# Patient Record
Sex: Female | Born: 1955 | Race: Asian | Hispanic: No | Marital: Married | Smoking: Never smoker
Health system: Southern US, Community
[De-identification: ages and names within clinical notes are randomized; demographics above are authoritative.]

## PROBLEM LIST (undated history)

## (undated) DIAGNOSIS — R51 Headache: Secondary | ICD-10-CM

## (undated) DIAGNOSIS — R7989 Other specified abnormal findings of blood chemistry: Secondary | ICD-10-CM

## (undated) DIAGNOSIS — E871 Hypo-osmolality and hyponatremia: Secondary | ICD-10-CM

## (undated) DIAGNOSIS — E785 Hyperlipidemia, unspecified: Secondary | ICD-10-CM

## (undated) DIAGNOSIS — Z789 Other specified health status: Secondary | ICD-10-CM

## (undated) DIAGNOSIS — S4990XA Unspecified injury of shoulder and upper arm, unspecified arm, initial encounter: Secondary | ICD-10-CM

## (undated) DIAGNOSIS — E119 Type 2 diabetes mellitus without complications: Secondary | ICD-10-CM

## (undated) DIAGNOSIS — R519 Headache, unspecified: Secondary | ICD-10-CM

## (undated) HISTORY — DX: Headache, unspecified: R51.9

## (undated) HISTORY — DX: Hemochromatosis, unspecified: E83.119

## (undated) HISTORY — DX: Other specified abnormal findings of blood chemistry: R79.89

## (undated) HISTORY — DX: Unspecified injury of shoulder and upper arm, unspecified arm, initial encounter: S49.90XA

## (undated) HISTORY — DX: Type 2 diabetes mellitus without complications: E11.9

## (undated) HISTORY — DX: Hypo-osmolality and hyponatremia: E87.1

## (undated) HISTORY — DX: Headache: R51

## (undated) HISTORY — DX: Hyperlipidemia, unspecified: E78.5

## (undated) HISTORY — PX: ANKLE SURGERY: SHX546

---

## 2015-01-13 ENCOUNTER — Encounter: Payer: Self-pay | Admitting: Emergency Medicine

## 2015-01-13 ENCOUNTER — Observation Stay
Admission: EM | Admit: 2015-01-13 | Discharge: 2015-01-15 | Disposition: A | Discharge status: Home / Self Care | Payer: Medicaid Other | Attending: Internal Medicine | Admitting: Internal Medicine

## 2015-01-13 ENCOUNTER — Emergency Department: Payer: Self-pay

## 2015-01-13 DIAGNOSIS — Z79899 Other long term (current) drug therapy: Secondary | ICD-10-CM | POA: Insufficient documentation

## 2015-01-13 DIAGNOSIS — R51 Headache: Secondary | ICD-10-CM | POA: Insufficient documentation

## 2015-01-13 DIAGNOSIS — R55 Syncope and collapse: Secondary | ICD-10-CM | POA: Insufficient documentation

## 2015-01-13 DIAGNOSIS — G8929 Other chronic pain: Secondary | ICD-10-CM | POA: Insufficient documentation

## 2015-01-13 DIAGNOSIS — Z7982 Long term (current) use of aspirin: Secondary | ICD-10-CM | POA: Insufficient documentation

## 2015-01-13 DIAGNOSIS — M25512 Pain in left shoulder: Secondary | ICD-10-CM | POA: Insufficient documentation

## 2015-01-13 DIAGNOSIS — R42 Dizziness and giddiness: Secondary | ICD-10-CM | POA: Insufficient documentation

## 2015-01-13 DIAGNOSIS — R531 Weakness: Principal | ICD-10-CM | POA: Insufficient documentation

## 2015-01-13 DIAGNOSIS — Z9109 Other allergy status, other than to drugs and biological substances: Secondary | ICD-10-CM | POA: Insufficient documentation

## 2015-01-13 DIAGNOSIS — R52 Pain, unspecified: Secondary | ICD-10-CM

## 2015-01-13 DIAGNOSIS — M549 Dorsalgia, unspecified: Secondary | ICD-10-CM | POA: Insufficient documentation

## 2015-01-13 DIAGNOSIS — I639 Cerebral infarction, unspecified: Secondary | ICD-10-CM | POA: Diagnosis present

## 2015-01-13 DIAGNOSIS — Z880 Allergy status to penicillin: Secondary | ICD-10-CM | POA: Insufficient documentation

## 2015-01-13 DIAGNOSIS — W19XXXA Unspecified fall, initial encounter: Secondary | ICD-10-CM | POA: Insufficient documentation

## 2015-01-13 DIAGNOSIS — M79602 Pain in left arm: Secondary | ICD-10-CM | POA: Insufficient documentation

## 2015-01-13 HISTORY — DX: Other specified health status: Z78.9

## 2015-01-13 LAB — CBC WITH DIFFERENTIAL/PLATELET
BASOS PCT: 0 %
Basophils Absolute: 0 10*3/uL (ref 0–0.1)
EOS PCT: 2 %
Eosinophils Absolute: 0.1 10*3/uL (ref 0–0.7)
HCT: 44.1 % (ref 35.0–47.0)
Hemoglobin: 14.6 g/dL (ref 12.0–16.0)
LYMPHS PCT: 38 %
Lymphs Abs: 2.4 10*3/uL (ref 1.0–3.6)
MCH: 30.8 pg (ref 26.0–34.0)
MCHC: 33.1 g/dL (ref 32.0–36.0)
MCV: 93 fL (ref 80.0–100.0)
Monocytes Absolute: 0.5 10*3/uL (ref 0.2–0.9)
Monocytes Relative: 8 %
NEUTROS PCT: 52 %
Neutro Abs: 3.3 10*3/uL (ref 1.4–6.5)
Platelets: 149 10*3/uL — ABNORMAL LOW (ref 150–440)
RBC: 4.74 MIL/uL (ref 3.80–5.20)
RDW: 12.7 % (ref 11.5–14.5)
WBC: 6.3 10*3/uL (ref 3.6–11.0)

## 2015-01-13 LAB — URINALYSIS COMPLETE WITH MICROSCOPIC (ARMC ONLY)
BACTERIA UA: NONE SEEN
BILIRUBIN URINE: NEGATIVE
GLUCOSE, UA: NEGATIVE mg/dL
HGB URINE DIPSTICK: NEGATIVE
KETONES UR: NEGATIVE mg/dL
Leukocytes, UA: NEGATIVE
NITRITE: NEGATIVE
Protein, ur: NEGATIVE mg/dL
Specific Gravity, Urine: 1.017 (ref 1.005–1.030)
pH: 6 (ref 5.0–8.0)

## 2015-01-13 LAB — COMPREHENSIVE METABOLIC PANEL
ALT: 23 U/L (ref 14–54)
AST: 25 U/L (ref 15–41)
Albumin: 4.2 g/dL (ref 3.5–5.0)
Alkaline Phosphatase: 120 U/L (ref 38–126)
Anion gap: 8 (ref 5–15)
BUN: 11 mg/dL (ref 6–20)
CO2: 27 mmol/L (ref 22–32)
Calcium: 9.4 mg/dL (ref 8.9–10.3)
Chloride: 107 mmol/L (ref 101–111)
Creatinine, Ser: 0.67 mg/dL (ref 0.44–1.00)
GFR calc Af Amer: >60 mL/min (ref >60)
Glucose, Bld: 85 mg/dL (ref 65–99)
Potassium: 3.6 mmol/L (ref 3.5–5.1)
Sodium: 142 mmol/L (ref 135–145)
Total Bilirubin: 0.8 mg/dL (ref 0.3–1.2)
Total Protein: 7.3 g/dL (ref 6.5–8.1)

## 2015-01-13 LAB — TROPONIN I

## 2015-01-13 MED ORDER — SIMVASTATIN 20 MG PO TABS
20.0000 mg | ORAL_TABLET | Freq: Every day | ORAL | Status: DC
  Administered 2015-01-13 – 2015-01-14 (×2): 20 mg via ORAL
  Filled 2015-01-13 (×2): qty 1

## 2015-01-13 MED ORDER — ASPIRIN 81 MG PO CHEW
CHEWABLE_TABLET | ORAL | Status: AC
  Administered 2015-01-13: 324 mg via ORAL
  Filled 2015-01-13: qty 4

## 2015-01-13 MED ORDER — ASPIRIN EC 81 MG PO TBEC
81.0000 mg | DELAYED_RELEASE_TABLET | Freq: Every day | ORAL | Status: DC
  Administered 2015-01-14 – 2015-01-15 (×2): 81 mg via ORAL
  Filled 2015-01-13 (×2): qty 1

## 2015-01-13 MED ORDER — ONDANSETRON HCL 4 MG PO TABS: 4.0000 mg | ORAL_TABLET | Freq: Four times a day (QID) | ORAL | Status: DC | PRN

## 2015-01-13 MED ORDER — ACETAMINOPHEN 325 MG PO TABS
650.0000 mg | ORAL_TABLET | Freq: Four times a day (QID) | ORAL | Status: DC | PRN
  Administered 2015-01-14 – 2015-01-15 (×2): 650 mg via ORAL
  Filled 2015-01-13 (×2): qty 2

## 2015-01-13 MED ORDER — ASPIRIN 81 MG PO CHEW
324.0000 mg | CHEWABLE_TABLET | Freq: Once | ORAL | Status: AC
  Administered 2015-01-13: 324 mg via ORAL

## 2015-01-13 MED ORDER — ONDANSETRON HCL 4 MG/2ML IJ SOLN: 4.0000 mg | Freq: Four times a day (QID) | INTRAMUSCULAR | Status: DC | PRN

## 2015-01-13 MED ORDER — ACETAMINOPHEN 650 MG RE SUPP: 650.0000 mg | Freq: Four times a day (QID) | RECTAL | Status: DC | PRN

## 2015-01-13 NOTE — ED Notes (Signed)
Fell last pm,, unknown reason, no visual changes states was walking in house and passed out, her husband states he found her on the floor, she was sleeping and he had to wake her up, pt states has felt tired past few days, also has pain on left side of her head and face, some pain left lateral rib but non tender, had to use language line for assessment

## 2015-01-13 NOTE — ED Provider Notes (Addendum)
[facility] Emergency Department Provider Note  ____________________________________________  Time seen: 1235  I have reviewed the triage vital signs and the nursing notes.   HISTORY  Chief Complaint Headache  history is obtained via language line.  HPI Tammy Lam is a 59 y.o. female patient is brought in today by husband with complaint of left-sided headache. Through the language line history was obtained. Patient states she was walking through the house and suddenly everything became black. Husband found her lying on the floor on her side.She states prior to this she had felt weak for 3 or 4 days. He does not have a primary care doctor as she is only been in this area [place]in [place] 2 [place]. She denies any vision changes. She states that the left side of her face feels different and that her left hand and left leg feel weak. She denies any nausea or vomiting. She has not taken any medication over-the-counter at home since her headache began. She denies any health problems. She denies any family history of health problems such as stroke. She denies any previous headaches such as what she is experiencing currently. Currently she rates her headache as an 8 out of 10.   History reviewed. No pertinent past medical history.  There are no active problems to display for this patient.   No past surgical history on file.  No current outpatient prescriptions on file.  Allergies Benadryl  No family history on file.  Social History History  Substance Use Topics  . Smoking status: Never Smoker   . Smokeless tobacco: Not on file  . Alcohol Use: No    Review of Systems Constitutional: No fever/chills Eyes: No visual changes. ENT: No sore throat. Cardiovascular: Denies chest pain. Respiratory: Denies shortness of breath. Gastrointestinal: No abdominal pain.  No nausea, no vomiting.  No diarrhea. . Genitourinary: Negative for dysuria. Musculoskeletal:  Negative for back pain. Positive left hip pain Skin: Negative for rash. Neurological: Positive left side  headaches, focal weakness positive left side and numbness left upper and lower extremity.  10-point ROS otherwise negative.  ____________________________________________   PHYSICAL EXAM:  VITAL SIGNS: ED Triage Vitals  Enc Vitals Group     BP 01/13/15 1109 122/76 mmHg     Pulse Rate 01/13/15 1109 62     Resp 01/13/15 1109 18     Temp 01/13/15 1109 98.5 F (36.9 C)     Temp Source 01/13/15 1109 Oral     SpO2 01/13/15 1109 98 %     Weight 01/13/15 1109 135 lb (61.236 kg)     Height 01/13/15 1109 5\' 5"  (1.651 m)     Head Cir --      Peak Flow --      Pain Score 01/13/15 1110 8     Pain Loc --      Pain Edu? --      Excl. in GC? --     Constitutional: Alert and oriented. Well appearing and in no acute distress. Eyes: Conjunctivae are normal. PERRL. EOMI. Head: Atraumatic. Nose: No congestion/rhinnorhea. Neck: No stridor.  Nontender Cervical spine on palpation Hematological/Lymphatic/Immunilogical: No cervical lymphadenopathy. Cardiovascular: Normal rate, regular rhythm. Grossly normal heart sounds.  Good peripheral circulation. Respiratory: Normal respiratory effort.  No retractions. Lungs CTAB. Nontender to palpation bilateral ribs,  no ecchymosis noted Gastrointestinal: Soft and nontender. No distention. No abdominal bruits. No CVA tenderness. Musculoskeletal: Left hip tender lateral aspect, no gross deformity is noted. Range of motion is  restricted secondary to pain, no ecchymosis is noted  No joint effusions. Moves upper extremities without restriction Neurologic:  Normal speech and language. Cranial nerves II through XII grossly intact with the exception of patient's small left side is not symmetrical. Speech is normal. Gait was not tested. Patient answers questions and answers appropriately Skin:  Skin is warm, dry and intact. No rash noted. Psychiatric: Mood and  affect are normal. Speech and behavior are normal.  ____________________________________________   LABS (all labs ordered are listed, but only abnormal results are displayed)  Labs Reviewed  COMPREHENSIVE METABOLIC PANEL  CBC WITH DIFFERENTIAL/PLATELET  URINALYSIS COMPLETEWITH MICROSCOPIC ([facility])   TROPONIN I   ____________________________________________  EKG  EKG interpreted by me, Dr. Sharman Cheek Normal sinus rhythm rate of 67 normal axis intervals QRS ST and T segments ____________________________________________  RADIOLOGY  CT scan head shows no acute intracranial abnormality per radiologist X-ray left hip unremarkable ____________________________________________   PROCEDURES  Procedure(s) performed: None  Critical Care performed: No  ____________________________________________   INITIAL IMPRESSION / ASSESSMENT AND PLAN / ED COURSE  Pertinent labs & imaging results that were available during my care of the patient were reviewed by me and considered in my medical decision making (see chart for details).  Initial impression r/o  CVA . Patient was transferred to major for further evaluation, discussed with Dr. Scotty Court ____________________________________________   FINAL CLINICAL IMPRESSION(S) / ED DIAGNOSES  Final diagnoses:  None      Tommi Rumps, PA-C 01/13/15 1338  Sharman Cheek, MD 01/13/15 1438  Sharman Cheek, MD 01/13/15 [phone]

## 2015-01-13 NOTE — ED Notes (Signed)
Mechanical fall last week, hit head on floor, began headache and dizzyness last night

## 2015-01-13 NOTE — H&P (Signed)
[facility] [place] [facility]   PATIENT NAME: Tammy Lam    MR#:  [id number]  DATE OF BIRTH:  16-Nov-1955  DATE OF ADMISSION:  01/13/2015  PRIMARY CARE PHYSICIAN: No primary care provider on file.   REQUESTING/REFERRING PHYSICIAN: Dr. Scotty Court  CHIEF COMPLAINT:   Chief Complaint  Patient presents with  . Headache    fell one week [place] and hit head, headache started last nigh, possible LOC with original fall   Headache for a few days and s/p fall/syncope today with left sided weakness.    HISTORY OF PRESENT ILLNESS:  Tailey Top  is a 59 y.o. [place] female with no past medical history who presents to the hospital after a fall at home. As per the husband who gives most of the history patient was in usual state of health but developed some headache over the past couple days. Patient also has been having some weakness to her left side for the past couple days. Patient this morning was ambulating and felt dizzy and then fell. As per the husband patient was not arousable for about 3-4 minutes although was breathing. She was brought to the emergency room for further evaluation patient continues to have some left-sided tingling and weakness and suspected to have a CVA and therefore is being admitted for further evaluation.  Patient complains of a headache which is nonradiating in the center of her head.  Patient did have some associated nausea but did not vomit.  PAST MEDICAL HISTORY:  History reviewed. No pertinent past medical history.  PAST SURGICAL HISTORY:  No past surgical history on file.  SOCIAL HISTORY:   History  Substance Use Topics  . Smoking status: Never Smoker   . Smokeless tobacco: Not on file  . Alcohol Use: No    FAMILY HISTORY:   Father died during the War.  Mother is alive and healthy.  No family hx of CVA, heart disease or cancer.   DRUG ALLERGIES:   Allergies  Allergen Reactions  . Benadryl [Diphenhydramine Hcl] Itching  .  Penicillins Itching    REVIEW OF SYSTEMS:   Review of Systems  Constitutional: Negative for fever and weight loss.  HENT: Negative for congestion, nosebleeds and tinnitus.   Eyes: Negative for blurred vision, double vision and redness.  Respiratory: Negative for cough, hemoptysis and shortness of breath.   Cardiovascular: Negative for chest pain, orthopnea, leg swelling and PND.  Gastrointestinal: Negative for nausea, vomiting, abdominal pain, diarrhea and melena.  Genitourinary: Negative for dysuria, urgency and hematuria.  Musculoskeletal: Negative for joint pain and falls.  Neurological: Positive for focal weakness (left sided) and headaches. Negative for dizziness, tingling, sensory change, speech change, seizures and weakness.  Endo/Heme/Allergies: Negative for polydipsia. Does not bruise/bleed easily.  Psychiatric/Behavioral: Negative for depression and memory loss. The patient is not nervous/anxious.     MEDICATIONS AT HOME:   Prior to Admission medications   Not on File    Pt. Is no medications presently.    VITAL SIGNS:  Blood pressure 119/72, pulse 70, temperature 98.5 F (36.9 C), temperature source Oral, resp. rate 18, height  (1.651 m), weight 61.236 kg (135 lb), SpO2 98 %.  PHYSICAL EXAMINATION:  Physical Exam  GENERAL:  59 y.o.-year-old patient lying in the bed with no acute distress.  EYES: Pupils equal, round, reactive to light and accommodation. No scleral icterus. Extraocular muscles intact.  HEENT: Head atraumatic, normocephalic. Oropharynx and nasopharynx clear. No oropharyngeal erythema, moist oral mucosa  NECK:  Supple, no jugular venous distention. No thyroid enlargement, no tenderness.  LUNGS: Normal breath sounds bilaterally, no wheezing, rales, rhonchi. No use of accessory muscles of respiration.  CARDIOVASCULAR: S1, S2 normal. No murmurs, rubs, or gallops.  ABDOMEN: Soft, nontender, nondistended. Bowel sounds present. No organomegaly or mass.   EXTREMITIES: No pedal edema, cyanosis, or clubbing. + 2 pedal & radial pulses b/l.   NEUROLOGIC: Cranial nerves II through XII are intact. Left upper & Lower ext. Weakness.  3/5 strength in LUE.  4/5 Strength in LLE.  (-) babinski's b/l.  Reflexes + 2 b/l.   PSYCHIATRIC: The patient is alert and oriented x 3. Good affect.  SKIN: No obvious rash, lesion, or ulcer.   LABORATORY PANEL:   CBC  Recent Labs Lab 01/13/15 1346  WBC 6.3  HGB 14.6  HCT 44.1  PLT 149*   ------------------------------------------------------------------------------------------------------------------  Chemistries   Recent Labs Lab 01/13/15 1346  NA 142  K 3.6  CL 107  CO2 27  GLUCOSE 85  BUN 11  CREATININE 0.67  CALCIUM 9.4  AST 25  ALT 23  ALKPHOS 120  BILITOT 0.8   ------------------------------------------------------------------------------------------------------------------  Cardiac Enzymes  Recent Labs Lab 01/13/15 1346  TROPONINI <0.03   ------------------------------------------------------------------------------------------------------------------  RADIOLOGY:  Ct Head Wo Contrast  01/13/2015   CLINICAL DATA:  Larey SeatFell 1 week ago and hit head. Headache starting last night.  EXAM: CT HEAD WITHOUT CONTRAST  TECHNIQUE: Contiguous axial images were obtained from the base of the skull through the vertex without contrast.  COMPARISON:  None  FINDINGS: Normal appearance of the intracranial structures. No evidence for acute hemorrhage, mass lesion, midline shift, hydrocephalus or large infarct. No acute bony abnormality. The visualized sinuses are clear.  IMPRESSION: No acute intracranial abnormality.   Electronically Signed   By: Richarda OverlieAdam  Henn M.D.   On: 01/13/2015 11:49   Dg Hip Unilat With Pelvis 2-3 Views Left  01/13/2015   CLINICAL DATA:  Left side weakness  EXAM: LEFT HIP (WITH PELVIS) 2-3 VIEWS  COMPARISON:  None.  FINDINGS: Three views of the left hip submitted. No acute fracture or  subluxation. Bilateral hip joints are symmetrical in appearance.  IMPRESSION: Negative.   Electronically Signed   By: Natasha MeadLiviu  Pop M.D.   On: 01/13/2015 14:09     IMPRESSION AND PLAN:   59 year old [place] female with no significant past medical history who just moved from [place] 2 [place] ago presents to the hospital after a fall and left-sided weakness.  #1 TIA/CVA - this is a suspected diagnosis given the patient's acute neurologic symptoms. On exam she does have some weakness on her left side. - Although his CT head on admission is negative. - I will observe her on telemetry, follow every 4 hours neuro checks, we'll do an MRI of her brain, get a carotid duplex, and check a two-dimensional echocardiogram.   - I will start on a baby aspirin and low-dose statin. - We'll also get a physical therapy evaluation.     All the records are reviewed and case discussed with ED provider. Management plans discussed with the patient, family and they are in agreement.  CODE STATUS: Full   TOTAL TIME TAKING CARE OF THIS PATIENT: 45  minutes.    Houston SirenSAINANI,Dareld Mcauliffe J M.D on 01/13/2015 at 3:11 PM  Between 7am to 6pm - Pager - [phone]  After 6pm go to [email or web address] - password [email or web address] [facility]  [facility]  Office  [phone]  CC: Primary care physician; No  primary care provider on file.

## 2015-01-13 NOTE — ED Notes (Signed)
Patient transported to CT 

## 2015-01-14 ENCOUNTER — Observation Stay
Admit: 2015-01-14 | Discharge: 2015-01-14 | Disposition: A | Discharge status: Home / Self Care | Payer: Self-pay | Attending: Specialist | Admitting: Specialist

## 2015-01-14 ENCOUNTER — Observation Stay: Payer: Self-pay

## 2015-01-14 LAB — CBC
HEMATOCRIT: 43.7 % (ref 35.0–47.0)
HEMOGLOBIN: 14.6 g/dL (ref 12.0–16.0)
MCH: 30.8 pg (ref 26.0–34.0)
MCHC: 33.4 g/dL (ref 32.0–36.0)
MCV: 92.3 fL (ref 80.0–100.0)
Platelets: 152 10*3/uL (ref 150–440)
RBC: 4.74 MIL/uL (ref 3.80–5.20)
RDW: 12.5 % (ref 11.5–14.5)
WBC: 5.2 10*3/uL (ref 3.6–11.0)

## 2015-01-14 LAB — BASIC METABOLIC PANEL
ANION GAP: 8 (ref 5–15)
BUN: 16 mg/dL (ref 6–20)
CALCIUM: 9.5 mg/dL (ref 8.9–10.3)
CO2: 27 mmol/L (ref 22–32)
CREATININE: 0.79 mg/dL (ref 0.44–1.00)
Chloride: 106 mmol/L (ref 101–111)
GFR calc Af Amer: >60 mL/min (ref >60)
GFR calc non Af Amer: >60 mL/min (ref >60)
Glucose, Bld: 93 mg/dL (ref 65–99)
Potassium: 3.9 mmol/L (ref 3.5–5.1)
Sodium: 141 mmol/L (ref 135–145)

## 2015-01-14 NOTE — Progress Notes (Signed)
PT Cancellation Note  Patient Details Name: Harless LittenDiep Baldi MRN: [id number] DOB: 12/20/1955   Cancelled Treatment:    Reason Eval/Treat Not Completed: Patient at procedure or test/unavailable (patient leaving unit for MRI/dopplers; will re-attempt at later time/date as patient available and medically appropriate.)  Mavryk Pino H. Manson PasseyBrown, PT, DPT 01/14/2015, 8:57 AM [phone]

## 2015-01-14 NOTE — Progress Notes (Signed)
*  PRELIMINARY RESULTS* Echocardiogram 2D Echocardiogram has been performed.  Tammy Lam 01/14/2015, 11:27 AM

## 2015-01-14 NOTE — Evaluation (Addendum)
Physical Therapy Evaluation Patient Details Name: Tammy Lam MRN: [id number] DOB: 1955-12-14 Today's Date: 01/14/2015   History of Present Illness  presented to ER wtih headache x2 days status post fall/syncopal episode with L-sided weakness; admitted for acute CVA. Head CT negative for acute change, L hip/pelvis negative for acute injury; MRI pending.  Clinical Impression  Upon evaluation, patient alert and oriented to all information; follows commands and demonstrates good insight/safety awareness.  Mild weakness of L hemi-body evident, strength at least 4/5 throughout with frequent episodes of give-way weakness noted.  Isolated strength vs. functional strength somewhat inconsistent throughout session.  Reports constant paresthesia throughout L UE/LE, but able to localize 100% without extinction.  Good tone and motor control noted; able to utilize bilat UEs to don socks with good bimanual coordination.  Able to complete bed mobility and sit/stand indep; basic transfers and gait (50') with L HHA, cga/min assist; stairs x6 with single rail, cga/min assist.  Patient strongly favoring L LE with noted decrease in stance time/weight acceptance, but no overt buckling or LOB.  Performance improved (in fluidity, symmetry and overall safety) with use of RW.  Do recommend continued use of RW for all mobility at this time; patient in agreement. Would benefit from skilled PT to address above deficits and promote optimal return to PLOF; recommend transition to home with outpatient PT services upon discharge from acute hospitalization.  Patient/family aware of recommendations and in agreement with plan.  [place] interpreter, Colin Benton (ID #[id number]) , with Language Line utilized throughout evaluation.    Follow Up Recommendations Outpatient PT    Equipment Recommendations  Rolling walker with 5" wheels    Recommendations for Other Services       Precautions / Restrictions Precautions Precautions:  Fall Restrictions Weight Bearing Restrictions: No      Mobility  Bed Mobility Overal bed mobility: Independent                Transfers Overall transfer level: Independent                  Ambulation/Gait Ambulation/Gait assistance: Min guard;Min assist Ambulation Distance (Feet): 50 Feet Assistive device: 1 person hand held assist       General Gait Details: partial step through gait pattern with decreased stance time/weight acceptance L LE; decreaed step height/length R LE.  Stairs Stairs: Yes Stairs assistance: Min assist Stair Management: One rail Right Number of Stairs: 6 General stair comments: step to gait pattern, ascending with R LE, descending with L LE  Wheelchair Mobility    Modified Rankin (Stroke Patients Only)       Balance   Sitting-balance support: No upper extremity supported;Feet supported Sitting balance-Leahy Scale: Normal     Standing balance support: No upper extremity supported Standing balance-Leahy Scale: Fair                               Pertinent Vitals/Pain Pain Assessment: No/denies pain    Home Living Family/patient expects to be discharged to:: Private residence Living Arrangements: Spouse/significant other (husband) Available Help at Discharge: Family Type of Home: House Home Access: Stairs to enter   Secretary/administrator of Steps: 3 Home Layout: Two level;Bed/bath upstairs (half bath available on main level of home) Home Equipment: None      Prior Function Level of Independence: Independent               Hand Dominance   Dominant Hand:  Right    Extremity/Trunk Assessment   Upper Extremity Assessment: Overall WFL for tasks assessed (L UE with mild weakness, 4/5, some degree of give-way weakness; reports constant paresthesia throughout extremity, but able to localize 100% without extinction)           Lower Extremity Assessment: Overall WFL for tasks assessed (L LE with  mild weakness, 4/5, some degree of give-way weakness; reports constant paresthesia throughout extremity, but able to localize 100% without extinction)      Cervical / Trunk Assessment: Normal  Communication   Communication: No difficulties  Cognition Arousal/Alertness: Awake/alert Behavior During Therapy: WFL for tasks assessed/performed Overall Cognitive Status: Within Functional Limits for tasks assessed                      General Comments      Exercises Other Exercises Other Exercises: Gait x200' with RW, cga/close sup-progresses towards consistent reciprocal stepping pattern with improved gait fluidity, symmetry and overall safety.  Patient subjectively reporting optimal comfort/confidence with use of RW at this time.  (8 minutes)      Assessment/Plan    PT Assessment Patient needs continued PT services  PT Diagnosis Difficulty walking;Generalized weakness   PT Problem List Decreased strength;Decreased balance;Decreased mobility;Decreased coordination  PT Treatment Interventions DME instruction;Gait training;Stair training;Functional mobility training;Therapeutic activities;Therapeutic exercise;Balance training;Patient/family education   PT Goals (Current goals can be found in the Care Plan section) Acute Rehab PT Goals Patient Stated Goal: to get better PT Goal Formulation: With patient/family Time For Goal Achievement: 01/28/15 Potential to Achieve Goals: Good    Frequency Min 2X/week (if MRI significant for acute CVA, will increase frequency to QD as appropriate)   Barriers to discharge        Co-evaluation               End of Session Equipment Utilized During Treatment: Gait belt Activity Tolerance: Patient tolerated treatment well Patient left: in bed;with call bell/phone within reach;with bed alarm set;with family/visitor present Nurse Communication: Mobility status    Functional Limitation: Mobility: Walking and moving around Mobility:  Walking and Moving Around Current Status ([id number](G8978): At least 20 percent but less than 40 percent impaired, limited or restricted Mobility: Walking and Moving Around Goal Status [phone]([id number]): 0 percent impaired, limited or restricted    Time: 1342-1409 PT Time Calculation (min) (ACUTE ONLY): 27 min   Charges:   PT Evaluation $Initial PT Evaluation Tier I: 1 Procedure PT Treatments $Gait Training: 8-22 mins   PT G Codes:   PT G-Codes **NOT FOR INPATIENT CLASS** Functional Limitation: Mobility: Walking and moving around Mobility: Walking and Moving Around Current Status ([id number](G8978): At least 20 percent but less than 40 percent impaired, limited or restricted Mobility: Walking and Moving Around Goal Status [phone](G8979): 0 percent impaired, limited or restricted   Birtie Fellman H. Manson PasseyBrown, PT, DPT 01/14/2015, 3:01 PM [phone]  Addendum:  Stair performance added to evaluation.   Jaydynn Wolford H. Manson PasseyBrown, PT, DPT 01/14/2015, 3:04 PM [phone]

## 2015-01-14 NOTE — Progress Notes (Signed)
Took over care from [employer]. Gave patient tylenol for complaints of a headache and pain down the left arm rating 3-4/10, per patient. No other complaints at this time. Sinus rhythm with 1st degree block on tele. Will continue to monitor.

## 2015-01-14 NOTE — Progress Notes (Signed)
[facility] - [place] at [facility]   PATIENT NAME: Tammy Lam    MR#:  [id number]  DATE OF BIRTH:  Jan 16, 1956  SUBJECTIVE:   Interview performed with the assistance of telephone interpreter. Patient reports that she has had left upper and lower extremity weakness for 2 months. Several days prior to this admission she developed a headache, worsening of the weakness and pain in the left shoulder. On the morning of admission she became acutely dizzy and then fell. Presentation she had left-sided tingling and weakness was thought to have had a CVA and was admitted for further evaluation. Her CT and MRI are negative for stroke. Today she continues to have the symptoms, though the headache seems to be improved. She denies vision change, confusion, loss of consciousness.  REVIEW OF SYSTEMS:   Review of Systems  Constitutional: Negative for fever.  Eyes: Negative for blurred vision and double vision.  Respiratory: Negative for shortness of breath.   Cardiovascular: Negative for chest pain, palpitations and leg swelling.  Gastrointestinal: Negative for nausea, vomiting and abdominal pain.  Genitourinary: Negative for dysuria.  Musculoskeletal: Positive for falls.  Neurological: Positive for tingling, sensory change, focal weakness and headaches. Negative for loss of consciousness.    DRUG ALLERGIES:   Allergies  Allergen Reactions  . Benadryl [Diphenhydramine Hcl] Itching  . Penicillins Itching    VITALS:  Blood pressure 116/70, pulse 61, temperature 98.5 F (36.9 C), temperature source Oral, resp. rate 17, height  (1.651 m), weight 60.873 kg (134 lb 3.2 oz), SpO2 97 %.  PHYSICAL EXAMINATION:  GENERAL:  59 y.o.-year-old patient lying in the bed with no acute distress.  EYES: Pupils equal, round, reactive to light and accommodation. No scleral icterus. Extraocular muscles intact.  HEENT: Head atraumatic, normocephalic. Oropharynx and nasopharynx clear.  NECK:   Supple, no jugular venous distention. No thyroid enlargement, no tenderness.  LUNGS: Normal breath sounds bilaterally, no wheezing, rales,rhonchi or crepitation. No use of accessory muscles of respiration.  CARDIOVASCULAR: S1, S2 normal. No murmurs, rubs, or gallops.  ABDOMEN: Soft, nontender, nondistended. Bowel sounds present. No organomegaly or mass.  EXTREMITIES: No pedal edema, cyanosis, or clubbing.  NEUROLOGIC: Left-sided facial droop, left arm and leg weakness, decreased sensation over the left face and left arm. PSYCHIATRIC: The patient is alert and oriented x 3.  SKIN: No obvious rash, lesion, or ulcer.    LABORATORY PANEL:   CBC  Recent Labs Lab 01/14/15 0454  WBC 5.2  HGB 14.6  HCT 43.7  PLT 152   ------------------------------------------------------------------------------------------------------------------  Chemistries   Recent Labs Lab 01/13/15 1346 01/14/15 0454  NA 142 141  K 3.6 3.9  CL 107 106  CO2 27 27  GLUCOSE 85 93  BUN 11 16  CREATININE 0.67 0.79  CALCIUM 9.4 9.5  AST 25  --   ALT 23  --   ALKPHOS 120  --   BILITOT 0.8  --    ------------------------------------------------------------------------------------------------------------------  Cardiac Enzymes  Recent Labs Lab 01/13/15 1346  TROPONINI <0.03   ------------------------------------------------------------------------------------------------------------------  RADIOLOGY:  Ct Head Wo Contrast  01/13/2015   CLINICAL DATA:  Larey Seat 1 week ago and hit head. Headache starting last night.  EXAM: CT HEAD WITHOUT CONTRAST  TECHNIQUE: Contiguous axial images were obtained from the base of the skull through the vertex without contrast.  COMPARISON:  None  FINDINGS: Normal appearance of the intracranial structures. No evidence for acute hemorrhage, mass lesion, midline shift, hydrocephalus or large infarct. No acute bony  abnormality. The visualized sinuses are clear.  IMPRESSION: No acute  intracranial abnormality.   Electronically Signed   By: Richarda OverlieAdam  Henn M.D.   On: 01/13/2015 11:49   Mr Brain Wo Contrast  01/14/2015   CLINICAL DATA:  Stroke. Fall 1 week ago with head injury. Left-sided weakness.  EXAM: MRI HEAD WITHOUT CONTRAST  TECHNIQUE: Multiplanar, multiecho pulse sequences of the brain and surrounding structures were obtained without intravenous contrast.  COMPARISON:  CT head 01/13/2015  FINDINGS: Ventricle size is normal.  Cerebral volume is normal  Pituitary normal in size. Cervical medullary junction normal. Paranasal sinuses are clear.  Negative for acute infarct. Scattered small hyperintensities in the frontal white matter left greater than right likely are due to chronic microvascular ischemia  Negative for hemorrhage or mass. No edema or shift of the midline structures.  IMPRESSION: Mild white matter changes most notably left frontal lobe, most likely due to chronic micro vascular ischemia  No acute abnormality.   Electronically Signed   By: Marlan Palauharles  Clark M.D.   On: 01/14/2015 14:23   [place] Carotid Bilateral  01/14/2015   CLINICAL DATA:  59 year old female  EXAM: BILATERAL CAROTID DUPLEX ULTRASOUND  TECHNIQUE: Wallace CullensGray scale imaging, color Doppler and duplex ultrasound were performed of bilateral carotid and vertebral arteries in the neck.  COMPARISON:  MRI 01/14/2015  FINDINGS: Criteria: Quantification of carotid stenosis is based on velocity parameters that correlate the residual internal carotid diameter with NASCET-based stenosis levels, using the diameter of the distal internal carotid lumen as the denominator for stenosis measurement.  The following velocity measurements were obtained:  RIGHT  ICA:  Systolic 84 cm/sec, Diastolic 33 cm/sec  CCA:  94 cm/sec  SYSTOLIC ICA/CCA RATIO:  0.9  ECA:  104 cm/sec  LEFT  ICA:  Systolic 82 cm/sec, Diastolic 23 cm/sec  CCA:  23 cm/sec  SYSTOLIC ICA/CCA RATIO:  0.7  ECA:  107 cm/sec  Right Brachial SBP: Not acquired  Left Brachial SBP: Not  acquired  RIGHT CAROTID ARTERY: Unremarkable appearance of the carotid system without significant atherosclerotic disease. Intermediate waveform of the CCA. Low resistance waveform of the ICA. No significant tortuosity of the carotid system.  RIGHT VERTEBRAL ARTERY: Antegrade flow with low resistance waveform.  LEFT CAROTID ARTERY: Unremarkable appearance of the carotid system without significant atherosclerotic disease. Intermediate waveform of the CCA. Low resistance waveform of the ICA. No significant tortuosity of the carotid system.  LEFT VERTEBRAL ARTERY:  Antegrade flow with low resistance waveform.  IMPRESSION: Color duplex indicates no significant plaque, with no hemodynamically significant stenosis by duplex criteria in the extracranial cerebrovascular circulation.  Signed,  Yvone NeuJaime S. Loreta AveWagner, DO  [facility]  [facility]   Electronically Signed   By: Gilmer MorJaime  Wagner D.O.   On: 01/14/2015 11:24   Dg Hip Unilat With Pelvis 2-3 Views Left  01/13/2015   CLINICAL DATA:  Left side weakness  EXAM: LEFT HIP (WITH PELVIS) 2-3 VIEWS  COMPARISON:  None.  FINDINGS: Three views of the left hip submitted. No acute fracture or subluxation. Bilateral hip joints are symmetrical in appearance.  IMPRESSION: Negative.   Electronically Signed   By: Natasha MeadLiviu  Pop M.D.   On: 01/13/2015 14:09    EKG:   Orders placed or performed during the hospital encounter of 01/13/15  . ED EKG  . ED EKG  . EKG 12-Lead  . EKG 12-Lead    ASSESSMENT AND PLAN:   #1 TIA/CVA: Symptoms suggestive of infarct however imaging does not  support. Her symptoms have been ongoing for 2 months. MRI is positive for left frontal lobe white matter change which would not be consistent with her symptoms. Carotid Dopplers are normal. 2-D echocardiogram normal. I have asked for neurology consultation. Physical therapy recommends continued outpatient physical therapy.  #2 fall: Left hip and pelvis x-ray  negative for fracture. Due to left-sided weakness.   All the records are reviewed and case discussed with Care Management/Social Workerr. Management plans discussed with the patient, family and they are in agreement.  CODE STATUS: Full  TOTAL TIME TAKING CARE OF THIS PATIENT: 40 minutes.   POSSIBLE D/C IN 1 DAYS, DEPENDING ON CLINICAL CONDITION.   Elby Showers M.D on 01/14/2015 at 4:32 PM  Between 7am to 6pm - Pager - [phone]  After 6pm go to [email or web address] - password [email or web address] [facility]  [facility]  Office  [phone]  CC: Primary care physician; No primary care provider on file.

## 2015-01-15 DIAGNOSIS — I6339 Cerebral infarction due to thrombosis of other cerebral artery: Secondary | ICD-10-CM

## 2015-01-15 DIAGNOSIS — R531 Weakness: Secondary | ICD-10-CM | POA: Diagnosis present

## 2015-01-15 MED ORDER — ASPIRIN 81 MG PO TBEC: 81.0000 mg | DELAYED_RELEASE_TABLET | Freq: Every day | ORAL | Status: DC

## 2015-01-15 NOTE — Discharge Instructions (Signed)

## 2015-01-15 NOTE — Progress Notes (Signed)
Pt discharged home, interpreter line was used for discharge instructions, IV site discontinued, bleeding controlled, tele monitor turned in, pt and spouse verbalized understanding of DC instructions with use of interpreter, left hospital in car together

## 2015-01-15 NOTE — Care Management (Signed)
Provided patient with list of local physicians accepting new patients.

## 2015-01-15 NOTE — Consult Note (Signed)
CC: L sided weakness   HPI: Tammy Lam is an 59 y.o. female [place] female with left upper and lower extremity weakness for 2 months. Several days prior to this admission she developed a headache, worsening of the weakness and pain in the left shoulder. On the morning of admission she became acutely dizzy and then fell. Presentation she had left-sided tingling and weakness was thought to have had a CVA and was admitted for further evaluation. Her CT and MRI are negative for stroke. Today she continues to have the symptoms, though the headache seems to be improved. She denies vision change, confusion, loss of consciousness.  Currently able to ambulate and did not have any weakness on L side.   Past Medical History  Diagnosis Date  . Medical history non-contributory     Past Surgical History  Procedure Laterality Date  . No past surgeries      History reviewed. No pertinent family history.  Social History:  reports that she has never smoked. She has never used smokeless tobacco. She reports that she does not drink alcohol or use illicit drugs.  Allergies  Allergen Reactions  . Benadryl [Diphenhydramine Hcl] Itching  . Penicillins Itching    Medications: I have reviewed the patient's current medications.  ROS: Unable to obtain due to language barrier  Physical Examination: Blood pressure 124/69, pulse 61, temperature 97.9 F (36.6 C), temperature source Oral, resp. rate 20, height  (1.651 m), weight 60.873 kg (134 lb 3.2 oz), SpO2 96 %.    Neurological Examination Mental Status: Alert, oriented, thought content appropriate.  Speech fluent without evidence of aphasia.  Able to follow 3 step commands without difficulty. Cranial Nerves: II: Discs flat bilaterally; Visual fields grossly normal, pupils equal, round, reactive to light and accommodation III,IV, VI: ptosis not present, extra-ocular motions intact bilaterally V,VII: smile symmetric, facial light touch sensation  normal bilaterally VIII: hearing normal bilaterally IX,X: gag reflex present XI: bilateral shoulder shrug XII: midline tongue extension Motor: Right : Upper extremity   5/5    Left:     Upper extremity   5/5  Lower extremity   5/5     Lower extremity   5/5 Tone and bulk:normal tone throughout; no atrophy noted Sensory: Pinprick and light touch intact throughout, bilaterally Deep Tendon Reflexes: 1+ and symmetric throughout Plantars: Right: downgoing   Left: downgoing Cerebellar: normal finger-to-nose, normal rapid alternating movements and normal heel-to-shin test Gait: normal gait and station      Laboratory Studies:   Basic Metabolic Panel:  Recent Labs Lab 01/13/15 1346 01/14/15 0454  NA 142 141  K 3.6 3.9  CL 107 106  CO2 27 27  GLUCOSE 85 93  BUN 11 16  CREATININE 0.67 0.79  CALCIUM 9.4 9.5    Liver Function Tests:  Recent Labs Lab 01/13/15 1346  AST 25  ALT 23  ALKPHOS 120  BILITOT 0.8  PROT 7.3  ALBUMIN 4.2   No results for input(s): LIPASE, AMYLASE in the last 168 hours. No results for input(s): AMMONIA in the last 168 hours.  CBC:  Recent Labs Lab 01/13/15 1346 01/14/15 0454  WBC 6.3 5.2  NEUTROABS 3.3  --   HGB 14.6 14.6  HCT 44.1 43.7  MCV 93.0 92.3  PLT 149* 152    Cardiac Enzymes:  Recent Labs Lab 01/13/15 1346  TROPONINI <0.03    BNP: Invalid input(s): POCBNP  CBG: No results for input(s): GLUCAP in the last 168 hours.  Microbiology: No results  found for this or any previous visit.  Coagulation Studies: No results for input(s): LABPROT, INR in the last 72 hours.  Urinalysis:  Recent Labs Lab 01/13/15 1536  COLORURINE YELLOW*  LABSPEC 1.017  PHURINE 6.0  GLUCOSEU NEGATIVE  HGBUR NEGATIVE  BILIRUBINUR NEGATIVE  KETONESUR NEGATIVE  PROTEINUR NEGATIVE  NITRITE NEGATIVE  LEUKOCYTESUR NEGATIVE    Lipid Panel:  No results found for: CHOL, TRIG, HDL, CHOLHDL, VLDL, LDLCALC  HgbA1C: No results found for:  HGBA1C  Urine Drug Screen:  No results found for: LABOPIA, COCAINSCRNUR, LABBENZ, AMPHETMU, THCU, LABBARB  Alcohol Level: No results for input(s): ETH in the last 168 hours.  Other results:   Imaging: Ct Head Wo Contrast  01/13/2015   CLINICAL DATA:  Larey SeatFell 1 week ago and hit head. Headache starting last night.  EXAM: CT HEAD WITHOUT CONTRAST  TECHNIQUE: Contiguous axial images were obtained from the base of the skull through the vertex without contrast.  COMPARISON:  None  FINDINGS: Normal appearance of the intracranial structures. No evidence for acute hemorrhage, mass lesion, midline shift, hydrocephalus or large infarct. No acute bony abnormality. The visualized sinuses are clear.  IMPRESSION: No acute intracranial abnormality.   Electronically Signed   By: Richarda OverlieAdam  Henn M.D.   On: 01/13/2015 11:49   Mr Brain Wo Contrast  01/14/2015   CLINICAL DATA:  Stroke. Fall 1 week ago with head injury. Left-sided weakness.  EXAM: MRI HEAD WITHOUT CONTRAST  TECHNIQUE: Multiplanar, multiecho pulse sequences of the brain and surrounding structures were obtained without intravenous contrast.  COMPARISON:  CT head 01/13/2015  FINDINGS: Ventricle size is normal.  Cerebral volume is normal  Pituitary normal in size. Cervical medullary junction normal. Paranasal sinuses are clear.  Negative for acute infarct. Scattered small hyperintensities in the frontal white matter left greater than right likely are due to chronic microvascular ischemia  Negative for hemorrhage or mass. No edema or shift of the midline structures.  IMPRESSION: Mild white matter changes most notably left frontal lobe, most likely due to chronic micro vascular ischemia  No acute abnormality.   Electronically Signed   By: Marlan Palauharles  Clark M.D.   On: 01/14/2015 14:23   [place] Carotid Bilateral  01/14/2015   CLINICAL DATA:  59 year old female  EXAM: BILATERAL CAROTID DUPLEX ULTRASOUND  TECHNIQUE: Wallace CullensGray scale imaging, color Doppler and duplex ultrasound were  performed of bilateral carotid and vertebral arteries in the neck.  COMPARISON:  MRI 01/14/2015  FINDINGS: Criteria: Quantification of carotid stenosis is based on velocity parameters that correlate the residual internal carotid diameter with NASCET-based stenosis levels, using the diameter of the distal internal carotid lumen as the denominator for stenosis measurement.  The following velocity measurements were obtained:  RIGHT  ICA:  Systolic 84 cm/sec, Diastolic 33 cm/sec  CCA:  94 cm/sec  SYSTOLIC ICA/CCA RATIO:  0.9  ECA:  104 cm/sec  LEFT  ICA:  Systolic 82 cm/sec, Diastolic 23 cm/sec  CCA:  23 cm/sec  SYSTOLIC ICA/CCA RATIO:  0.7  ECA:  107 cm/sec  Right Brachial SBP: Not acquired  Left Brachial SBP: Not acquired  RIGHT CAROTID ARTERY: Unremarkable appearance of the carotid system without significant atherosclerotic disease. Intermediate waveform of the CCA. Low resistance waveform of the ICA. No significant tortuosity of the carotid system.  RIGHT VERTEBRAL ARTERY: Antegrade flow with low resistance waveform.  LEFT CAROTID ARTERY: Unremarkable appearance of the carotid system without significant atherosclerotic disease. Intermediate waveform of the CCA. Low resistance waveform of the ICA. No significant tortuosity  of the carotid system.  LEFT VERTEBRAL ARTERY:  Antegrade flow with low resistance waveform.  IMPRESSION: Color duplex indicates no significant plaque, with no hemodynamically significant stenosis by duplex criteria in the extracranial cerebrovascular circulation.  Signed,  Yvone Neu. Loreta Ave, DO  [facility]  [facility]   Electronically Signed   By: Gilmer Mor D.O.   On: 01/14/2015 11:24   Dg Hip Unilat With Pelvis 2-3 Views Left  01/13/2015   CLINICAL DATA:  Left side weakness  EXAM: LEFT HIP (WITH PELVIS) 2-3 VIEWS  COMPARISON:  None.  FINDINGS: Three views of the left hip submitted. No acute fracture or subluxation. Bilateral hip joints are  symmetrical in appearance.  IMPRESSION: Negative.   Electronically Signed   By: Natasha Mead M.D.   On: 01/13/2015 14:09     Assessment/Plan: 59 y.o. female [place] female with left upper and lower extremity weakness for 2 months. Several days prior to this admission she developed a headache, worsening of the weakness and pain in the left shoulder. Presented with L sided weakness that has resolved.  Pt is able to ambulate. No abnormalities on MRI -d/c planning from neuro stand point - f/up out pt with neurologyo Pauletta Browns  01/15/2015, 9:34 AM

## 2015-01-15 NOTE — Progress Notes (Signed)
Physical Therapy Treatment Patient Details Name: Tammy Lam MRN: [id number] DOB: 10/23/1955 Today's Date: 01/15/2015    History of Present Illness presented to ER wtih headache x2 days status post fall/syncopal episode with L-sided weakness; admitted for acute CVA. Head CT negative for acute change, L hip/pelvis negative for acute injury; MRI negative.    PT Comments    Improving mobility; able to perform with less physical assist from therapist this date.  Continues to prefer use of RW for optimal safety/indep with all mobility.  BERG balance assessment indicative of mild higher-level balance deficits; patient with good awareness and good use of compensatory strategies. Patient scheduled for discharge home this date; patient/husband with no further questions concerning mobility or therapy needs at this time.  [place] interpreter, Tammy Lam (ID# [phone]), with Language Line utilized throughout session.  Follow Up Recommendations  Outpatient PT     Equipment Recommendations  Rolling walker with 5" wheels    Recommendations for Other Services       Precautions / Restrictions Precautions Precautions: Fall Restrictions Weight Bearing Restrictions: No    Mobility  Bed Mobility Overal bed mobility: Independent                Transfers Overall transfer level: Independent                  Ambulation/Gait Ambulation/Gait assistance: Supervision Ambulation Distance (Feet): 150 Feet Assistive device: Rolling walker (2 wheeled)     Gait velocity interpretation: <1.8 ft/sec, indicative of risk for recurrent falls General Gait Details: reciprocal stepping pattern with improved symmetry in LE stance time, step height/length.  Fair cadence/gait speed.   Stairs            Wheelchair Mobility    Modified Rankin (Stroke Patients Only)       Balance                                    Cognition                            Exercises  Other Exercises Other Exercises: Gait [phone]' without assist device, cga--maintains guarded trunk posturing with limited trunk rotation/arm swing.  Decreased stance time L LE, though improved from previous session.  Able to complete head turns, changes of direction and turns without LOB.  continues to voice optimal comfort/confidence with use of RW. Other Exercises: Verbally reviewed technique for car transfer; patient/husband voiced understanding.    General Comments        Pertinent Vitals/Pain Pain Assessment: No/denies pain    Home Living                      Prior Function            PT Goals (current goals can now be found in the care plan section) Acute Rehab PT Goals Patient Stated Goal: to get better PT Goal Formulation: With patient/family Time For Goal Achievement: 01/28/15 Potential to Achieve Goals: Good Progress towards PT goals: Progressing toward goals    Frequency  Min 2X/week    PT Plan Current plan remains appropriate    Co-evaluation             End of Session Equipment Utilized During Treatment: Gait belt Activity Tolerance: Patient tolerated treatment well Patient left: in bed;with call bell/phone within reach;with family/visitor present  Time: 1610-9604 PT Time Calculation (min) (ACUTE ONLY): 23 min  Charges:  $Gait Training: 8-22 mins $Therapeutic Activity: 8-22 mins                    G Codes:      Wandy Bossler H. Manson Passey, PT, DPT 01/15/2015, 1:31 PM [phone]

## 2015-01-16 NOTE — Discharge Summary (Signed)
[facility] [place] [facility]  DISCHARGE SUMMARY   PATIENT NAME: Tammy Lam    MR#:  [id number]  DATE OF BIRTH:  [birth year]  DATE OF ADMISSION:  01/13/2015 ADMITTING PHYSICIAN: Houston Siren, MD  DATE OF DISCHARGE: 01/15/2015  3:35 PM  PRIMARY CARE PHYSICIAN: No primary care provider on file.    ADMISSION DIAGNOSIS:  fall  DISCHARGE DIAGNOSIS:  Active Problems:   Left-sided weakness   SECONDARY DIAGNOSIS:   Past Medical History  Diagnosis Date  . Medical history non-contributory     HOSPITAL COURSE:   1) left sided weakness: Etiology is unclear. All visits were performed with the assistance of telephone translator. It seems that the left-sided weakness started several months ago but has been getting worse. On the day of admission she fell landing on the left side. She experienced increase left arm weakness with left shoulder pain radiating down the arm. CT of the head was normal. MRI showed left frontal lobe white matter change not consistent with her symptoms. She was seen by neurology and during his examination she did not exhibit any left arm weakness. Carotid Dopplers, 2-D echocardiogram were also normal. Physical therapy recommended ongoing outpatient physical therapy for left-sided weakness. She will need to follow-up with her primary care physician, a list of physicians was provided prior to discharge. It is possible that she has 2 distinct issues as she reports the left arm and left leg weakness began at different times. She reports that she does have chronic back pain and she may have sciatica contributing to the left leg weakness. She reports that she had an injury to the left shoulder many years ago which could be causing her left arm pain and weakness.  #2 fall: Left hip and pelvis x-ray negative for fracture. Due to left-sided weakness. Ongoing physical therapy recommended an outpatient setting. Referral was made prior to  discharge  DISCHARGE CONDITIONS:   Stable  CONSULTS OBTAINED:  Treatment Team:  Pauletta Browns, MD  DRUG ALLERGIES:   Allergies  Allergen Reactions  . Benadryl [Diphenhydramine Hcl] Itching  . Penicillins Itching    DISCHARGE MEDICATIONS:   Discharge Medication List as of 01/15/2015  3:07 PM    START taking these medications   Details  aspirin EC 81 MG EC tablet Take 1 tablet (81 mg total) by mouth daily., Starting 01/15/2015, Until Discontinued, Normal         DISCHARGE INSTRUCTIONS:    See AVS  If you experience worsening of your admission symptoms, develop shortness of breath, life threatening emergency, suicidal or homicidal thoughts you must seek medical attention immediately by calling 911 or calling your MD immediately  if symptoms less severe.  You Must read complete instructions/literature along with all the possible adverse reactions/side effects for all the Medicines you take and that have been prescribed to you. Take any new Medicines after you have completely understood and accept all the possible adverse reactions/side effects.   Please note  You were cared for by a hospitalist during your hospital stay. If you have any questions about your discharge medications or the care you received while you were in the hospital after you are discharged, you can call the unit and asked to speak with the hospitalist on call if the hospitalist that took care of you is not available. Once you are discharged, your primary care physician will handle any further medical issues. Please note that NO REFILLS for any discharge medications will be authorized once  you are discharged, as it is imperative that you return to your primary care physician (or establish a relationship with a primary care physician if you do not have one) for your aftercare needs so that they can reassess your need for medications and monitor your lab values.    Today   CHIEF COMPLAINT:   Symptoms  improving on day of discharge. No new complaints  HISTORY OF PRESENT ILLNESS:  Tammy Lam is a 59 y.o. [place] female with no past medical history who presents to the hospital after a fall at home. As per the husband who gives most of the history patient was in usual state of health but developed some headache over the past couple days. Patient also has been having some weakness to her left side for the past couple days. Patient this morning was ambulating and felt dizzy and then fell. As per the husband patient was not arousable for about 3-4 minutes although was breathing. She was brought to the emergency room for further evaluation patient continues to have some left-sided tingling and weakness and suspected to have a CVA and therefore is being admitted for further evaluation. Patient complains of a headache which is nonradiating in the center of her head. Patient did have some associated nausea but did not vomit.  VITAL SIGNS:  Blood pressure 110/62, pulse 65, temperature 98.2 F (36.8 C), temperature source Oral, resp. rate 16, height  (1.651 m), weight 60.873 kg (134 lb 3.2 oz), SpO2 97 %.  I/O:  No intake or output data in the 24 hours ending 01/16/15 1842  PHYSICAL EXAMINATION:  GENERAL: 59 y.o.-year-old patient lying in the bed with no acute distress.  EYES: Pupils equal, round, reactive to light and accommodation. No scleral icterus. Extraocular muscles intact.  HEENT: Head atraumatic, normocephalic. Oropharynx and nasopharynx clear.  NECK: Supple, no jugular venous distention. No thyroid enlargement, no tenderness.  LUNGS: Normal breath sounds bilaterally, no wheezing, rales,rhonchi or crepitation. No use of accessory muscles of respiration.  CARDIOVASCULAR: S1, S2 normal. No murmurs, rubs, or gallops.  ABDOMEN: Soft, nontender, nondistended. Bowel sounds present. No organomegaly or mass.  EXTREMITIES: No pedal edema, cyanosis, or clubbing.  NEUROLOGIC: Left-sided  facial droop, left arm and leg weakness, decreased sensation over the left face and left arm. PSYCHIATRIC: The patient is alert and oriented x 3.  SKIN: No obvious rash, lesion, or ulcer.   DATA REVIEW:   CBC  Recent Labs Lab 01/14/15 0454  WBC 5.2  HGB 14.6  HCT 43.7  PLT 152    Chemistries   Recent Labs Lab 01/13/15 1346 01/14/15 0454  NA 142 141  K 3.6 3.9  CL 107 106  CO2 27 27  GLUCOSE 85 93  BUN 11 16  CREATININE 0.67 0.79  CALCIUM 9.4 9.5  AST 25  --   ALT 23  --   ALKPHOS 120  --   BILITOT 0.8  --     Cardiac Enzymes  Recent Labs Lab 01/13/15 1346  TROPONINI <0.03    Microbiology Results  No results found for this or any previous visit.  RADIOLOGY:  No results found.  EKG:   Orders placed or performed during the hospital encounter of 01/13/15  . ED EKG  . ED EKG  . EKG 12-Lead  . EKG 12-Lead  . EKG      Management plans discussed with the patient, family and they are in agreement.  CODE STATUS:   TOTAL TIME TAKING CARE OF THIS  PATIENT: 40 minutes.    Elby ShowersWALSH, CATHERINE M.D on 01/16/2015 at 6:42 PM  Between 7am to 6pm - Pager - [phone]  After 6pm go to [email or web address] - password [email or web address] [facility]  [facility]  Office  [phone]  CC: Primary care physician; No primary care provider on file.

## 2015-03-14 ENCOUNTER — Ambulatory Visit
Admission: RE | Admit: 2015-03-14 | Discharge: 2015-03-14 | Disposition: A | Discharge status: Home / Self Care | Payer: Medicaid Other | Source: Ambulatory Visit | Attending: Dentistry | Admitting: Dentistry

## 2015-03-14 ENCOUNTER — Other Ambulatory Visit: Payer: Self-pay | Admitting: Dentistry

## 2015-03-14 DIAGNOSIS — M25512 Pain in left shoulder: Secondary | ICD-10-CM | POA: Insufficient documentation

## 2015-03-14 DIAGNOSIS — W19XXXA Unspecified fall, initial encounter: Secondary | ICD-10-CM | POA: Insufficient documentation

## 2015-03-14 DIAGNOSIS — H547 Unspecified visual loss: Secondary | ICD-10-CM | POA: Diagnosis present

## 2015-03-14 DIAGNOSIS — G43909 Migraine, unspecified, not intractable, without status migrainosus: Secondary | ICD-10-CM | POA: Diagnosis present

## 2015-03-26 ENCOUNTER — Ambulatory Visit: Payer: Disability Insurance | Attending: Oncology | Admitting: *Deleted

## 2015-03-26 ENCOUNTER — Encounter: Payer: Self-pay | Admitting: *Deleted

## 2015-03-26 ENCOUNTER — Other Ambulatory Visit: Payer: Self-pay | Admitting: Oncology

## 2015-03-26 ENCOUNTER — Ambulatory Visit
Admission: RE | Admit: 2015-03-26 | Discharge: 2015-03-26 | Disposition: A | Discharge status: Home / Self Care | Payer: Disability Insurance | Source: Ambulatory Visit | Attending: Oncology | Admitting: Oncology

## 2015-03-26 VITALS — BP 157/79 | HR 81 | Temp 97.9°F | Ht 63.78 in | Wt 132.2 lb

## 2015-03-26 DIAGNOSIS — Z0000 Encounter for general adult medical examination without abnormal findings: Secondary | ICD-10-CM

## 2015-03-26 NOTE — Progress Notes (Signed)
Subjective:     Patient ID: Tammy Lam, female   DOB: 26-Jan-1956, 59 y.o.   MRN: [id number]  HPI   Review of Systems     Objective:   Physical Exam  Pulmonary/Chest: Right breast exhibits no inverted nipple, no mass, no nipple discharge, no skin change and no tenderness. Left breast exhibits no mass, no nipple discharge, no skin change and no tenderness. Breasts are symmetrical.  Abdominal: There is no splenomegaly or hepatomegaly.  Genitourinary:    No labial fusion. There is no rash, tenderness, lesion or injury on the right labia. There is no rash, tenderness, lesion or injury on the left labia. Uterus is not deviated, not enlarged and not fixed. Cervix exhibits discharge. Cervix exhibits no motion tenderness and no friability. Right adnexum displays no mass, no tenderness and no fullness. Left adnexum displays no mass, no tenderness and no fullness. No erythema, tenderness or bleeding in the vagina. No foreign body around the vagina. No signs of injury around the vagina. Vaginal discharge found.         Assessment:     59 year old [place] female presents to [facility] for clinical breast exam, pap smear and mammogram.  [place] interpreter present during the interview and exam.  Clinical breast exam unremarkable.  Taught self breast awareness.  Specimen collected for pap smear.  Blood pressure elevated at 157/79.  She is to recheck her blood pressure at [facility] or [facility], and if remains higher than 140/90 she is to follow-up with her primary care provider.  Hand out on hypertention given to patient.  Patient has been screened for eligibility.  She does not have any insurance, Medicare or Medicaid.  She also meets financial eligibility.  Hand-out given on the Affordable Care Act.    Plan:     Screening mammogram ordered.  Follow-up per protocol.

## 2015-03-28 LAB — PAP LB AND HPV HIGH-RISK
HPV, HIGH-RISK: NEGATIVE
PAP Smear Comment: 0

## 2015-04-04 ENCOUNTER — Encounter: Payer: Self-pay | Admitting: *Deleted

## 2015-04-04 NOTE — Progress Notes (Signed)
Letter mailed to inform patient of her normal mammogram and pap smear.  To follow up in one year.  [facility] to [place].

## 2015-12-02 ENCOUNTER — Inpatient Hospital Stay: Payer: Medicaid Other | Attending: Internal Medicine | Admitting: Internal Medicine

## 2015-12-02 ENCOUNTER — Inpatient Hospital Stay: Payer: Medicaid Other

## 2015-12-02 ENCOUNTER — Encounter: Payer: Self-pay | Admitting: Internal Medicine

## 2015-12-02 VITALS — BP 120/74 | HR 75 | Temp 97.9°F | Resp 18 | Wt 134.7 lb

## 2015-12-02 DIAGNOSIS — Z7982 Long term (current) use of aspirin: Secondary | ICD-10-CM | POA: Diagnosis not present

## 2015-12-02 DIAGNOSIS — M25532 Pain in left wrist: Secondary | ICD-10-CM | POA: Diagnosis not present

## 2015-12-02 DIAGNOSIS — R7989 Other specified abnormal findings of blood chemistry: Secondary | ICD-10-CM | POA: Insufficient documentation

## 2015-12-02 DIAGNOSIS — M79622 Pain in left upper arm: Secondary | ICD-10-CM

## 2015-12-02 LAB — FERRITIN: FERRITIN: 332 ng/mL — AB (ref 11–307)

## 2015-12-02 NOTE — Progress Notes (Signed)
Patient here today as new evaluation regarding high ferritin levels.  Referred by Dr. Welton FlakesKhan.  Patient c/o pain in left side.  Unable to lift left arm.  Patient had a fall about 12 years ago.  Had PT in the past.

## 2015-12-02 NOTE — Progress Notes (Signed)
[facility] CONSULT NOTE  Patient Care Team: No Pcp Per Patient as PCP - General (General Practice)  CHIEF COMPLAINTS/PURPOSE OF CONSULTATION:   # MARCH 2017- ELEVATED FERRITIN [485; N-150]; sat-38%; LFTs- Unremarkable; Hb 15/platlets- 155; creat- 0.74  HISTORY OF PRESENTING ILLNESS:  Tammy Lam 60 y.o.  female very pleasant [place] patient has been referred to [place] for elevation of her ferritin.  Patient complains of chronic left shoulder left arm pain/ left wrist pain going on for the last 12 years; status post injury. Patient denies history of diabetes/any skin rash or any heart disease. She denies any liver disease. Denies drinking alcohol. Denies any family history of liver disease or cancers.   Denies any weight loss. Denies any leg swelling. No fever no chills. No nausea no vomiting. No abnormal weight gain.  ROS: A complete 10 point review of system is done which is negative except mentioned above in history of present illness  MEDICAL HISTORY:  Past Medical History  Diagnosis Date  . Medical history non-contributory   . Shoulder injury   . Elevated ferritin   . Hyponatremia   . Headache     SURGICAL HISTORY: Past Surgical History  Procedure Laterality Date  . Ankle surgery Right     SOCIAL HISTORY: originally from [place]; moved from [place] appx 1 year ago.  Social History   Social History  . Marital Status: Married    Spouse Name: N/A  . Number of Children: N/A  . Years of Education: N/A   Occupational History  . Not on file.   Social History Main Topics  . Smoking status: Never Smoker   . Smokeless tobacco: Never Used  . Alcohol Use: No  . Drug Use: No  . Sexual Activity:    Partners: Male   Other Topics Concern  . Not on file   Social History Narrative    FAMILY HISTORY: No family in no family history of liver disease or liver cancers.   ALLERGIES:  is allergic to benadryl and penicillins.  MEDICATIONS:  Current  Outpatient Prescriptions  Medication Sig Dispense Refill  . aspirin EC 81 MG EC tablet Take 1 tablet (81 mg total) by mouth daily. 60 tablet 0  . ibuprofen (ADVIL,MOTRIN) 600 MG tablet Take 600 mg by mouth every 6 (six) hours as needed.     No current facility-administered medications for this visit.      Marland Kitchen  PHYSICAL EXAMINATION: ECOG PERFORMANCE STATUS: 0 - Asymptomatic  Filed Vitals:   12/02/15 1342  BP: 120/74  Pulse: 75  Temp: 97.9 F (36.6 C)  Resp: 18   Filed Weights   12/02/15 1342  Weight: 134 lb 11.2 oz (61.1 kg)    GENERAL: Well-nourished well-developed; Alert, no distress and comfortable. With interpreter.  EYES: no pallor or icterus OROPHARYNX: no thrush or ulceration; good dentition  NECK: supple, no masses felt LYMPH:  no palpable lymphadenopathy in the cervical, axillary or inguinal regions LUNGS: clear to auscultation and  No wheeze or crackles HEART/CVS: regular rate & rhythm and no murmurs; No lower extremity edema ABDOMEN: abdomen soft, non-tender and normal bowel sounds Musculoskeletal:no cyanosis of digits and no clubbing  PSYCH: alert & oriented x 3 with fluent speech NEURO: no focal motor/sensory deficits SKIN:  no rashes or significant lesions  LABORATORY DATA:  I have reviewed the data as listed Lab Results  Component Value Date   WBC 5.2 01/14/2015   HGB 14.6 01/14/2015   HCT 43.7 01/14/2015  MCV 92.3 01/14/2015   PLT 152 01/14/2015    Recent Labs  01/13/15 1346 01/14/15 0454  NA 142 141  K 3.6 3.9  CL 107 106  CO2 27 27  GLUCOSE 85 93  BUN 11 16  CREATININE 0.67 0.79  CALCIUM 9.4 9.5  GFRNONAA >60 >60  GFRAA >60 >60  PROT 7.3  --   ALBUMIN 4.2  --   AST 25  --   ALT 23  --   ALKPHOS 120  --   BILITOT 0.8  --      ASSESSMENT & PLAN:   # Elevated ferritin 485/ unclear etiology question secondary versus primary- hereditary hemochromatosis. Clinically I do not think patient symptoms of left shoulder pain and left arm  pain and this pain is not related to elevated ferritin.  Repeat CRP/ferritin levels today. Also check genetic testing for hemochromatosis.  # The above plan of care was discussed with the patient in detail. All questions were answered/through the interpreter.  Patient follow-up in approximately 3-4 weeks to review the results of the blood work/next plan of care.  Thank you Dr. Welton FlakesKhan for allowing me to participate in the care of your pleasant patient. Please do not hesitate to contact me with questions or concerns in the interim.     Earna CoderGovinda R Brahmanday, MD 12/02/2015 2:14 PM

## 2015-12-03 ENCOUNTER — Other Ambulatory Visit: Payer: Self-pay | Admitting: Nurse Practitioner

## 2015-12-03 ENCOUNTER — Ambulatory Visit
Admission: RE | Admit: 2015-12-03 | Discharge: 2015-12-03 | Disposition: A | Discharge status: Home / Self Care | Payer: Medicaid Other | Source: Ambulatory Visit | Attending: Nurse Practitioner | Admitting: Nurse Practitioner

## 2015-12-03 DIAGNOSIS — M25512 Pain in left shoulder: Secondary | ICD-10-CM | POA: Diagnosis present

## 2015-12-03 LAB — C-REACTIVE PROTEIN: CRP: 3 mg/dL — ABNORMAL HIGH (ref <1.0)

## 2015-12-04 ENCOUNTER — Other Ambulatory Visit: Payer: Self-pay | Admitting: Nurse Practitioner

## 2015-12-04 DIAGNOSIS — R51 Headache: Secondary | ICD-10-CM

## 2015-12-04 DIAGNOSIS — R519 Headache, unspecified: Secondary | ICD-10-CM

## 2015-12-04 DIAGNOSIS — R531 Weakness: Secondary | ICD-10-CM

## 2015-12-08 LAB — HEMOCHROMATOSIS DNA-PCR(C282Y,H63D)

## 2015-12-09 ENCOUNTER — Ambulatory Visit
Admission: RE | Admit: 2015-12-09 | Discharge: 2015-12-09 | Disposition: A | Discharge status: Home / Self Care | Payer: Medicaid Other | Source: Ambulatory Visit | Attending: Nurse Practitioner | Admitting: Nurse Practitioner

## 2015-12-09 DIAGNOSIS — G939 Disorder of brain, unspecified: Secondary | ICD-10-CM | POA: Diagnosis not present

## 2015-12-09 DIAGNOSIS — R51 Headache: Secondary | ICD-10-CM | POA: Insufficient documentation

## 2015-12-09 DIAGNOSIS — H5315 Visual distortions of shape and size: Secondary | ICD-10-CM | POA: Insufficient documentation

## 2015-12-09 DIAGNOSIS — R531 Weakness: Secondary | ICD-10-CM | POA: Insufficient documentation

## 2015-12-09 DIAGNOSIS — R519 Headache, unspecified: Secondary | ICD-10-CM

## 2015-12-23 ENCOUNTER — Inpatient Hospital Stay: Payer: Medicaid Other | Attending: Internal Medicine | Admitting: Internal Medicine

## 2015-12-23 VITALS — BP 123/81 | HR 68 | Temp 98.5°F | Resp 18 | Wt 133.4 lb

## 2015-12-23 DIAGNOSIS — R7989 Other specified abnormal findings of blood chemistry: Secondary | ICD-10-CM | POA: Diagnosis not present

## 2015-12-23 NOTE — Progress Notes (Signed)
Used lang. Line interpreter [place]. Interpreter # [phone]

## 2015-12-23 NOTE — Progress Notes (Signed)
[facility] CONSULT NOTE  Patient Care Team: Carlean Jews, NP as PCP - General (Family Medicine)  CHIEF COMPLAINTS/PURPOSE OF CONSULTATION:   # APRIL 2017-HETEROZYGOUS H63D HEMOCHROMATOSIS MUTATION; ELEVATED FERRITIN Tammy Lam [id number]; N-150]; sat-38%; LFTs- Unremarkable; Hb 15/platlets- 155; creat- 0.74; April 2017- Ferritin- 332- No Phlebotomy.   HISTORY OF PRESENTING ILLNESS: Interview was done through telephone interpreter.  Lyndel Salonga 60 y.o.  female very pleasant [place] patient is here today with the results of the labs that were ordered at last visit.  Patient denies any new symptoms. Denies any weight loss. Denies any leg swelling. No fever no chills. No nausea no vomiting. No abnormal weight gain.  ROS: A complete 10 point review of system is done which is negative except mentioned above in history of present illness  MEDICAL HISTORY:  Past Medical History  Diagnosis Date  . Medical history non-contributory   . Shoulder injury   . Elevated ferritin   . Hyponatremia   . Headache     SURGICAL HISTORY: Past Surgical History  Procedure Laterality Date  . Ankle surgery Right     SOCIAL HISTORY: originally from [place]; moved from [place] appx 1 year ago.  Social History   Social History  . Marital Status: Married    Spouse Name: N/A  . Number of Children: N/A  . Years of Education: N/A   Occupational History  . Not on file.   Social History Main Topics  . Smoking status: Never Smoker   . Smokeless tobacco: Never Used  . Alcohol Use: No  . Drug Use: No  . Sexual Activity:    Partners: Male   Other Topics Concern  . Not on file   Social History Narrative    FAMILY HISTORY: No family in no family history of liver disease or liver cancers.   ALLERGIES:  is allergic to benadryl and penicillins.  MEDICATIONS:  No current outpatient prescriptions on file.   No current facility-administered medications for this visit.       Marland Kitchen  PHYSICAL EXAMINATION: ECOG PERFORMANCE STATUS: 0 - Asymptomatic  Filed Vitals:   12/23/15 1003  BP: 123/81  Pulse: 68  Temp: 98.5 F (36.9 C)  Resp: 18   Filed Weights   12/23/15 1003  Weight: 133 lb 6.1 oz (60.5 kg)    GENERAL: Well-nourished well-developed; Alert, no distress and comfortable; with family.    LABORATORY DATA:  I have reviewed the data as listed Lab Results  Component Value Date   WBC 5.2 01/14/2015   HGB 14.6 01/14/2015   HCT 43.7 01/14/2015   MCV 92.3 01/14/2015   PLT 152 01/14/2015    Recent Labs  01/13/15 1346 01/14/15 0454  NA 142 141  K 3.6 3.9  CL 107 106  CO2 27 27  GLUCOSE 85 93  BUN 11 16  CREATININE 0.67 0.79  CALCIUM 9.4 9.5  GFRNONAA >60 >60  GFRAA >60 >60  PROT 7.3  --   ALBUMIN 4.2  --   AST 25  --   ALT 23  --   ALKPHOS 120  --   BILITOT 0.8  --      ASSESSMENT & PLAN:   # Primary- hereditary hemochromatosis. H63D Heterozygous [Elevated ferritin  322 ]. Asymptomatic/ no concerns of iron overload. Recommend phlebotomy if ferritin greater than 1000/saturation greater than 60%.   # Recommend no dietary restrictions.   # follow up in 1 year- with labs/ 1 week prior; if ferritin still elevated then  recommend Phlebotomy.   # 15 minutes face-to-face with the patient discussing the above plan of care; more than 50% of time spent on counseling and coordination through interpreter.     Earna CoderGovinda R Jeffry Vogelsang, MD 12/23/2015 10:23 AM

## 2016-01-22 ENCOUNTER — Other Ambulatory Visit: Payer: Self-pay | Admitting: Nurse Practitioner

## 2016-01-22 DIAGNOSIS — M25512 Pain in left shoulder: Secondary | ICD-10-CM

## 2016-02-12 ENCOUNTER — Ambulatory Visit
Admission: RE | Admit: 2016-02-12 | Discharge: 2016-02-12 | Disposition: A | Discharge status: Home / Self Care | Payer: Medicaid Other | Source: Ambulatory Visit | Attending: Nurse Practitioner | Admitting: Nurse Practitioner

## 2016-02-12 DIAGNOSIS — M25512 Pain in left shoulder: Secondary | ICD-10-CM | POA: Insufficient documentation

## 2016-02-12 DIAGNOSIS — M7552 Bursitis of left shoulder: Secondary | ICD-10-CM | POA: Diagnosis not present

## 2016-03-08 DIAGNOSIS — M5412 Radiculopathy, cervical region: Secondary | ICD-10-CM | POA: Insufficient documentation

## 2016-03-08 DIAGNOSIS — M75100 Unspecified rotator cuff tear or rupture of unspecified shoulder, not specified as traumatic: Secondary | ICD-10-CM | POA: Insufficient documentation

## 2016-03-08 DIAGNOSIS — M75110 Incomplete rotator cuff tear or rupture of unspecified shoulder, not specified as traumatic: Secondary | ICD-10-CM | POA: Insufficient documentation

## 2016-03-08 DIAGNOSIS — G5600 Carpal tunnel syndrome, unspecified upper limb: Secondary | ICD-10-CM | POA: Insufficient documentation

## 2016-04-19 DIAGNOSIS — R519 Headache, unspecified: Secondary | ICD-10-CM | POA: Insufficient documentation

## 2016-04-19 DIAGNOSIS — R51 Headache: Secondary | ICD-10-CM

## 2016-04-19 DIAGNOSIS — M79602 Pain in left arm: Secondary | ICD-10-CM | POA: Insufficient documentation

## 2016-05-24 ENCOUNTER — Other Ambulatory Visit: Payer: Self-pay | Admitting: Neurology

## 2016-05-24 DIAGNOSIS — M25512 Pain in left shoulder: Principal | ICD-10-CM

## 2016-05-24 DIAGNOSIS — G44229 Chronic tension-type headache, not intractable: Secondary | ICD-10-CM | POA: Insufficient documentation

## 2016-05-24 DIAGNOSIS — G8929 Other chronic pain: Secondary | ICD-10-CM

## 2016-05-24 DIAGNOSIS — H538 Other visual disturbances: Secondary | ICD-10-CM | POA: Insufficient documentation

## 2016-05-24 DIAGNOSIS — M79602 Pain in left arm: Secondary | ICD-10-CM

## 2016-06-11 ENCOUNTER — Ambulatory Visit
Admission: RE | Admit: 2016-06-11 | Discharge: 2016-06-11 | Disposition: A | Discharge status: Home / Self Care | Payer: Medicaid Other | Source: Ambulatory Visit | Attending: Neurology | Admitting: Neurology

## 2016-06-11 DIAGNOSIS — M4802 Spinal stenosis, cervical region: Secondary | ICD-10-CM | POA: Insufficient documentation

## 2016-06-11 DIAGNOSIS — M25512 Pain in left shoulder: Secondary | ICD-10-CM | POA: Insufficient documentation

## 2016-06-11 DIAGNOSIS — M79602 Pain in left arm: Secondary | ICD-10-CM | POA: Diagnosis not present

## 2016-06-11 DIAGNOSIS — G8929 Other chronic pain: Secondary | ICD-10-CM | POA: Diagnosis not present

## 2016-06-11 DIAGNOSIS — M47892 Other spondylosis, cervical region: Secondary | ICD-10-CM | POA: Insufficient documentation

## 2016-11-13 DIAGNOSIS — E781 Pure hyperglyceridemia: Secondary | ICD-10-CM | POA: Insufficient documentation

## 2016-12-09 ENCOUNTER — Other Ambulatory Visit: Payer: Self-pay | Admitting: Orthopedic Surgery

## 2016-12-09 DIAGNOSIS — M5030 Other cervical disc degeneration, unspecified cervical region: Secondary | ICD-10-CM

## 2016-12-14 ENCOUNTER — Other Ambulatory Visit: Payer: Medicaid Other

## 2016-12-20 ENCOUNTER — Ambulatory Visit: Payer: Medicaid Other

## 2016-12-21 ENCOUNTER — Ambulatory Visit: Payer: Medicaid Other | Admitting: Internal Medicine

## 2016-12-21 ENCOUNTER — Ambulatory Visit: Payer: Medicaid Other

## 2017-01-14 ENCOUNTER — Inpatient Hospital Stay: Payer: Medicaid Other | Attending: Internal Medicine

## 2017-01-14 DIAGNOSIS — R7989 Other specified abnormal findings of blood chemistry: Secondary | ICD-10-CM

## 2017-01-14 LAB — CBC WITH DIFFERENTIAL/PLATELET
Basophils Absolute: 0 10*3/uL (ref 0–0.1)
Basophils Relative: 1 %
EOS ABS: 0.1 10*3/uL (ref 0–0.7)
EOS PCT: 3 %
HCT: 45.3 % (ref 35.0–47.0)
Hemoglobin: 15.4 g/dL (ref 12.0–16.0)
LYMPHS ABS: 2.1 10*3/uL (ref 1.0–3.6)
Lymphocytes Relative: 40 %
MCH: 31 pg (ref 26.0–34.0)
MCHC: 34 g/dL (ref 32.0–36.0)
MCV: 91.2 fL (ref 80.0–100.0)
MONO ABS: 0.4 10*3/uL (ref 0.2–0.9)
Monocytes Relative: 7 %
Neutro Abs: 2.6 10*3/uL (ref 1.4–6.5)
Neutrophils Relative %: 49 %
PLATELETS: 180 10*3/uL (ref 150–440)
RBC: 4.97 MIL/uL (ref 3.80–5.20)
RDW: 12.6 % (ref 11.5–14.5)
WBC: 5.3 10*3/uL (ref 3.6–11.0)

## 2017-01-14 LAB — FERRITIN: FERRITIN: 387 ng/mL — AB (ref 11–307)

## 2017-01-14 LAB — IRON AND TIBC
IRON: 109 ug/dL (ref 28–170)
SATURATION RATIOS: 29 % (ref 10.4–31.8)
TIBC: 377 ug/dL (ref 250–450)
UIBC: 268 ug/dL

## 2017-01-14 LAB — HEPATIC FUNCTION PANEL
ALT: 31 U/L (ref 14–54)
AST: 31 U/L (ref 15–41)
Albumin: 4.2 g/dL (ref 3.5–5.0)
Alkaline Phosphatase: 105 U/L (ref 38–126)
BILIRUBIN TOTAL: 1 mg/dL (ref 0.3–1.2)
Total Protein: 7.4 g/dL (ref 6.5–8.1)

## 2017-01-18 ENCOUNTER — Inpatient Hospital Stay: Payer: Medicaid Other

## 2017-01-18 ENCOUNTER — Inpatient Hospital Stay (HOSPITAL_BASED_OUTPATIENT_CLINIC_OR_DEPARTMENT_OTHER): Payer: Medicaid Other | Admitting: Internal Medicine

## 2017-01-18 NOTE — Progress Notes (Signed)
Pt presents to clinic today in follow-up for hemochromatosis. [name] H [facility] interpreter. Present for RN patient assessment/medicaiton reconciliation and clinic visit. Husband is historian for medications.

## 2017-01-18 NOTE — Progress Notes (Signed)
Tammy Lam CONSULT NOTE  Patient Care Team: Carlean Jews, NP as PCP - General (Family Medicine)  CHIEF COMPLAINTS/PURPOSE OF CONSULTATION:   # APRIL 2017-HETEROZYGOUS H63D HEMOCHROMATOSIS MUTATION; ELEVATED FERRITIN [name] [id number]; N-150]; sat-38%; LFTs- Unremarkable; Hb 15/platlets- 155; creat- 0.74; April 2017- Ferritin- 332- No Phlebotomy.   HISTORY OF PRESENTING ILLNESS: Interview was done through telephone interpreter.  Tammy Lam 61 y.o.  female very pleasant [place] patient is here today for follow-up for her history of hemochromatosis.  Patient denies any new symptoms. Denies any weight loss. Denies any leg swelling. No fever no chills. No nausea no vomiting. No abnormal weight gain. Denies any joint pains. She continues to chronic pain of the left side of the body- for the last 14 years.   ROS: A complete 10 point review of system is done which is negative except mentioned above in history of present illness  MEDICAL HISTORY:  Past Medical History:  Diagnosis Date  . Elevated ferritin   . Headache   . Hyponatremia   . Medical history non-contributory   . Shoulder injury     SURGICAL HISTORY: Past Surgical History:  Procedure Laterality Date  . ANKLE SURGERY Right     SOCIAL HISTORY: originally from [place]; moved from [place] appx 1 year ago.  Social History   Social History  . Marital status: Married    Spouse name: N/A  . Number of children: N/A  . Years of education: N/A   Occupational History  . Not on file.   Social History Main Topics  . Smoking status: Never Smoker  . Smokeless tobacco: Never Used  . Alcohol use No  . Drug use: No  . Sexual activity: Yes    Partners: Male   Other Topics Concern  . Not on file   Social History Narrative  . No narrative on file    FAMILY HISTORY: No family in no family history of liver disease or liver cancers.   ALLERGIES:  is allergic to benadryl [diphenhydramine hcl] and  penicillins.  MEDICATIONS:  Current Outpatient Prescriptions  Medication Sig Dispense Refill  . fenofibrate (TRICOR) 145 MG tablet Take 1 tablet by mouth daily.    . meloxicam (MOBIC) 15 MG tablet Take 1 tablet by mouth daily.    . traMADol (ULTRAM) 50 MG tablet Take 1 tablet by mouth every 6 (six) hours as needed for pain.     No current facility-administered medications for this visit.       Marland Kitchen  PHYSICAL EXAMINATION: ECOG PERFORMANCE STATUS: 0 - Asymptomatic  Vitals:   01/18/17 0926  BP: (!) 142/74  Pulse: 65  Temp: 98.8 F (37.1 C)   Filed Weights   01/18/17 0925  Weight: 137 lb 2 oz (62.2 kg)    GENERAL: Well-nourished well-developed; Alert, no distress and comfortable; with family.  HEENT- atraumatic normocephalic pupils equal and reactive to light. CVS- heart is regular rate and rhythm no murmurs; lower extremities no edema bilateral Lungs- clear to auscultation bilaterally. No wheezing or crackles. Abdomen soft nontender nondistended. No hepatomegaly. Bowel sounds present. Neurologically- alert and oriented 3 no focal deficits noted  Skin no rash.    LABORATORY DATA:  I have reviewed the data as listed Lab Results  Component Value Date   WBC 5.3 01/14/2017   HGB 15.4 01/14/2017   HCT 45.3 01/14/2017   MCV 91.2 01/14/2017   PLT 180 01/14/2017    Recent Labs  01/14/17 0926  PROT 7.4  ALBUMIN 4.2  AST 31  ALT 31  ALKPHOS 105  BILITOT 1.0  BILIDIR <0.1*  IBILI NOT CALCULATED   Results for Oppedisano, Madisson (MRN [id number]) as of 01/18/2017 09:33  Ref. Range 12/02/2015 14:28 01/14/2017 09:26  Iron Latest Ref Range: 28 - 170 ug/dL  034109  UIBC Latest Units: ug/dL  742268  TIBC Latest Ref Range: 250 - 450 ug/dL  595377  Saturation Ratios Latest Ref Range: 10.4 - 31.8 %  29  Ferritin Latest Ref Range: 11 - 307 ng/mL 332 (H) 387 (H)    ASSESSMENT & PLAN:  Hereditary hemochromatosis (HCC) # Primary- hereditary hemochromatosis. H63D Heterozygous [Elevated ferritin   387; saturation 28%].  Asymptomatic/ no concerns of iron overload. Recommend phlebotomy if ferritin greater than 1000/saturation greater than 60%.   # Long discussion the patient and family regarding the fact that there should not cause any decreased life expectancy or any major medical problems/organ dysfunction- as long this is monitored. Discussed with the patient and family in detail. They agree. I do not recommend no dietary restrictions.   # follow up in 1 year- with labs/ 1 week prior; if ferritin still elevated then recommend Phlebotomy.   # 15 minutes face-to-face with the patient discussing the above plan of care; more than 50% of time spent on counseling and coordination through interpreter.   Cc: Ms. Tyler PitaBoscia      Govinda R Brahmanday, MD 01/18/2017 9:56 AM

## 2017-01-18 NOTE — Assessment & Plan Note (Addendum)
#   Primary- hereditary hemochromatosis. H63D Heterozygous [Elevated ferritin  387; saturation 28%].  Asymptomatic/ no concerns of iron overload. Recommend phlebotomy if ferritin greater than 1000/saturation greater than 60%.   # Long discussion the patient and family regarding the fact that there should not cause any decreased life expectancy or any major medical problems/organ dysfunction- as long this is monitored. Discussed with the patient and family in detail. They agree. I do not recommend no dietary restrictions.   # follow up in 1 year- with labs/ 1 week prior; if ferritin still elevated then recommend Phlebotomy.   # 15 minutes face-to-face with the patient discussing the above plan of care; more than 50% of time spent on counseling and coordination through interpreter.   Cc: [name]

## 2017-01-19 ENCOUNTER — Ambulatory Visit
Admission: RE | Admit: 2017-01-19 | Discharge: 2017-01-19 | Disposition: A | Discharge status: Home / Self Care | Payer: Medicaid Other | Source: Ambulatory Visit | Attending: Orthopedic Surgery | Admitting: Orthopedic Surgery

## 2017-01-19 DIAGNOSIS — M40292 Other kyphosis, cervical region: Secondary | ICD-10-CM | POA: Insufficient documentation

## 2017-01-19 DIAGNOSIS — M47892 Other spondylosis, cervical region: Secondary | ICD-10-CM | POA: Insufficient documentation

## 2017-01-19 DIAGNOSIS — M5030 Other cervical disc degeneration, unspecified cervical region: Secondary | ICD-10-CM

## 2017-01-19 DIAGNOSIS — M4802 Spinal stenosis, cervical region: Secondary | ICD-10-CM | POA: Diagnosis not present

## 2017-02-15 DIAGNOSIS — M19019 Primary osteoarthritis, unspecified shoulder: Secondary | ICD-10-CM | POA: Insufficient documentation

## 2017-02-15 DIAGNOSIS — R9402 Abnormal brain scan: Secondary | ICD-10-CM | POA: Insufficient documentation

## 2017-02-15 DIAGNOSIS — M542 Cervicalgia: Secondary | ICD-10-CM | POA: Insufficient documentation

## 2017-08-30 ENCOUNTER — Encounter: Payer: Self-pay | Admitting: Internal Medicine

## 2017-08-30 ENCOUNTER — Ambulatory Visit: Payer: Medicaid Other | Admitting: Internal Medicine

## 2017-08-30 VITALS — BP 119/72 | HR 72 | Resp 16 | Ht 65.0 in | Wt 143.0 lb

## 2017-08-30 DIAGNOSIS — R531 Weakness: Secondary | ICD-10-CM | POA: Diagnosis not present

## 2017-08-30 DIAGNOSIS — M25519 Pain in unspecified shoulder: Secondary | ICD-10-CM

## 2017-08-30 DIAGNOSIS — E782 Mixed hyperlipidemia: Secondary | ICD-10-CM | POA: Diagnosis not present

## 2017-08-30 DIAGNOSIS — M25419 Effusion, unspecified shoulder: Secondary | ICD-10-CM | POA: Diagnosis not present

## 2017-08-30 MED ORDER — MELOXICAM 15 MG PO TABS: ORAL_TABLET | ORAL | 3 refills | Status: DC

## 2017-08-30 MED ORDER — GABAPENTIN 100 MG PO CAPS: 100.0000 mg | ORAL_CAPSULE | Freq: Four times a day (QID) | ORAL | 3 refills | Status: DC

## 2017-08-30 NOTE — Progress Notes (Signed)
[facility] [address] [place], [place] [address]  Internal MEDICINE  Office Visit Note  Patient Name: Tammy Lam  [id number]  [id number]  Date of Service: 08/30/2017     Complaints/HPI Pt is here for routine follow up.  Pt is having left shoulder pain. Pain radiates to her neck and gets worse with movment   Other  This is a new problem. The current episode started in the past 7 days. The problem occurs constantly. The problem has been unchanged. Associated symptoms include a visual change. Pertinent negatives include no arthralgias, chest pain, chills, congestion, coughing, fatigue, headaches, joint swelling, myalgias, nausea, neck pain, sore throat, vomiting or weakness. Nothing aggravates the symptoms. She has tried NSAIDs for the symptoms. The treatment provided no relief.    Current Medication: Outpatient Encounter Medications as of 08/30/2017  Medication Sig  . diclofenac sodium (VOLTAREN) 1 % GEL Apply 4 g topically 4 (four) times daily as needed.  . fenofibrate (TRICOR) 145 MG tablet Take 1 tablet by mouth daily.  Marland Kitchen gabapentin (NEURONTIN) 100 MG capsule Take 100 mg by mouth 4 (four) times daily.  . meloxicam (MOBIC) 15 MG tablet Take 1 tablet by mouth daily.  . methocarbamol (ROBAXIN) 500 MG tablet Take 500 mg by mouth 3 (three) times daily as needed for muscle spasms.  . rosuvastatin (CRESTOR) 10 MG tablet Take 10 mg by mouth daily. Take with Tricor  . traMADol (ULTRAM) 50 MG tablet Take 1 tablet by mouth every 6 (six) hours as needed for pain.   No facility-administered encounter medications on file as of 08/30/2017.     Surgical History: Past Surgical History:  Procedure Laterality Date  . ANKLE SURGERY Right     Medical History: Past Medical History:  Diagnosis Date  . Elevated ferritin   . Headache   . Hyponatremia   . Medical history non-contributory   . Shoulder injury     Family History: Family History  Problem Relation Age of Onset  .  Liver disease Unknown     Social History   Socioeconomic History  . Marital status: Married    Spouse name: Not on file  . Number of children: Not on file  . Years of education: Not on file  . Highest education level: Not on file  Social Needs  . Financial resource strain: Not on file  . Food insecurity - worry: Not on file  . Food insecurity - inability: Not on file  . Transportation needs - medical: Not on file  . Transportation needs - non-medical: Not on file  Occupational History  . Not on file  Tobacco Use  . Smoking status: Never Smoker  . Smokeless tobacco: Never Used  Substance and Sexual Activity  . Alcohol use: No    Alcohol/week: 0.0 oz  . Drug use: No  . Sexual activity: Yes    Partners: Male  Other Topics Concern  . Not on file  Social History Narrative  . Not on file      Review of Systems  Constitutional: Negative for appetite change, chills, fatigue and unexpected weight change.  HENT: Negative for congestion, ear pain, postnasal drip, rhinorrhea, sinus pressure, sinus pain, sore throat, tinnitus and trouble swallowing.   Eyes: Negative for photophobia, discharge, redness, itching and visual disturbance.  Respiratory: Negative for apnea, cough, choking, chest tightness, shortness of breath and wheezing.   Cardiovascular: Negative for chest pain, palpitations and leg swelling.  Gastrointestinal: Negative for abdominal distention, blood in stool, constipation,  diarrhea, nausea and vomiting.  Endocrine: Negative for polydipsia, polyphagia and polyuria.  Genitourinary: Negative for difficulty urinating, dyspareunia, dysuria, flank pain, frequency, menstrual problem, pelvic pain and vaginal bleeding.  Musculoskeletal: Negative for arthralgias, back pain, gait problem, joint swelling, myalgias and neck pain.  Allergic/Immunologic: Negative for environmental allergies and food allergies.  Neurological: Negative for dizziness, tremors, syncope, weakness and  headaches.  Hematological: Negative for adenopathy. Does not bruise/bleed easily.  Psychiatric/Behavioral: Negative for agitation, dysphoric mood, hallucinations, self-injury and suicidal ideas. The patient is not nervous/anxious.     Vital Signs: BP 119/72 (BP Location: Right Arm, Patient Position: Sitting)   Pulse 72   Resp 16   Ht 5\' 5"  (1.651 m)   Wt 143 lb (64.9 kg)   LMP  (LMP Unknown)   SpO2 97%   BMI 23.80 kg/m    Physical Exam  Constitutional: She is oriented to person, place, and time. She appears well-developed and well-nourished. No distress.  HENT:  Head: Normocephalic and atraumatic.  Mouth/Throat: Oropharynx is clear and moist. No oropharyngeal exudate.  Eyes: EOM are normal. Pupils are equal, round, and reactive to light.  Neck: Normal range of motion. Neck supple. No JVD present. No tracheal deviation present. No thyromegaly present.  Cardiovascular: Normal rate, regular rhythm and normal heart sounds. Exam reveals no gallop and no friction rub.  No murmur heard. Pulmonary/Chest: Effort normal. No respiratory distress. She has no wheezes. She has no rales. She exhibits no tenderness.  Abdominal: Soft. Bowel sounds are normal.  Musculoskeletal: She exhibits tenderness.       Left shoulder: She exhibits pain.       Arms: Lymphadenopathy:    She has no cervical adenopathy.  Neurological: She is alert and oriented to person, place, and time. No cranial nerve deficit.  Skin: Skin is warm and dry. She is not diaphoretic.  Psychiatric: She has a normal mood and affect. Her behavior is normal. Judgment and thought content normal.      Assessment/Plan: 1. Left-sided weakness Pt will need PT  2. Pain and swelling of shoulder, unspecified laterality  - Ambulatory referral to Orthopedic Surgery - Start Mobic 15 mg po qd, she declines Intra-articular Injection  3. Hereditary hemochromatosis (HCC) -Per Hematology   4. Mixed hyperlipidemia - Continue Crestor and  Tricor    General Counseling: I have discussed the findings of the evaluation and examination with Bennye.  I have also discussed any further diagnostic evaluation that may be needed or ordered today. Rainna verbalizes understanding of the findings of todays visit. We also reviewed her medications today. she has been encouraged to call the office with any questions or concerns that should arise related to todays visit.  Counseling: .Take all medications as prescribed inclusing calcium and vit D     Time spent:30    Dr Lyndon CodeFozia M Marquesa Rath Internal medicine

## 2017-09-30 ENCOUNTER — Encounter: Payer: Self-pay | Admitting: Internal Medicine

## 2017-10-04 ENCOUNTER — Encounter: Payer: Self-pay | Admitting: Internal Medicine

## 2017-10-04 ENCOUNTER — Ambulatory Visit: Payer: Medicaid Other | Admitting: Internal Medicine

## 2017-10-04 VITALS — BP 129/80 | HR 78 | Resp 16 | Ht 65.0 in | Wt 141.6 lb

## 2017-10-04 DIAGNOSIS — R531 Weakness: Secondary | ICD-10-CM | POA: Diagnosis not present

## 2017-10-04 DIAGNOSIS — M5412 Radiculopathy, cervical region: Secondary | ICD-10-CM | POA: Diagnosis not present

## 2017-10-04 DIAGNOSIS — M25519 Pain in unspecified shoulder: Secondary | ICD-10-CM | POA: Diagnosis not present

## 2017-10-04 DIAGNOSIS — G44039 Episodic paroxysmal hemicrania, not intractable: Secondary | ICD-10-CM

## 2017-10-04 DIAGNOSIS — M47812 Spondylosis without myelopathy or radiculopathy, cervical region: Secondary | ICD-10-CM | POA: Insufficient documentation

## 2017-10-04 DIAGNOSIS — M25419 Effusion, unspecified shoulder: Secondary | ICD-10-CM | POA: Diagnosis not present

## 2017-10-04 MED ORDER — GABAPENTIN 300 MG PO CAPS: ORAL_CAPSULE | ORAL | 12 refills | Status: DC

## 2017-10-04 NOTE — Progress Notes (Signed)
[facility] [address] [place], [place] [address]  Internal MEDICINE  Office Visit Note  Patient Name: Tammy Lam  [id number]  [id number]  Date of Service: 10/04/2017  Chief Complaint  Patient presents with  . Follow-up    1 month    HPI  Pt is here for routine follow up.  She continues to have left shoulder pain, neck pain, worsening weakness of left arm, Husband is in the room, there is language barrier, can speak little english, does speak [place] H/O abnormal MRI of Cspine in 2018. Has been seen by ortho and and did receive some IA injections.now c/o headaches   Current Medication: Outpatient Encounter Medications as of 10/04/2017  Medication Sig  . diclofenac sodium (VOLTAREN) 1 % GEL Apply 4 g topically 4 (four) times daily as needed.  . gabapentin (NEURONTIN) 100 MG capsule Take 1 capsule (100 mg total) by mouth 4 (four) times daily.  . meloxicam (MOBIC) 15 MG tablet TAKE ONE TAB IN AM AFTER BREAKFAST  . fenofibrate (TRICOR) 145 MG tablet Take 1 tablet by mouth daily.  Marland Kitchen ibuprofen (ADVIL,MOTRIN) 600 MG tablet Take by mouth.  . [DISCONTINUED] methocarbamol (ROBAXIN) 500 MG tablet Take 500 mg by mouth 3 (three) times daily as needed for muscle spasms.  . [DISCONTINUED] rosuvastatin (CRESTOR) 10 MG tablet Take 10 mg by mouth daily. Take with Tricor  . [DISCONTINUED] traMADol (ULTRAM) 50 MG tablet Take 1 tablet by mouth every 6 (six) hours as needed for pain.   No facility-administered encounter medications on file as of 10/04/2017.     Surgical History: Past Surgical History:  Procedure Laterality Date  . ANKLE SURGERY Right     Medical History: Past Medical History:  Diagnosis Date  . Elevated ferritin   . Headache   . Hemochromatosis   . Hyperlipidemia   . Hyponatremia   . Medical history non-contributory   . Shoulder injury     Family History: Family History  Problem Relation Age of Onset  . Liver disease Unknown     Social History    Socioeconomic History  . Marital status: Married    Spouse name: Not on file  . Number of children: Not on file  . Years of education: Not on file  . Highest education level: Not on file  Social Needs  . Financial resource strain: Not on file  . Food insecurity - worry: Not on file  . Food insecurity - inability: Not on file  . Transportation needs - medical: Not on file  . Transportation needs - non-medical: Not on file  Occupational History  . Not on file  Tobacco Use  . Smoking status: Never Smoker  . Smokeless tobacco: Never Used  Substance and Sexual Activity  . Alcohol use: No    Alcohol/week: 0.0 oz  . Drug use: No  . Sexual activity: Yes    Partners: Male  Other Topics Concern  . Not on file  Social History Narrative  . Not on file      Review of Systems  Constitutional: Positive for fatigue. Negative for chills and diaphoresis.  HENT: Negative for ear pain, postnasal drip and sinus pressure.   Respiratory: Negative for cough, shortness of breath and wheezing.   Cardiovascular: Negative for chest pain, palpitations and leg swelling.  Gastrointestinal: Positive for nausea. Negative for abdominal pain, constipation, diarrhea and vomiting.  Genitourinary: Negative for dysuria and flank pain.  Musculoskeletal: Negative for arthralgias, back pain, gait problem and neck pain.  Skin: Negative for color change.  Allergic/Immunologic: Negative for environmental allergies and food allergies.  Neurological: Positive for weakness and headaches. Negative for dizziness.  Hematological: Does not bruise/bleed easily.  Psychiatric/Behavioral: Negative for agitation, behavioral problems (depression) and hallucinations.  left arm weakness.  Vital Signs: BP 129/80 (BP Location: Right Arm, Patient Position: Sitting)   Pulse 78   Resp 16   Ht $RemoveBefo [id number]$5\' 5"P Unknown)   SpO2 98%   BMI 23.56 kg/m    Physical Exam  Constitutional:  She is oriented to person, place, and time. She appears well-developed and well-nourished.  HENT:  Head: Normocephalic and atraumatic.  Eyes: EOM are normal. Pupils are equal, round, and reactive to light.  Neck:    Cardiovascular: Normal rate and regular rhythm.  Pulmonary/Chest: Effort normal and breath sounds normal.  Abdominal: Soft.  Musculoskeletal: She exhibits tenderness.  Decreased rom .. c spine   Neurological: She is alert and oriented to person, place, and time. No cranial nerve deficit. She exhibits abnormal muscle tone.  Psychiatric: She has a normal mood and affect.   Assessment/Plan: 1. Left-sided weakness - Ambulatory referral to Neurosurgery to review MRI and offer other options   2. Pain and swelling of shoulder, unspecified laterality - Radiating to shoulder, ? Neuarlgia. Increase Neurontin 300 mg tid   3. Episodic paroxysmal hemicrania, not intractable - Will need further work up with neurology  General Counseling: Yarianna verbalizes understanding of the findings of todays visit and agrees with plan of treatment. I have discussed any further diagnostic evaluation that may be needed or ordered today. We also reviewed her medications today. she has been encouraged to call the office with any questions or concerns that should arise related to todays visit.  Time spent:25 Minutes  Dr Lyndon CodeFozia M Khan Internal medicine

## 2017-10-12 ENCOUNTER — Other Ambulatory Visit: Payer: Self-pay | Admitting: Neurology

## 2017-10-12 DIAGNOSIS — I639 Cerebral infarction, unspecified: Secondary | ICD-10-CM

## 2017-10-18 ENCOUNTER — Telehealth: Payer: Self-pay | Admitting: Internal Medicine

## 2017-10-20 ENCOUNTER — Ambulatory Visit
Admission: RE | Admit: 2017-10-20 | Discharge: 2017-10-20 | Disposition: A | Discharge status: Home / Self Care | Payer: Medicaid Other | Source: Ambulatory Visit | Attending: Neurology | Admitting: Neurology

## 2017-10-20 DIAGNOSIS — I639 Cerebral infarction, unspecified: Secondary | ICD-10-CM | POA: Insufficient documentation

## 2017-10-25 ENCOUNTER — Ambulatory Visit: Payer: Medicaid Other | Attending: Neurology

## 2017-10-25 DIAGNOSIS — M79602 Pain in left arm: Secondary | ICD-10-CM | POA: Diagnosis present

## 2017-10-25 DIAGNOSIS — M79632 Pain in left forearm: Secondary | ICD-10-CM | POA: Diagnosis present

## 2017-10-25 DIAGNOSIS — M542 Cervicalgia: Secondary | ICD-10-CM | POA: Insufficient documentation

## 2017-10-25 DIAGNOSIS — G8929 Other chronic pain: Secondary | ICD-10-CM | POA: Diagnosis present

## 2017-10-25 DIAGNOSIS — M5412 Radiculopathy, cervical region: Secondary | ICD-10-CM

## 2017-10-25 DIAGNOSIS — M25512 Pain in left shoulder: Secondary | ICD-10-CM | POA: Insufficient documentation

## 2017-10-25 DIAGNOSIS — M6281 Muscle weakness (generalized): Secondary | ICD-10-CM | POA: Diagnosis present

## 2017-10-25 DIAGNOSIS — M79642 Pain in left hand: Secondary | ICD-10-CM | POA: Insufficient documentation

## 2017-10-25 DIAGNOSIS — M25522 Pain in left elbow: Secondary | ICD-10-CM | POA: Insufficient documentation

## 2017-10-25 NOTE — Therapy (Signed)
[facility] PHYSICAL AND SPORTS MEDICINE [address], [place], [address] Phone: [phone]   Fax:  [phone]  Physical Therapy Evaluation  Patient Details  Name: Tammy Lam MRN: [id number] Date of Birth: 1956/02/28 Referring Provider: Theora Master, MD   Encounter Date: 10/25/2017  PT End of Session - 10/25/17 0809    Visit Number  1    Date for PT Re-Evaluation  12/15/17    Authorization Type  1 of 4    Authorization Time Period  Medicaid    PT Start Time  0810    PT Stop Time  0913    PT Time Calculation (min)  63 min    Activity Tolerance  Patient limited by pain    Behavior During Therapy  [facility] for tasks assessed/performed       Past Medical History:  Diagnosis Date  . Elevated ferritin   . Headache   . Hemochromatosis   . Hyperlipidemia   . Hyponatremia   . Medical history non-contributory   . Shoulder injury     Past Surgical History:  Procedure Laterality Date  . ANKLE SURGERY Right     There were no vitals filed for this visit.   Subjective Assessment - 10/25/17 0816    Subjective  L side of head, shoulder, arm, hand, and L back: 4-5/10 currently. 7-8/10 at worst.     Pertinent History  L arm and shoulder pain: 16 years ago when pt was at work, pt had to lift heavy items. Pt had electric pad treatment. Also went to PT and for some reason, she could not do exercises.   Pt could not move her arm.  2 years ago pt started taking medication.  1 month ago pt started taking injections.  Medication does help however the injection helps with the pain but also has pain in her elbow to her hand and the back of her neck.  Feels pain from elbow down her whole arm and hand. Feels a lot of pain on the palm of her L hand (pt observed to rub her hand a lot along the L C7/C8 dermatome on her hand).  Denies loss of bowel or bladder control. When she moves her L hand, her pain also affects her head down to her feet on the L side.  Doctor  just gave her injections, no mention of surgery.  Also has pain in the L side of her tongue, eye, face.  Pt states that the neurologist knows about it.  Does not think she can exercise her L UE due to pain.  Last PT was 15-16 years ago.   Pt is L hand dominant.     Patient Stated Goals  Pt expresses desire to get better.     Currently in Pain?  Yes    Pain Score  5     Pain Location  -- L lateral neck and L UE predominantly    Pain Orientation  Left    Pain Type  Chronic pain    Pain Onset  More than a month ago    Pain Frequency  Constant    Aggravating Factors   moving her L arm increases L sided pain (head, to L LE); moving her head (turning her head to the L , R, looking up)    Pain Relieving Factors  keeping her L arm straight.          [facility] PT Assessment - 10/25/17 1324  Assessment   Medical Diagnosis  L shoulder and arm pain    Referring Provider  Theora Master, MD    Onset Date/Surgical Date  10/05/17 Date therapy referral signed. Chronic condition    Prior Therapy  Pt participated in PT abou 16 years ago but could not move her L arm.       Precautions   Precaution Comments  L UE and LE pain      Restrictions   Other Position/Activity Restrictions  No known weight bearing restrictions      Balance Screen   Has the patient fallen in the past 6 months  -- unknown      Home Environment   Additional Comments  Pt lives with her husband.       Observation/Other Assessments   Observations  Difficulty donnin and doffing coat. L biceps spasms.     Focus on Therapeutic Outcomes (FOTO)   4    Quick DASH   88.6%      Posture/Postural Control   Posture Comments  movement crease around C3/C4 area. R shoulder higher, protracted neck, R scapular protraction, decreased lordosis       AROM   Cervical Flexion  limited with L neck pain    Cervical Extension  limited wiht L neck pain    Cervical - Right Side Bend  limited with L sided neck pain     Cervical - Left Side Bend   limited with L sided neck pain, worse than R side bend    Cervical - Right Rotation  limited with L neck pain    Cervical - Left Rotation  limited with L neck pain (worse than R rotation)       Strength   Right Shoulder Flexion  4-/5    Left Shoulder Flexion  2+/5 62 degrees with pain    Right Elbow Flexion  3+/5    Left Elbow Flexion  2+/5 65 degrees    Right Wrist Extension  3+/5    Left Wrist Extension  2+/5             Objective measurements completed on examination: See above findings.   Pt states her blood pressure is high.  Blood pressure R arm sitting, mechanically taken, normal size cuff: 149/81, HR 70  Language line translator:[place] [id number]  Part of evaluation time spent for interpretation  Difficulty donning and doffing coat. L biceps spasm observed Pt gave verbal permission to talke about PT with husband.  FOTO filled out with husband assistance Pt husband states that they are busy and willl be back by November 28, 2017.    Unable to provide date of next Visit with Dr Malvin Johns.    Called Dr. Malvin Johns 10/26/2017 and informed him about pt reports of L face, eye, tongue, UE, and L LE pain with L UE movement. Per Dr. Malvin Johns, pt is clear to participate in physical therapy.        PT Education - 10/25/17 1958    Education provided  Yes    Education Details  plan of care    Person(s) Educated  Patient;Spouse    Methods  Explanation    Comprehension  Verbalized understanding          PT Long Term Goals - 10/25/17 2010      PT LONG TERM GOAL #1   Title  Pt will have a decrease in L cervical and L UE pain to 5/10 or less at worst to promote ability to use L  UE to perform functional tasks.     Baseline  8/10 at worst. (10/25/2017)    Time  7    Period  Weeks    Status  New    Target Date  12/15/17      PT LONG TERM GOAL #2   Title  Pt will improve her L UE strength by at least 1/2 MMT grade to promote ability to perform functional tasks.     Baseline  Left:  shoulder flexion 2+/5, elbow flexion 2+/5, wrist extension 2+/5 (10/25/2017)    Time  7    Period  Weeks    Status  New    Target Date  12/15/17      PT LONG TERM GOAL #3   Title  Patient will improve her FOTO score by at least 10 points as a demonstration of improved function.     Baseline  4 (filled out with husband's help 10/25/2017)    Time  7    Period  Weeks    Status  New    Target Date  12/15/17      PT LONG TERM GOAL #4   Title  Patient will improve her Quick Dash score by at least 10% as a demonnstration of improved function.     Baseline  88.6% (filled out with husband's help 10/25/2017)    Time  7    Period  Weeks    Status  New    Target Date  12/15/17             Plan - 10/25/17 1958    Clinical Impression Statement  Patient is a 62 year old female who came to physical therapy secondary to L UE pain. She also presents with reproduction of symptoms with cervical movements all planes with the most provocative movements being L cervical rotation as well as L cervical side bending as well as with L UE movements; L UE weakness, altered posture and difficulty performing functional tasks secondary to pain and weakness. Patient will benefit from skilled physical therapy services to address the aforementioned deficits.  Per phone conversation with Dr. Malvin JohnsPotter, patient is clear to participate in physical therapy.     History and Personal Factors relevant to plan of care:  Chronicity of condition, multiple areas involved, language barrier, pain, weakness, difficulty using her L UE for tasks    Clinical Presentation  Evolving    Clinical Presentation due to:  Pain seems to be worse based on pt subjective reports.     Clinical Decision Making  Moderate    Rehab Potential  Fair    Clinical Impairments Affecting Rehab Potential  Chronicity of condition, multiple areas involved, language barrier, pain, weakness, difficulty using her L UE for tasks    PT Frequency  Other (comment) 3  visits in 7 weeks. Unable to start until 11/28/17 due to their busy schedule    PT Duration  Other (comment) 3 visits in 7 weeks. Unable to start until 11/28/17 due to their busy schedule    PT Treatment/Interventions  Manual techniques;Patient/family education;Therapeutic activities;Therapeutic exercise;Passive range of motion;Dry needling;Neuromuscular re-education;Ultrasound;Electrical Stimulation;Iontophoresis 4mg /ml Dexamethasone;Aquatic Therapy traction if appropriate    PT Next Visit Plan  manual techniques, ROM, gentle strengthening, modalities as needed    Consulted and Agree with Plan of Care  Patient;Family member/caregiver    Family Member Consulted  husband       Patient will benefit from skilled therapeutic intervention in order to improve the following deficits and impairments:  Pain, Postural dysfunction, Improper body mechanics, Impaired UE functional use, Decreased strength, Decreased range of motion  Visit Diagnosis: Radiculopathy, cervical region - Plan: PT plan of care cert/re-cert  Cervicalgia - Plan: PT plan of care cert/re-cert  Chronic left shoulder pain - Plan: PT plan of care cert/re-cert  Pain in left arm - Plan: PT plan of care cert/re-cert  Pain in left elbow - Plan: PT plan of care cert/re-cert  Pain in left forearm - Plan: PT plan of care cert/re-cert  Pain in left hand - Plan: PT plan of care cert/re-cert  Muscle weakness (generalized) - Plan: PT plan of care cert/re-cert     Problem List Patient Active Problem List   Diagnosis Date Noted  . Abnormal brain scan 02/15/2017  . Inflammation of shoulder joint 02/15/2017  . Neck pain 02/15/2017  . Hypertriglyceridemia 11/13/2016  . Blurry vision 05/24/2016  . Chronic tension-type headache, not intractable 05/24/2016  . Acute headache 04/19/2016  . Left arm pain 04/19/2016  . Rotator cuff tear 03/08/2016  . Carpal tunnel syndrome 03/08/2016  . Cervical radiculitis 03/08/2016   Loralyn Freshwater PT, DPT    10/26/2017, 3:02 PM  [facility] PHYSICAL AND SPORTS MEDICINE [address] [place], [address] Phone: [phone]   Fax:  [phone]  Name: Estefani Bateson MRN: [id number] Date of Birth: Jan 05, 1956

## 2017-10-31 ENCOUNTER — Ambulatory Visit: Payer: Medicaid Other

## 2017-11-02 ENCOUNTER — Ambulatory Visit: Payer: Medicaid Other

## 2017-11-07 ENCOUNTER — Ambulatory Visit: Payer: Medicaid Other

## 2017-11-28 ENCOUNTER — Ambulatory Visit: Payer: Medicaid Other | Attending: Neurology

## 2017-11-28 DIAGNOSIS — G8929 Other chronic pain: Secondary | ICD-10-CM | POA: Diagnosis present

## 2017-11-28 DIAGNOSIS — M6281 Muscle weakness (generalized): Secondary | ICD-10-CM | POA: Insufficient documentation

## 2017-11-28 DIAGNOSIS — M5412 Radiculopathy, cervical region: Secondary | ICD-10-CM | POA: Insufficient documentation

## 2017-11-28 DIAGNOSIS — M79642 Pain in left hand: Secondary | ICD-10-CM | POA: Diagnosis present

## 2017-11-28 DIAGNOSIS — M542 Cervicalgia: Secondary | ICD-10-CM | POA: Diagnosis present

## 2017-11-28 DIAGNOSIS — M25512 Pain in left shoulder: Secondary | ICD-10-CM | POA: Diagnosis present

## 2017-11-28 DIAGNOSIS — M79602 Pain in left arm: Secondary | ICD-10-CM | POA: Diagnosis present

## 2017-11-28 DIAGNOSIS — M25522 Pain in left elbow: Secondary | ICD-10-CM | POA: Diagnosis present

## 2017-11-28 DIAGNOSIS — M79632 Pain in left forearm: Secondary | ICD-10-CM | POA: Diagnosis present

## 2017-11-28 NOTE — Patient Instructions (Addendum)
Use a heating pad for your shoulder area (right then left or both at the same time)  15 minutes at a time for 4 times a day. Use a cloth barrier in between so that it is not too hot.         Scapular Retraction    Perform gently   Squeeze right shoulder blade back   Keep head straight   Repeat __5__ times per set. Do __3__ sets per session.   Perform 2 times a day     Copyright  VHI. All rights reserved.

## 2017-11-28 NOTE — Therapy (Signed)
[place] [facility] [address] [place], [address] Phone: [phone]   Fax:  [phone]  Physical Therapy Treatment  Patient Details  Name: Tammy Lam MRN: [id number] Date of Birth: [birth year] Referring Provider: Theora Master, MD   Encounter Date: 11/28/2017  PT End of Session - 11/28/17 0902    Visit Number  2    Date for PT Re-Evaluation  12/15/17    Authorization Type  1 of 4    Authorization Time Period  Medicaid    PT Start Time  0903    PT Stop Time  0958    PT Time Calculation (min)  55 min    Activity Tolerance  Patient limited by pain    Behavior During Therapy  [facility] for tasks assessed/performed       Past Medical History:  Diagnosis Date  . Elevated ferritin   . Headache   . Hemochromatosis   . Hyperlipidemia   . Hyponatremia   . Medical history non-contributory   . Shoulder injury     Past Surgical History:  Procedure Laterality Date  . ANKLE SURGERY Right     There were no vitals filed for this visit.  Subjective Assessment - 11/28/17 0904    Subjective  L neck, UE and L face feels better. Felt better overall after 2 days from last session. 4/10 L face and neck currently.     Pertinent History  L arm and shoulder pain: 16 years ago when pt was at work, pt had to lift heavy items. Pt had electric pad treatment. Also went to PT and for some reason, she could not do exercises.   Pt could not move her arm.  2 years ago pt started taking medication.  1 month ago pt started taking injections.  Medication does help however the injection helps with the pain but also has pain in her elbow to her hand and the back of her neck.  Feels pain from elbow down her whole arm and hand. Feels a lot of pain on the palm of her L hand (pt observed to rub her hand a lot along the L C7/C8 dermatome on her hand).  Denies loss of bowel or bladder control. When she moves her L hand, her pain also affects her head down to  her feet on the L side.  Doctor just gave her injections, no mention of surgery.  Also has pain in the L side of her tongue, eye, face.  Pt states that the neurologist knows about it.  Does not think she can exercise her L UE due to pain.  Last PT was 15-16 years ago.   Pt is L hand dominant.     Patient Stated Goals  Pt expresses desire to get better.     Currently in Pain?  Yes    Pain Score  4     Pain Onset  More than a month ago                               PT Education - 11/28/17 1010    Education provided  Yes    Education Details  ther-ex, HEP    Person(s) Educated  Patient    Methods  Explanation;Demonstration;Tactile cues;Verbal cues;Handout    Comprehension  Returned demonstration;Verbalized understanding         Objectives Interpreter present: Selena Batten who also hand wrote translation for HEP.  Blood pressure R arm sitting, mechanically taken, normal cuff: 127/67, HR 63  Manual therapy  Seated STM to R rhomboid/upper trap muscle.   Decreased neck pain  Supine gentle STM to cervical paraspinal muscles L > R  Supine gentle suboccipital release  Pt states feeling a little bit better afterwards  Increased time secondary to interpretation     Therapeutic exercise   Gentle bilateral scapular retraction 5x  Then R side 5x4   Seated gentle manually resisted L scapular retraction targeting lower trap muscle 5x5 seconds for 3 sets  Pt states exercise helps her neck feel better   Increased time secondary to interpretation.    Improved exercise technique, movement at target joints, use of target muscles after mod verbal, visual, tactile cues.     Decreased neck pain with activation of L lower trap muscle and manual therapy to decrease R upper trap and rhomboid tension.  Recommended for pt to use a heating pad for her upper trap muscles to help decrease tension and hopefully decrease pain.     PT Long Term Goals - 10/25/17 2010      PT  LONG TERM GOAL #1   Title  Pt will have a decrease in L cervical and L UE pain to 5/10 or less at worst to promote ability to use L UE to perform functional tasks.     Baseline  8/10 at worst. (10/25/2017)    Time  7    Period  Weeks    Status  New    Target Date  12/15/17      PT LONG TERM GOAL #2   Title  Pt will improve her L UE strength by at least 1/2 MMT grade to promote ability to perform functional tasks.     Baseline  Left: shoulder flexion 2+/5, elbow flexion 2+/5, wrist extension 2+/5 (10/25/2017)    Time  7    Period  Weeks    Status  New    Target Date  12/15/17      PT LONG TERM GOAL #3   Title  Patient will improve her FOTO score by at least 10 points as a demonstration of improved function.     Baseline  4 (filled out with husband's help 10/25/2017)    Time  7    Period  Weeks    Status  New    Target Date  12/15/17      PT LONG TERM GOAL #4   Title  Patient will improve her Quick Dash score by at least 10% as a demonnstration of improved function.     Baseline  88.6% (filled out with husband's help 10/25/2017)    Time  7    Period  Weeks    Status  New    Target Date  12/15/17            Plan - 11/28/17 1008    Clinical Impression Statement  Decreased neck pain with activation of L lower trap muscle and manual therapy to decrease R upper trap and rhomboid tension.  Recommended for pt to use a heating pad for her upper trap muscles to help decrease tension and hopefully decrease pain.     Rehab Potential  Fair    Clinical Impairments Affecting Rehab Potential  Chronicity of condition, multiple areas involved, language barrier, pain, weakness, difficulty using her L UE for tasks    PT Frequency  Other (comment) 3 visits in 7 weeks. Unable to start until 11/28/17 due  to their busy schedule    PT Duration  Other (comment) 3 visits in 7 weeks. Unable to start until 11/28/17 due to their busy schedule    PT Treatment/Interventions  Manual techniques;Patient/family  education;Therapeutic activities;Therapeutic exercise;Passive range of motion;Dry needling;Neuromuscular re-education;Ultrasound;Electrical Stimulation;Iontophoresis 4mg /ml Dexamethasone;Aquatic Therapy traction if appropriate    PT Next Visit Plan  manual techniques, ROM, gentle strengthening, modalities as needed    Consulted and Agree with Plan of Care  Patient;Family member/caregiver    Family Member Consulted  husband       Patient will benefit from skilled therapeutic intervention in order to improve the following deficits and impairments:  Pain, Postural dysfunction, Improper body mechanics, Impaired UE functional use, Decreased strength, Decreased range of motion  Visit Diagnosis: Cervicalgia  Radiculopathy, cervical region  Chronic left shoulder pain  Pain in left arm  Muscle weakness (generalized)     Problem List Patient Active Problem List   Diagnosis Date Noted  . Abnormal brain scan 02/15/2017  . Inflammation of shoulder joint 02/15/2017  . Neck pain 02/15/2017  . Hypertriglyceridemia 11/13/2016  . Blurry vision 05/24/2016  . Chronic tension-type headache, not intractable 05/24/2016  . Acute headache 04/19/2016  . Left arm pain 04/19/2016  . Rotator cuff tear 03/08/2016  . Carpal tunnel syndrome 03/08/2016  . Cervical radiculitis 03/08/2016    Loralyn FreshwaterMiguel Jeriyah Granlund PT, DPT   11/28/2017, 10:14 AM  [place] [facility] [address] [place], [place], [address] Phone: [phone]   Fax:  [phone]  Name: Harless LittenDiep Mak MRN: [id number] Date of Birth: 02/15/1956

## 2017-12-05 ENCOUNTER — Ambulatory Visit: Payer: Medicaid Other

## 2017-12-05 DIAGNOSIS — G8929 Other chronic pain: Secondary | ICD-10-CM

## 2017-12-05 DIAGNOSIS — M542 Cervicalgia: Secondary | ICD-10-CM | POA: Diagnosis not present

## 2017-12-05 DIAGNOSIS — M6281 Muscle weakness (generalized): Secondary | ICD-10-CM

## 2017-12-05 DIAGNOSIS — M25512 Pain in left shoulder: Secondary | ICD-10-CM

## 2017-12-05 DIAGNOSIS — M79642 Pain in left hand: Secondary | ICD-10-CM

## 2017-12-05 DIAGNOSIS — M25522 Pain in left elbow: Secondary | ICD-10-CM

## 2017-12-05 DIAGNOSIS — M79632 Pain in left forearm: Secondary | ICD-10-CM

## 2017-12-05 DIAGNOSIS — M79602 Pain in left arm: Secondary | ICD-10-CM

## 2017-12-05 DIAGNOSIS — M5412 Radiculopathy, cervical region: Secondary | ICD-10-CM

## 2017-12-05 NOTE — Therapy (Signed)
[place] [facility] [address], [place], [address] Phone: [phone]   Fax:  [phone]  Physical Therapy Treatment  Patient Details  Name: Tammy Lam MRN: [id number] Date of Birth: 1956/02/06 Referring Provider: Theora Master, MD   Encounter Date: 12/05/2017  PT End of Session - 12/05/17 0918    Visit Number  3    Date for PT Re-Evaluation  12/15/17    Authorization Type  3 of 4    Authorization Time Period  Medicaid    PT Start Time  [id number]    PT Stop Time  1022    PT Time Calculation (min)  58 min    Activity Tolerance  Patient limited by pain    Behavior During Therapy  [facility] for tasks assessed/performed       Past Medical History:  Diagnosis Date  . Elevated ferritin   . Headache   . Hemochromatosis   . Hyperlipidemia   . Hyponatremia   . Medical history non-contributory   . Shoulder injury     Past Surgical History:  Procedure Laterality Date  . ANKLE SURGERY Right     There were no vitals filed for this visit.  Subjective Assessment - 12/05/17 0926    Subjective  L neck and L UE is better. 4/10 neck and L UE currently. Pt states she cannot do the HEP because it causes her to have more pain. Felt better after last session.  Pain in L head is also better.     Pertinent History  L arm and shoulder pain: 16 years ago when pt was at work, pt had to lift heavy items. Pt had electric pad treatment. Also went to PT and for some reason, she could not do exercises.   Pt could not move her arm.  2 years ago pt started taking medication.  1 month ago pt started taking injections.  Medication does help however the injection helps with the pain but also has pain in her elbow to her hand and the back of her neck.  Feels pain from elbow down her whole arm and hand. Feels a lot of pain on the palm of her L hand (pt observed to rub her hand a lot along the L C7/C8 dermatome on her hand).  Denies loss of bowel or bladder  control. When she moves her L hand, her pain also affects her head down to her feet on the L side.  Doctor just gave her injections, no mention of surgery.  Also has pain in the L side of her tongue, eye, face.  Pt states that the neurologist knows about it.  Does not think she can exercise her L UE due to pain.  Last PT was 15-16 years ago.   Pt is L hand dominant.     Patient Stated Goals  Pt expresses desire to get better.     Currently in Pain?  Yes    Pain Score  4     Pain Onset  More than a month ago                               PT Education - 12/05/17 1250    Education provided  Yes    Education Details  ther-ex    [employer]) Educated  Patient    Methods  Explanation;Demonstration;Tactile cues;Verbal cues    Comprehension  Returned demonstration;Verbalized understanding  Objectives Language Line interpreter used: [phone]   Blood pressure R arm sitting, mechanically taken, normal cuff: 116/74, HR 66  Manual therapy  Seated STM to R and L rhomboid/upper trap muscle.             Decreased neck pain per pt  Supine gentle STM to cervical paraspinal muscles L and R  Supine gentle suboccipital release   Pt states the STM to the neck was uncomfortable at first but feels better afterwards    Increased time secondary to interpretation     Therapeutic exercise    Seated gentle manually resisted L scapular retraction targeting lower trap muscle 5x5 seconds for 3 sets             Pt states exercise helps her neck feel better   Pt husband education on lower trap muscle strengthening to help decrease UT tension as well as education on how to perform aforementioned exercise at home. Increased time spent for education. Pt husband demonstrated and verbalized udnerstanding.   Supine L shoulder flexion assisted, to about 20 degrees, starting with elbow bent 1x. Increased discomfort, eases with rest  Seated and supine L hand open and  close 3x. Increased fatigue and discomfort L forearm.    Increased time secondary to interpretation.    Improved exercise technique, movement at target joints, use of target muscles after mod verbal, visual, tactile cues.   Decreased neck pain with treatment to decrease tension to upper trap and posterior cervical paraspinal muscles as well as improving L lower trap muscle use. Difficulty with exercises which involves moving L UE in which neural tension may be playing a factor.      PT Long Term Goals - 10/25/17 2010      PT LONG TERM GOAL #1   Title  Pt will have a decrease in L cervical and L UE pain to 5/10 or less at worst to promote ability to use L UE to perform functional tasks.     Baseline  8/10 at worst. (10/25/2017)    Time  7    Period  Weeks    Status  New    Target Date  12/15/17      PT LONG TERM GOAL #2   Title  Pt will improve her L UE strength by at least 1/2 MMT grade to promote ability to perform functional tasks.     Baseline  Left: shoulder flexion 2+/5, elbow flexion 2+/5, wrist extension 2+/5 (10/25/2017)    Time  7    Period  Weeks    Status  New    Target Date  12/15/17      PT LONG TERM GOAL #3   Title  Patient will improve her FOTO score by at least 10 points as a demonstration of improved function.     Baseline  4 (filled out with husband's help 10/25/2017)    Time  7    Period  Weeks    Status  New    Target Date  12/15/17      PT LONG TERM GOAL #4   Title  Patient will improve her Quick Dash score by at least 10% as a demonnstration of improved function.     Baseline  88.6% (filled out with husband's help 10/25/2017)    Time  7    Period  Weeks    Status  New    Target Date  12/15/17            Plan -  12/05/17 0917    Clinical Impression Statement  Decreased neck pain with treatment to decrease tension to upper trap and posterior cervical paraspinal muscles as well as improving L lower trap muscle use. Difficulty with exercises which  involves moving L UE in which neural tension may be playing a factor.     Rehab Potential  Fair    Clinical Impairments Affecting Rehab Potential  Chronicity of condition, multiple areas involved, language barrier, pain, weakness, difficulty using her L UE for tasks    PT Frequency  Other (comment) 3 visits in 7 weeks. Unable to start until 11/28/17 due to their busy schedule    PT Duration  Other (comment) 3 visits in 7 weeks. Unable to start until 11/28/17 due to their busy schedule    PT Treatment/Interventions  Manual techniques;Patient/family education;Therapeutic activities;Therapeutic exercise;Passive range of motion;Dry needling;Neuromuscular re-education;Ultrasound;Electrical Stimulation;Iontophoresis 4mg /ml Dexamethasone;Aquatic Therapy traction if appropriate    PT Next Visit Plan  manual techniques, ROM, gentle strengthening, modalities as needed    Consulted and Agree with Plan of Care  Patient;Family member/caregiver    Family Member Consulted  husband       Patient will benefit from skilled therapeutic intervention in order to improve the following deficits and impairments:  Pain, Postural dysfunction, Improper body mechanics, Impaired UE functional use, Decreased strength, Decreased range of motion  Visit Diagnosis: Cervicalgia  Radiculopathy, cervical region  Chronic left shoulder pain  Pain in left arm  Muscle weakness (generalized)  Pain in left elbow  Pain in left forearm  Pain in left hand     Problem List Patient Active Problem List   Diagnosis Date Noted  . Abnormal brain scan 02/15/2017  . Inflammation of shoulder joint 02/15/2017  . Neck pain 02/15/2017  . Hypertriglyceridemia 11/13/2016  . Blurry vision 05/24/2016  . Chronic tension-type headache, not intractable 05/24/2016  . Acute headache 04/19/2016  . Left arm pain 04/19/2016  . Rotator cuff tear 03/08/2016  . Carpal tunnel syndrome 03/08/2016  . Cervical radiculitis 03/08/2016    Loralyn FreshwaterMiguel  Eilis Chestnutt PT, DPT   12/05/2017, 12:58 PM  [place] [facility] [address] [place], [place], [address] Phone: [phone]   Fax:  [phone]  Name: Tammy Lam MRN: [id number] Date of Birth: 08/25/1955

## 2017-12-05 NOTE — Patient Instructions (Signed)
  Seated manually resisted scapular retraction   Have your wife gently squeeze her left shoulder blade back and down.    GENTLY push her shoulder blade diagonally forward for 5 seconds    Repeat 5 times comfortably.    Perform 3 sets (at least) daily.

## 2017-12-12 ENCOUNTER — Ambulatory Visit: Payer: Medicaid Other

## 2017-12-12 DIAGNOSIS — M25512 Pain in left shoulder: Secondary | ICD-10-CM

## 2017-12-12 DIAGNOSIS — M25522 Pain in left elbow: Secondary | ICD-10-CM

## 2017-12-12 DIAGNOSIS — M5412 Radiculopathy, cervical region: Secondary | ICD-10-CM

## 2017-12-12 DIAGNOSIS — M79642 Pain in left hand: Secondary | ICD-10-CM

## 2017-12-12 DIAGNOSIS — M542 Cervicalgia: Secondary | ICD-10-CM | POA: Diagnosis not present

## 2017-12-12 DIAGNOSIS — M6281 Muscle weakness (generalized): Secondary | ICD-10-CM

## 2017-12-12 DIAGNOSIS — M79632 Pain in left forearm: Secondary | ICD-10-CM

## 2017-12-12 DIAGNOSIS — M79602 Pain in left arm: Secondary | ICD-10-CM

## 2017-12-12 DIAGNOSIS — G8929 Other chronic pain: Secondary | ICD-10-CM

## 2017-12-12 NOTE — Addendum Note (Signed)
Addended by: Charlene BrookeLAYGO, Dollie Bressi R on: 12/12/2017 06:21 PM   Modules accepted: Orders

## 2017-12-12 NOTE — Therapy (Addendum)
[place] [facility] [address] [place], [address] Phone: [phone]   Fax:  [phone]  Physical Therapy Treatment  Patient Details  Name: Tammy Lam MRN: [id number] Date of Birth: [birth year] Referring Provider: Theora Master, MD   Encounter Date: 12/12/2017  PT End of Session - 12/12/17 0859    Visit Number  4    Number of Visits  7    Date for PT Re-Evaluation  01/19/18    Authorization Type  4 of 4    Authorization Time Period  Medicaid    PT Start Time  [phone]    PT Stop Time  0954    PT Time Calculation (min)  55 min    Activity Tolerance  Patient limited by pain    Behavior During Therapy  [facility] for tasks assessed/performed       Past Medical History:  Diagnosis Date  . Elevated ferritin   . Headache   . Hemochromatosis   . Hyperlipidemia   . Hyponatremia   . Medical history non-contributory   . Shoulder injury     Past Surgical History:  Procedure Laterality Date  . ANKLE SURGERY Right     There were no vitals filed for this visit.  Subjective Assessment - 12/12/17 0900    Subjective  L neck, shoulder and arm felt better after last PT session. 4/10 currently. 8/10 L neck, and L UE at most for the past week. The HEP helps.  Feels like the PT is helping.  Better able to move her L fingers since starting PT.  I can smile now. Pt also states that when she did something in the past, she felt a lot of pain but not as much now since starting PT. Every time PT performs the manual therapy, she feels as if the right areas are being worked on. The PT also helps with the headache      Pertinent History  L arm and shoulder pain: 16 years ago when pt was at work, pt had to lift heavy items. Pt had electric pad treatment. Also went to PT and for some reason, she could not do exercises.   Pt could not move her arm.  2 years ago pt started taking medication.  1 month ago pt started taking injections.  Medication does  help however the injection helps with the pain but also has pain in her elbow to her hand and the back of her neck.  Feels pain from elbow down her whole arm and hand. Feels a lot of pain on the palm of her L hand (pt observed to rub her hand a lot along the L C7/C8 dermatome on her hand).  Denies loss of bowel or bladder control. When she moves her L hand, her pain also affects her head down to her feet on the L side.  Doctor just gave her injections, no mention of surgery.  Also has pain in the L side of her tongue, eye, face.  Pt states that the neurologist knows about it.  Does not think she can exercise her L UE due to pain.  Last PT was 15-16 years ago.   Pt is L hand dominant.     Patient Stated Goals  Pt expresses desire to get better.     Currently in Pain?  Yes    Pain Score  4     Pain Onset  More than a month ago  [facility] PT Assessment - 12/12/17 1235      Observation/Other Assessments   Focus on Therapeutic Outcomes (FOTO)   32    Quick DASH   63.6%      Strength   Overall Strength Comments  L UE muscle testing not performed today secondary to pt stating pain every time she lifts her L arm.                            PT Education - 12/12/17 1017    Education provided  Yes    Education Details  ther-ex    [employer]) Educated  Patient    Methods  Explanation;Demonstration;Tactile cues;Verbal cues    Comprehension  Returned demonstration;Verbalized understanding         Objectives  Interpreter present    Therapeutic exercise    Seated gentle manually resisted L scapular retraction targeting lower trap muscle 10x5 seconds for 3 sets  Seated L scapular depression isometrics 10x5 seconds for 3 sets. Pt states the exercise helps with neck pain after exercise. Feels pain initially but after pain subsides, neck feels better.   Seated thoracic extension over chair 5x. Discomfort  Sitting up with thoracic extension back unsupported  5x, then  10x3  Increased time secondary to interpretation.  L UE muscle testing not performed today secondary to pt stating pain every time she lifts her L arm.     Improved exercise technique, movement at target joints, use of target muscles after min to mod verbal, visual, tactile cues.     Manual therapy   Seated STM to R rhomboid/upper trap muscle.  Supine gentle STM to cervical paraspinal muscles L and R  Supine gentle suboccipital release   Pt states the STM to the neck was uncomfortable at first but feels better afterwards    Increased time secondary to interpretation  Pt also states that when she did something in the past, she felt a lot of pain but not as much now since starting PT. Every time PT performs the manual therapy, she feels as if the right areas are being worked on. The PT also helps with the headache   3.5/10 neck pain after session. Pt also states that the therapy today helped a lot. Pt demonstrates similar pain levels at worst since start of physical therapy. However, based on pt subjective reports, her pain decreases after her sessions and is better able to move her fingers overall.  Moreover, pt reports not having as much pain when trying to perform functional activities at home compared to before starting physical therapy and improved FOTO and Quick Dash scores suggesting improved ability to perform functional tasks. Pt still demonstrates neck and L UE pain, weakness and difficulty performing functional tasks and would benefit from continued skilled physical therapy services to address the aforementioned deficits.          PT Long Term Goals - 12/12/17 1804      PT LONG TERM GOAL #1   Title  Pt will have a decrease in L cervical and L UE pain to 5/10 or less at worst to promote ability to use L UE to perform functional tasks.     Baseline  8/10 at worst. (10/25/2017), (12/12/2017)    Time  4    Period  Weeks    Status  On-going    Target  Date  01/19/18      PT LONG TERM GOAL #2   Title  Pt will improve her L UE strength by at least 1/2 MMT grade to promote ability to perform functional tasks.     Baseline  Left: shoulder flexion 2+/5, elbow flexion 2+/5, wrist extension 2+/5 (10/25/2017); Not tested today secondary to pt stating pain everytime she lifts her L arm (12/12/2017)    Time  4    Period  Weeks    Status  Unable to assess    Target Date  01/19/18      PT LONG TERM GOAL #3   Title  Patient will improve her FOTO score by at least 10 points as a demonstration of improved function.     Baseline  4 (filled out with husband's help 10/25/2017); 32 (with interpreter's help, 12/12/2017)    Time  4    Period  Weeks    Status  Achieved    Target Date  01/19/18      PT LONG TERM GOAL #4   Title  Patient will improve her Quick Dash score by at least 10% as a demonnstration of improved function.     Baseline  88.6% (filled out with husband's help 10/25/2017); 63.6% (with interpreter's help 12/12/2017)    Time  7    Period  Weeks    Status  Achieved      PT LONG TERM GOAL #5   Title  Patient will improve her FOTO score to at least 42 points as a demonstration of improved function.     Baseline  32 (12/12/2017)    Time  4    Period  Weeks    Status  New    Target Date  01/19/18      Additional Long Term Goals   Additional Long Term Goals  Yes      PT LONG TERM GOAL #6   Title  Patient will improve her Quick Dash score to least 53% or less as a demonnstration of improved function.     Baseline  63% (12/12/2017)    Time  4    Period  Weeks    Status  New    Target Date  01/19/18            Plan - 12/12/17 0856    Clinical Impression Statement  3.5/10 neck pain after session. Pt also states that the therapy today helped a lot. Pt demonstrates similar pain levels at worst since start of physical therapy. However, based on pt subjective reports, her pain decreases after her sessions and is better able to move her fingers  overall.  Moreover, pt reports not having as much pain when trying to perform functional activities at home compared to before starting physical therapy and improved FOTO and Quick Dash scores suggesting improved ability to perform functional tasks. Pt still demonstrates neck and L UE pain, weakness and difficulty performing functional tasks and would benefit from continued skilled physical therapy services to address the aforementioned deficits.       Rehab Potential  Fair    Clinical Impairments Affecting Rehab Potential  Chronicity of condition, multiple areas involved, language barrier, pain, weakness, difficulty using her L UE for tasks    PT Frequency  Other (comment) 3 visits in 1 month due to insurance    PT Duration  Other (comment) 3 visits in 1 month due to insurance    PT Treatment/Interventions  Manual techniques;Patient/family education;Therapeutic activities;Therapeutic exercise;Passive range of motion;Dry needling;Neuromuscular re-education;Ultrasound;Electrical Stimulation;Iontophoresis 4mg /ml Dexamethasone;Aquatic Therapy traction if appropriate    PT Next Visit Plan  manual techniques, ROM, gentle strengthening, modalities as needed    Consulted and Agree with Plan of Care  Patient    Family Member Consulted  --       Patient will benefit from skilled therapeutic intervention in order to improve the following deficits and impairments:  Pain, Postural dysfunction, Improper body mechanics, Impaired UE functional use, Decreased strength, Decreased range of motion  Visit Diagnosis: Cervicalgia - Plan: PT plan of care cert/re-cert  Radiculopathy, cervical region - Plan: PT plan of care cert/re-cert  Chronic left shoulder pain - Plan: PT plan of care cert/re-cert  Pain in left arm - Plan: PT plan of care cert/re-cert  Muscle weakness (generalized) - Plan: PT plan of care cert/re-cert  Pain in left elbow - Plan: PT plan of care cert/re-cert  Pain in left forearm - Plan: PT plan  of care cert/re-cert  Pain in left hand - Plan: PT plan of care cert/re-cert     Problem List Patient Active Problem List   Diagnosis Date Noted  . Abnormal brain scan 02/15/2017  . Inflammation of shoulder joint 02/15/2017  . Neck pain 02/15/2017  . Hypertriglyceridemia 11/13/2016  . Blurry vision 05/24/2016  . Chronic tension-type headache, not intractable 05/24/2016  . Acute headache 04/19/2016  . Left arm pain 04/19/2016  . Rotator cuff tear 03/08/2016  . Carpal tunnel syndrome 03/08/2016  . Cervical radiculitis 03/08/2016    Loralyn FreshwaterMiguel Jaquel Glassburn PT, DPT   12/12/2017, 6:20 PM  [facility] [address] [place], [place], [id number] Phone: [phone]   Fax:  [phone]  Name: Harless LittenDiep Haverland MRN: [id number] Date of Birth: 06/03/1956

## 2018-01-02 ENCOUNTER — Ambulatory Visit: Payer: Medicaid Other | Attending: Neurology

## 2018-01-02 DIAGNOSIS — M6281 Muscle weakness (generalized): Secondary | ICD-10-CM | POA: Insufficient documentation

## 2018-01-02 DIAGNOSIS — M79632 Pain in left forearm: Secondary | ICD-10-CM | POA: Insufficient documentation

## 2018-01-02 DIAGNOSIS — M5412 Radiculopathy, cervical region: Secondary | ICD-10-CM | POA: Diagnosis present

## 2018-01-02 DIAGNOSIS — M542 Cervicalgia: Secondary | ICD-10-CM

## 2018-01-02 DIAGNOSIS — M25512 Pain in left shoulder: Secondary | ICD-10-CM | POA: Diagnosis present

## 2018-01-02 DIAGNOSIS — M25522 Pain in left elbow: Secondary | ICD-10-CM | POA: Diagnosis present

## 2018-01-02 DIAGNOSIS — M79642 Pain in left hand: Secondary | ICD-10-CM | POA: Diagnosis present

## 2018-01-02 DIAGNOSIS — G8929 Other chronic pain: Secondary | ICD-10-CM | POA: Insufficient documentation

## 2018-01-02 DIAGNOSIS — M79602 Pain in left arm: Secondary | ICD-10-CM | POA: Diagnosis present

## 2018-01-02 NOTE — Therapy (Signed)
[facility] [address], [place], [address] Phone: [phone]   Fax:  [phone]  Physical Therapy Treatment  Patient Details  Name: Tammy Lam MRN: [id number] Date of Birth: 26-Nov-1955 Referring Provider: Theora Master, MD   Encounter Date: 01/02/2018  PT End of Session - 01/02/18 0934    Visit Number  5    Number of Visits  19    Date for PT Re-Evaluation  02/07/18    Authorization Type  1 of 12    Authorization Time Period  Medicaid    PT Start Time  0935    PT Stop Time  1025    PT Time Calculation (min)  50 min    Activity Tolerance  Patient limited by pain    Behavior During Therapy  [facility] for tasks assessed/performed       Past Medical History:  Diagnosis Date  . Elevated ferritin   . Headache   . Hemochromatosis   . Hyperlipidemia   . Hyponatremia   . Medical history non-contributory   . Shoulder injury     Past Surgical History:  Procedure Laterality Date  . ANKLE SURGERY Right     There were no vitals filed for this visit.  Subjective Assessment - 01/02/18 0936    Subjective  Still has pain in neck and L UE. Currently has pain in the L side of her face. and  L medial shoulder blade. 4/10 pain in all areas currently.     Pertinent History  L arm and shoulder pain: 16 years ago when pt was at work, pt had to lift heavy items. Pt had electric pad treatment. Also went to PT and for some reason, she could not do exercises.   Pt could not move her arm.  2 years ago pt started taking medication.  1 month ago pt started taking injections.  Medication does help however the injection helps with the pain but also has pain in her elbow to her hand and the back of her neck.  Feels pain from elbow down her whole arm and hand. Feels a lot of pain on the palm of her L hand (pt observed to rub her hand a lot along the L C7/C8 dermatome on her hand).  Denies loss of bowel or bladder control. When she moves  her L hand, her pain also affects her head down to her feet on the L side.  Doctor just gave her injections, no mention of surgery.  Also has pain in the L side of her tongue, eye, face.  Pt states that the neurologist knows about it.  Does not think she can exercise her L UE due to pain.  Last PT was 15-16 years ago.   Pt is L hand dominant.     Patient Stated Goals  Pt expresses desire to get better.     Currently in Pain?  Yes    Pain Score  4     Pain Onset  More than a month ago                               PT Education - 01/02/18 1033    Education provided  Yes    Education Details  ther-ex    [employer]) Educated  Patient    Methods  Explanation;Demonstration;Tactile cues;Verbal cues    Comprehension  Returned demonstration;Verbalized understanding  Objectives  Interpreter present Selena Batten)    Manual therapy  Seated STM to Rrhomboid/upper trap muscle.  Supine gentle STM to cervical paraspinal muscles LandR  Supine gentle STM to suboccipital muscles   Pt states the manual therapy helps    (STM to cervical paraspinal muscles and suboccipital muscles performed again after supine exercises which decreased her pain levels from the exercises)   Therapeutic exercise   Reviewed plan of care: 2x/week for 6 weeks secondary to insurance approval.   Supine bilateral scapular retraction 5x2. Pain.  Supine cervical extension 5x2. Pain at the top of her head which eases with rest.  Supine cervical nodding 5x3. Increased symptoms.   Decreased neck pain after STM to neck and suboccipital muscles again.    Improved exercise technique, movement at target joints, use of target muscles after min to mod verbal, visual, tactile cues.    Pt states neck pain decreased to 3.5/10 after session and the manual therapy helps a lot. Overall, pt demonstrates overall improved function based in improved FOTO and Quick Dash scores obtained on  12/12/2017. Pt still demonstrates neck, L face, and UE pain, weakness, and difficulty performing functional tasks and would benefit from continued skilled physical therapy services to address the aforementioned deficits. Challenges to progress include pain, and chronicity of condition. Recert performed today secondary to pt being approved to 12 visits (instead of 3) until 02/07/2018.      PT Long Term Goals - 01/02/18 1049      PT LONG TERM GOAL #1   Title  Pt will have a decrease in L cervical and L UE pain to 5/10 or less at worst to promote ability to use L UE to perform functional tasks.     Baseline  8/10 at worst. (10/25/2017), (12/12/2017)    Time  6    Period  Weeks    Status  On-going    Target Date  02/07/18      PT LONG TERM GOAL #2   Title  Pt will improve her L UE strength by at least 1/2 MMT grade to promote ability to perform functional tasks.     Baseline  Left: shoulder flexion 2+/5, elbow flexion 2+/5, wrist extension 2+/5 (10/25/2017); Not tested today secondary to pt stating pain everytime she lifts her L arm (12/12/2017)    Time  6    Period  Weeks    Status  Unable to assess    Target Date  02/07/18      PT LONG TERM GOAL #3   Title  Patient will improve her FOTO score by at least 10 points as a demonstration of improved function.     Baseline  4 (filled out with husband's help 10/25/2017); 32 (with interpreter's help, 12/12/2017)    Time  4    Period  Weeks    Status  Achieved      PT LONG TERM GOAL #4   Title  Patient will improve her Quick Dash score by at least 10% as a demonnstration of improved function.     Baseline  88.6% (filled out with husband's help 10/25/2017); 63.6% (with interpreter's help 12/12/2017)    Time  7    Period  Weeks    Status  Achieved      PT LONG TERM GOAL #5   Title  Patient will improve her FOTO score to at least 42 points as a demonstration of improved function.     Baseline  32 (12/12/2017)  Time  6    Period  Weeks    Status  New     Target Date  02/07/18      PT LONG TERM GOAL #6   Title  Patient will improve her Quick Dash score to least 53% or less as a demonnstration of improved function.     Baseline  63% (12/12/2017)    Time  6    Period  Weeks    Status  New    Target Date  02/07/18            Plan - 01/02/18 0932    Clinical Impression Statement  Pt states neck pain decreased to 3.5/10 after session and the manual therapy helps a lot. Overall, pt demonstrates overall improved function based in improved FOTO and Quick Dash scores obtained on 12/12/2017. Pt still demonstrates neck, L face, and UE pain, weakness, and difficulty performing functional tasks and would benefit from continued skilled physical therapy services to address the aforementioned deficits. Challenges to progress include pain, and chronicity of condition. Recert performed today secondary to pt being approved to 12 visits (instead of 3) until 02/07/2018.      History and Personal Factors relevant to plan of care:  Chronicity of condition, multiple areas involved, language barrier, pain, weakness, difficulty using her L UE for tasks.     Clinical Presentation  Stable    Clinical Presentation due to:  decreased pain after PT sessions    Clinical Decision Making  Low    Rehab Potential  Fair    Clinical Impairments Affecting Rehab Potential  Chronicity of condition, multiple areas involved, language barrier, pain, weakness, difficulty using her L UE for tasks    PT Frequency  2x / week    PT Duration  6 weeks    PT Treatment/Interventions  Manual techniques;Patient/family education;Therapeutic activities;Therapeutic exercise;Passive range of motion;Dry needling;Neuromuscular re-education;Ultrasound;Electrical Stimulation;Iontophoresis /ml Dexamethasone;Aquatic Therapy traction if appropriate    PT Next Visit Plan  manual techniques, ROM, gentle strengthening, modalities as needed    Consulted and Agree with Plan of Care  Patient        Patient will benefit from skilled therapeutic intervention in order to improve the following deficits and impairments:  Pain, Postural dysfunction, Improper body mechanics, Impaired UE functional use, Decreased strength, Decreased range of motion  Visit Diagnosis: Cervicalgia - Plan: PT plan of care cert/re-cert  Radiculopathy, cervical region - Plan: PT plan of care cert/re-cert  Chronic left shoulder pain - Plan: PT plan of care cert/re-cert  Pain in left arm - Plan: PT plan of care cert/re-cert  Muscle weakness (generalized) - Plan: PT plan of care cert/re-cert  Pain in left elbow - Plan: PT plan of care cert/re-cert  Pain in left hand - Plan: PT plan of care cert/re-cert  Pain in left forearm - Plan: PT plan of care cert/re-cert     Problem List Patient Active Problem List   Diagnosis Date Noted  . Abnormal brain scan 02/15/2017  . Inflammation of shoulder joint 02/15/2017  . Neck pain 02/15/2017  . Hypertriglyceridemia 11/13/2016  . Blurry vision 05/24/2016  . Chronic tension-type headache, not intractable 05/24/2016  . Acute headache 04/19/2016  . Left arm pain 04/19/2016  . Rotator cuff tear 03/08/2016  . Carpal tunnel syndrome 03/08/2016  . Cervical radiculitis 03/08/2016    Loralyn Freshwater PT, DPT   01/02/2018, 10:54 AM  [place] [facility] SPORTS MEDICINE [address] [place], [address]  Phone: [phone]   Fax:  [phone]  Name: Tammy Lam MRN: [id number] Date of Birth: September 24, 1955

## 2018-01-04 ENCOUNTER — Ambulatory Visit: Payer: Medicaid Other

## 2018-01-04 DIAGNOSIS — M25522 Pain in left elbow: Secondary | ICD-10-CM

## 2018-01-04 DIAGNOSIS — M25512 Pain in left shoulder: Secondary | ICD-10-CM

## 2018-01-04 DIAGNOSIS — G8929 Other chronic pain: Secondary | ICD-10-CM

## 2018-01-04 DIAGNOSIS — M542 Cervicalgia: Secondary | ICD-10-CM

## 2018-01-04 DIAGNOSIS — M79602 Pain in left arm: Secondary | ICD-10-CM

## 2018-01-04 DIAGNOSIS — M5412 Radiculopathy, cervical region: Secondary | ICD-10-CM

## 2018-01-04 DIAGNOSIS — M79632 Pain in left forearm: Secondary | ICD-10-CM

## 2018-01-04 DIAGNOSIS — M79642 Pain in left hand: Secondary | ICD-10-CM

## 2018-01-04 DIAGNOSIS — M6281 Muscle weakness (generalized): Secondary | ICD-10-CM

## 2018-01-04 NOTE — Patient Instructions (Addendum)
Pt was recommended to use a heating pad with cloth barrier (so not too warm) to L scalene and teres major muscle area to help decrease tension. Pt verbalized understanding. Interpreter used.

## 2018-01-04 NOTE — Therapy (Signed)
[facility]INE [address] [place], [address] Phone: [phone]   Fax:  [phone]  Physical Therapy Treatment  Patient Details  Name: Tammy Lam MRN: [id number] Date of Birth: 1956-06-21 Referring Provider: Theora Master, MD   Encounter Date: 01/04/2018  PT End of Session - 01/04/18 1442    Visit Number  6    Number of Visits  19    Date for PT Re-Evaluation  02/07/18    Authorization Type  2 of 12    Authorization Time Period  Medicaid    PT Start Time  1442    PT Stop Time  1534    PT Time Calculation (min)  52 min    Activity Tolerance  Patient limited by pain    Behavior During Therapy  [facility] for tasks assessed/performed       Past Medical History:  Diagnosis Date  . Elevated ferritin   . Headache   . Hemochromatosis   . Hyperlipidemia   . Hyponatremia   . Medical history non-contributory   . Shoulder injury     Past Surgical History:  Procedure Laterality Date  . ANKLE SURGERY Right     There were no vitals filed for this visit.  Subjective Assessment - 01/04/18 1444    Subjective  Currently in pain L neck and L UE. Above 4/10 currently. The treatment last session helped. Feels fine if she does not move. But if she uses a chopstick to pick up food, she feels pain again.     Pertinent History  L arm and shoulder pain: 16 years ago when pt was at work, pt had to lift heavy items. Pt had electric pad treatment. Also went to PT and for some reason, she could not do exercises.   Pt could not move her arm.  2 years ago pt started taking medication.  1 month ago pt started taking injections.  Medication does help however the injection helps with the pain but also has pain in her elbow to her hand and the back of her neck.  Feels pain from elbow down her whole arm and hand. Feels a lot of pain on the palm of her L hand (pt observed to rub her hand a lot along the L C7/C8 dermatome on her hand).  Denies  loss of bowel or bladder control. When she moves her L hand, her pain also affects her head down to her feet on the L side.  Doctor just gave her injections, no mention of surgery.  Also has pain in the L side of her tongue, eye, face.  Pt states that the neurologist knows about it.  Does not think she can exercise her L UE due to pain.  Last PT was 15-16 years ago.   Pt is L hand dominant.     Patient Stated Goals  Pt expresses desire to get better.     Currently in Pain?  Yes    Pain Score  4  above 4/10    Pain Onset  More than a month ago                               PT Education - 01/04/18 1541    Education provided  Yes    Education Details  heating pad to decrease muscle tension    Person(s) Educated  Patient    Methods  Explanation;Tactile cues  telephone interpreter    Comprehension  Verbalized understanding         Objectives  telephone interpreter used secondary to no physical interpreter available Language line interpreter [phone]      Manual therapy    Seated STM to bilateral cervical paraspinal muscles. Decreased L eye pain.   Seated STM to Rrhomboid/upper trap muscle.  Seated STM to teres major on L. Decreased L arm pain  STM to L first rib/ scalene area. Pt states able to lift L arm up a little.   Static caudal pressure to L first rib.     Decreased pain to 3/10. Better able to open her L eye and raise her L arm up a little and can smile after treatment. 37 degrees L shoulder flexion AROM after session. Pt observed to have difficulty raising her L arm up, bend her L elbow, or move her hand at start of session due to pain. Pt better able to perform aforementioned movements (shoulder flexion, elbow flexion, opening and closing hand) as well as opening her L eye after manual therapy to decrease tension to bilateral cervical paraspinal muscles R upper trap/rhomboid, L scalene, L teres major muscle area as well as with caudal  pressure to L first rib to decrease pressure to L UE nerves. Manual therapy only performed during today's session secondary to pt movement and comfort level responding the best with this treatment.          PT Long Term Goals - 01/02/18 1049      PT LONG TERM GOAL #1   Title  Pt will have a decrease in L cervical and L UE pain to 5/10 or less at worst to promote ability to use L UE to perform functional tasks.     Baseline  8/10 at worst. (10/25/2017), (12/12/2017)    Time  6    Period  Weeks    Status  On-going    Target Date  02/07/18      PT LONG TERM GOAL #2   Title  Pt will improve her L UE strength by at least 1/2 MMT grade to promote ability to perform functional tasks.     Baseline  Left: shoulder flexion 2+/5, elbow flexion 2+/5, wrist extension 2+/5 (10/25/2017); Not tested today secondary to pt stating pain everytime she lifts her L arm (12/12/2017)    Time  6    Period  Weeks    Status  Unable to assess    Target Date  02/07/18      PT LONG TERM GOAL #3   Title  Patient will improve her FOTO score by at least 10 points as a demonstration of improved function.     Baseline  4 (filled out with husband's help 10/25/2017); 32 (with interpreter's help, 12/12/2017)    Time  4    Period  Weeks    Status  Achieved      PT LONG TERM GOAL #4   Title  Patient will improve her Quick Dash score by at least 10% as a demonnstration of improved function.     Baseline  88.6% (filled out with husband's help 10/25/2017); 63.6% (with interpreter's help 12/12/2017)    Time  7    Period  Weeks    Status  Achieved      PT LONG TERM GOAL #5   Title  Patient will improve her FOTO score to at least 42 points as a demonstration of improved function.     Baseline  32 (12/12/2017)    Time  6    Period  Weeks    Status  New    Target Date  02/07/18      PT LONG TERM GOAL #6   Title  Patient will improve her Quick Dash score to least 53% or less as a demonnstration of improved function.      Baseline  63% (12/12/2017)    Time  6    Period  Weeks    Status  New    Target Date  02/07/18            Plan - 01/04/18 1438    Clinical Impression Statement  Decreased pain to 3/10. Better able to open her L eye and raise her L arm up a little and can smile after treatment. 37 degrees L shoulder flexion AROM after session. Pt observed to have difficulty raising her L arm up, bend her L elbow, or move her hand at start of session due to pain. Pt better able to perform aforementioned movements (shoulder flexion, elbow flexion, opening and closing hand) as well as opening her L eye after manual therapy to decrease tension to bilateral cervical paraspinal muscles R upper trap/rhomboid, L scalene, L teres major muscle area as well as with caudal pressure to L first rib to decrease pressure to L UE nerves. Manual therapy only performed during today's session secondary to pt movement and comfort level responding the best with this treatment.     Rehab Potential  Fair    Clinical Impairments Affecting Rehab Potential  Chronicity of condition, multiple areas involved, language barrier, pain, weakness, difficulty using her L UE for tasks    PT Frequency  2x / week    PT Duration  6 weeks    PT Treatment/Interventions  Manual techniques;Patient/family education;Therapeutic activities;Therapeutic exercise;Passive range of motion;Dry needling;Neuromuscular re-education;Ultrasound;Electrical Stimulation;Iontophoresis /ml Dexamethasone;Aquatic Therapy traction if appropriate    PT Next Visit Plan  manual techniques, ROM, gentle strengthening, modalities as needed    Consulted and Agree with Plan of Care  Patient       Patient will benefit from skilled therapeutic intervention in order to improve the following deficits and impairments:  Pain, Postural dysfunction, Improper body mechanics, Impaired UE functional use, Decreased strength, Decreased range of motion  Visit  Diagnosis: Cervicalgia  Radiculopathy, cervical region  Chronic left shoulder pain  Pain in left arm  Muscle weakness (generalized)  Pain in left elbow  Pain in left hand  Pain in left forearm     Problem List Patient Active Problem List   Diagnosis Date Noted  . Abnormal brain scan 02/15/2017  . Inflammation of shoulder joint 02/15/2017  . Neck pain 02/15/2017  . Hypertriglyceridemia 11/13/2016  . Blurry vision 05/24/2016  . Chronic tension-type headache, not intractable 05/24/2016  . Acute headache 04/19/2016  . Left arm pain 04/19/2016  . Rotator cuff tear 03/08/2016  . Carpal tunnel syndrome 03/08/2016  . Cervical radiculitis 03/08/2016    Loralyn Freshwater PT, DPT   01/04/2018, 3:44 PM  [facility] PHYS[facility] SPORTS MEDICINE [address] [place], [address] Phone: [phone]   Fax:  [phone]  Name: Tammy Lam MRN: [id number] Date of Birth: March 08, 1956

## 2018-01-09 ENCOUNTER — Ambulatory Visit: Payer: Medicaid Other

## 2018-01-09 DIAGNOSIS — M6281 Muscle weakness (generalized): Secondary | ICD-10-CM

## 2018-01-09 DIAGNOSIS — M79602 Pain in left arm: Secondary | ICD-10-CM

## 2018-01-09 DIAGNOSIS — M542 Cervicalgia: Secondary | ICD-10-CM

## 2018-01-09 DIAGNOSIS — G8929 Other chronic pain: Secondary | ICD-10-CM

## 2018-01-09 DIAGNOSIS — M25512 Pain in left shoulder: Secondary | ICD-10-CM

## 2018-01-09 DIAGNOSIS — M79632 Pain in left forearm: Secondary | ICD-10-CM

## 2018-01-09 DIAGNOSIS — M79642 Pain in left hand: Secondary | ICD-10-CM

## 2018-01-09 DIAGNOSIS — M5412 Radiculopathy, cervical region: Secondary | ICD-10-CM

## 2018-01-09 DIAGNOSIS — M25522 Pain in left elbow: Secondary | ICD-10-CM

## 2018-01-09 NOTE — Therapy (Signed)
[facility] [address] [place], [address] Phone: [phone]   Fax:  [phone]  Physical Therapy Treatment  Patient Details  Name: Tammy Lam MRN: [id number] Date of Birth: November 05, 1955 Referring Provider: Theora Master, MD   Encounter Date: 01/09/2018  PT End of Session - 01/09/18 1016    Visit Number  7    Number of Visits  19    Date for PT Re-Evaluation  02/07/18    Authorization Type  3 of 12    Authorization Time Period  Medicaid    PT Start Time  1016    PT Stop Time  1100    PT Time Calculation (min)  44 min    Activity Tolerance  Patient limited by pain    Behavior During Therapy  [facility] for tasks assessed/performed       Past Medical History:  Diagnosis Date  . Elevated ferritin   . Headache   . Hemochromatosis   . Hyperlipidemia   . Hyponatremia   . Medical history non-contributory   . Shoulder injury     Past Surgical History:  Procedure Laterality Date  . ANKLE SURGERY Right     There were no vitals filed for this visit.  Subjective Assessment - 01/09/18 1018    Subjective  Feels better. Started light cooking. 3.5/10 L neck and L UE currently.     Pertinent History  L arm and shoulder pain: 16 years ago when pt was at work, pt had to lift heavy items. Pt had electric pad treatment. Also went to PT and for some reason, she could not do exercises.   Pt could not move her arm.  2 years ago pt started taking medication.  1 month ago pt started taking injections.  Medication does help however the injection helps with the pain but also has pain in her elbow to her hand and the back of her neck.  Feels pain from elbow down her whole arm and hand. Feels a lot of pain on the palm of her L hand (pt observed to rub her hand a lot along the L C7/C8 dermatome on her hand).  Denies loss of bowel or bladder control. When she moves her L hand, her pain also affects her head down to her feet on the L  side.  Doctor just gave her injections, no mention of surgery.  Also has pain in the L side of her tongue, eye, face.  Pt states that the neurologist knows about it.  Does not think she can exercise her L UE due to pain.  Last PT was 15-16 years ago.   Pt is L hand dominant.     Patient Stated Goals  Pt expresses desire to get better.     Currently in Pain?  Yes    Pain Score  4  3.5/10    Pain Onset  More than a month ago                                   Objectives  Interpreter Selena Batten) present   Manual therapy   Supine STM to bilateral cervical paraspinal muscles. Increased symptoms. More comfortable when performed in sitting.   Seated STM to bilateral cervical paraspinal muscles.   Seated STM to Rrhomboid/upper trap muscle.  Seated STM to teres major on L.   Seated STM to L rhomboid area to  decrease tension   STM to L first rib/ scalene area. Pt states able to lift L arm up a little.   Static caudal pressure to L first rib.     Try diaphragmatic breathing next session if appropriate.    Continued working on decreasing cervical paraspinal, rhomboid/upper trap, L scalene muscle tension and promoting caudal pressure to L first rib area (to decrease pressure to L UE nerves) secondary to pt reports of the manual therapy helping and being better able to perform light cooking after last session. Pain decreased to 3/10 after session.       PT Long Term Goals - 01/02/18 1049      PT LONG TERM GOAL #1   Title  Pt will have a decrease in L cervical and L UE pain to 5/10 or less at worst to promote ability to use L UE to perform functional tasks.     Baseline  8/10 at worst. (10/25/2017), (12/12/2017)    Time  6    Period  Weeks    Status  On-going    Target Date  02/07/18      PT LONG TERM GOAL #2   Title  Pt will improve her L UE strength by at least 1/2 MMT grade to promote ability to perform functional tasks.     Baseline   Left: shoulder flexion 2+/5, elbow flexion 2+/5, wrist extension 2+/5 (10/25/2017); Not tested today secondary to pt stating pain everytime she lifts her L arm (12/12/2017)    Time  6    Period  Weeks    Status  Unable to assess    Target Date  02/07/18      PT LONG TERM GOAL #3   Title  Patient will improve her FOTO score by at least 10 points as a demonstration of improved function.     Baseline  4 (filled out with husband's help 10/25/2017); 32 (with interpreter's help, 12/12/2017)    Time  4    Period  Weeks    Status  Achieved      PT LONG TERM GOAL #4   Title  Patient will improve her Quick Dash score by at least 10% as a demonnstration of improved function.     Baseline  88.6% (filled out with husband's help 10/25/2017); 63.6% (with interpreter's help 12/12/2017)    Time  7    Period  Weeks    Status  Achieved      PT LONG TERM GOAL #5   Title  Patient will improve her FOTO score to at least 42 points as a demonstration of improved function.     Baseline  32 (12/12/2017)    Time  6    Period  Weeks    Status  New    Target Date  02/07/18      PT LONG TERM GOAL #6   Title  Patient will improve her Quick Dash score to least 53% or less as a demonnstration of improved function.     Baseline  63% (12/12/2017)    Time  6    Period  Weeks    Status  New    Target Date  02/07/18            Plan - 01/09/18 1012    Clinical Impression Statement  Continued working on decreasing cervical paraspinal, rhomboid/upper trap, L scalene muscle tension and promoting caudal pressure to L first rib area (to decrease pressure to L UE nerves) secondary to pt  reports of the manual therapy helping and being better able to perform light cooking after last session. Pain decreased to 3/10 after session.     Rehab Potential  Fair    Clinical Impairments Affecting Rehab Potential  Chronicity of condition, multiple areas involved, language barrier, pain, weakness, difficulty using her L UE for tasks     PT Frequency  2x / week    PT Duration  6 weeks    PT Treatment/Interventions  Manual techniques;Patient/family education;Therapeutic activities;Therapeutic exercise;Passive range of motion;Dry needling;Neuromuscular re-education;Ultrasound;Electrical Stimulation;Iontophoresis /ml Dexamethasone;Aquatic Therapy traction if appropriate    PT Next Visit Plan  manual techniques, ROM, gentle strengthening, modalities as needed    Consulted and Agree with Plan of Care  Patient       Patient will benefit from skilled therapeutic intervention in order to improve the following deficits and impairments:  Pain, Postural dysfunction, Improper body mechanics, Impaired UE functional use, Decreased strength, Decreased range of motion  Visit Diagnosis: Cervicalgia  Radiculopathy, cervical region  Chronic left shoulder pain  Pain in left arm  Muscle weakness (generalized)  Pain in left elbow  Pain in left hand  Pain in left forearm     Problem List Patient Active Problem List   Diagnosis Date Noted  . Abnormal brain scan 02/15/2017  . Inflammation of shoulder joint 02/15/2017  . Neck pain 02/15/2017  . Hypertriglyceridemia 11/13/2016  . Blurry vision 05/24/2016  . Chronic tension-type headache, not intractable 05/24/2016  . Acute headache 04/19/2016  . Left arm pain 04/19/2016  . Rotator cuff tear 03/08/2016  . Carpal tunnel syndrome 03/08/2016  . Cervical radiculitis 03/08/2016   Loralyn Freshwater PT, DPT   01/09/2018, 11:14 AM   [facility] PHYSICAL AND SPORTS MEDICINE [address], [place], [address] Phone: [phone]   Fax:  [phone]  Name: Tammy Lam MRN: [id number] Date of Birth: [birth year]

## 2018-01-10 ENCOUNTER — Inpatient Hospital Stay: Payer: Medicaid Other | Attending: Internal Medicine

## 2018-01-10 DIAGNOSIS — M1990 Unspecified osteoarthritis, unspecified site: Secondary | ICD-10-CM | POA: Insufficient documentation

## 2018-01-10 LAB — CBC WITH DIFFERENTIAL/PLATELET
BASOS ABS: 0 10*3/uL (ref 0–0.1)
Basophils Relative: 1 %
Eosinophils Absolute: 0.1 10*3/uL (ref 0–0.7)
Eosinophils Relative: 3 %
HEMATOCRIT: 44.2 % (ref 35.0–47.0)
HEMOGLOBIN: 15.2 g/dL (ref 12.0–16.0)
LYMPHS PCT: 47 %
Lymphs Abs: 2.2 10*3/uL (ref 1.0–3.6)
MCH: 31.3 pg (ref 26.0–34.0)
MCHC: 34.5 g/dL (ref 32.0–36.0)
MCV: 90.7 fL (ref 80.0–100.0)
Monocytes Absolute: 0.3 10*3/uL (ref 0.2–0.9)
Monocytes Relative: 6 %
Neutro Abs: 2 10*3/uL (ref 1.4–6.5)
Neutrophils Relative %: 43 %
PLATELETS: 150 10*3/uL (ref 150–440)
RBC: 4.88 MIL/uL (ref 3.80–5.20)
RDW: 12.4 % (ref 11.5–14.5)
WBC: 4.6 10*3/uL (ref 3.6–11.0)

## 2018-01-10 LAB — COMPREHENSIVE METABOLIC PANEL
ALT: 29 U/L (ref 14–54)
AST: 34 U/L (ref 15–41)
Albumin: 4.3 g/dL (ref 3.5–5.0)
Alkaline Phosphatase: 100 U/L (ref 38–126)
Anion gap: 9 (ref 5–15)
BILIRUBIN TOTAL: 1.1 mg/dL (ref 0.3–1.2)
BUN: 14 mg/dL (ref 6–20)
CO2: 24 mmol/L (ref 22–32)
CREATININE: 0.73 mg/dL (ref 0.44–1.00)
Calcium: 9.1 mg/dL (ref 8.9–10.3)
Chloride: 106 mmol/L (ref 101–111)
GFR calc Af Amer: >60 mL/min (ref >60)
Glucose, Bld: 97 mg/dL (ref 65–99)
Potassium: 3.9 mmol/L (ref 3.5–5.1)
Sodium: 139 mmol/L (ref 135–145)
TOTAL PROTEIN: 7.1 g/dL (ref 6.5–8.1)

## 2018-01-10 LAB — IRON AND TIBC
Iron: 156 ug/dL (ref 28–170)
Saturation Ratios: 45 % — ABNORMAL HIGH (ref 10.4–31.8)
TIBC: 346 ug/dL (ref 250–450)
UIBC: 190 ug/dL

## 2018-01-10 LAB — FERRITIN: FERRITIN: 340 ng/mL — AB (ref 11–307)

## 2018-01-17 ENCOUNTER — Encounter: Payer: Self-pay | Admitting: Internal Medicine

## 2018-01-17 ENCOUNTER — Other Ambulatory Visit: Payer: Self-pay

## 2018-01-17 ENCOUNTER — Inpatient Hospital Stay: Payer: Medicaid Other

## 2018-01-17 ENCOUNTER — Inpatient Hospital Stay (HOSPITAL_BASED_OUTPATIENT_CLINIC_OR_DEPARTMENT_OTHER): Payer: Medicaid Other | Admitting: Internal Medicine

## 2018-01-17 DIAGNOSIS — M1990 Unspecified osteoarthritis, unspecified site: Secondary | ICD-10-CM | POA: Diagnosis not present

## 2018-01-17 NOTE — Progress Notes (Signed)
[facility] CONSULT NOTE  Patient Care Team: Tammy Code, MD as PCP - General (Internal Medicine)  CHIEF COMPLAINTS/PURPOSE OF CONSULTATION:   # APRIL 2017-HETEROZYGOUS H63D HEMOCHROMATOSIS MUTATION; ELEVATED FERRITIN Tammy Lam [id number]; N-150]; sat-38%; LFTs- Unremarkable; Hb 15/platlets- 155; creat- 0.74; April 2017- Ferritin- 332- No Phlebotomy.   # CHRONIC JOINT PAINS [? Left side > 10 years ]  HISTORY OF PRESENTING ILLNESS: Accompanied by interpreter.  Tammy Lam 62 y.o.  Lam with Tammy Lam patient is here for follow-up for her hemochromatosis.  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  This is chronic not new.   Denies any skin rash or skin itch.  Denies any swelling in the legs.  Review of Systems  Constitutional: Positive for malaise/fatigue. Negative for chills, diaphoresis, fever and weight loss.  HENT: Negative for nosebleeds and sore throat.   Eyes: Negative for double vision.  Respiratory: Negative for cough, hemoptysis, sputum production, shortness of breath and wheezing.   Cardiovascular: Negative for chest pain, palpitations, orthopnea and leg swelling.  Gastrointestinal: Negative for abdominal pain, blood in stool, constipation, diarrhea, heartburn, melena, nausea and vomiting.  Genitourinary: Negative for dysuria, frequency and urgency.  Musculoskeletal: Positive for back pain and joint pain.  Skin: Negative.  Negative for itching and rash.  Neurological: Negative for dizziness, tingling, focal weakness, weakness and headaches.  Endo/Heme/Allergies: Does not bruise/bleed easily.  Psychiatric/Behavioral: Negative for depression. The patient is not nervous/anxious and does not have insomnia.        MEDICAL HISTORY:  Past Medical History:  Diagnosis Date  . Elevated ferritin   . Headache   . Hemochromatosis   . Hyperlipidemia   . Hyponatremia   . Medical history non-contributory   .  Shoulder injury     SURGICAL HISTORY: Past Surgical History:  Procedure Laterality Date  . ANKLE SURGERY Right     SOCIAL HISTORY: originally from [place]; moved from [place] appx 1 year ago.  Social History   Socioeconomic History  . Marital status: Married    Spouse name: Not on file  . Number of children: Not on file  . Years of education: Not on file  . Highest education level: Not on file  Occupational History  . Not on file  Social Needs  . Financial resource strain: Not on file  . Food insecurity:    Worry: Not on file    Inability: Not on file  . Transportation needs:    Medical: Not on file    Non-medical: Not on file  Tobacco Use  . Smoking status: Never Smoker  . Smokeless tobacco: Never Used  Substance and Sexual Activity  . Alcohol use: No    Alcohol/week: 0.0 oz  . Drug use: No  . Sexual activity: Yes    Partners: Male  Lifestyle  . Physical activity:    Days per week: Not on file    Minutes per session: Not on file  . Stress: Not on file  Relationships  . Social connections:    Talks on phone: Not on file    Gets together: Not on file    Attends religious service: Not on file    Active member of club or organization: Not on file    Attends meetings of clubs or organizations: Not on file    Relationship status: Not on file  . Intimate partner violence:    Fear of current or ex partner: Not on  file    Emotionally abused: Not on file    Physically abused: Not on file    Forced sexual activity: Not on file  Other Topics Concern  . Not on file  Social History Narrative  . Not on file    FAMILY HISTORY: No family in no family history of liver disease or liver cancers.   ALLERGIES:  is allergic to benadryl [diphenhydramine hcl]; influenza vaccines; and penicillins.  MEDICATIONS:  Current Outpatient Medications  Medication Sig Dispense Refill  . fenofibrate (TRICOR) 145 MG tablet Take 1 tablet by mouth daily.    Marland Kitchen gabapentin (NEURONTIN)  100 MG capsule Take 1 capsule by mouth 4 (four) times daily.    Marland Kitchen ibuprofen (ADVIL,MOTRIN) 600 MG tablet Take by mouth.    . meloxicam (MOBIC) 15 MG tablet TAKE ONE TAB IN AM AFTER BREAKFAST 30 tablet 3   No current facility-administered medications for this visit.       Marland Kitchen  PHYSICAL EXAMINATION: ECOG PERFORMANCE STATUS: 0 - Asymptomatic  Vitals:   01/17/18 0904  BP: 120/78  Pulse: 75  Resp: 20  Temp: 97.9 F (36.6 C)   Filed Weights   01/17/18 0904  Weight: 141 lb 12.1 oz (64.3 kg)    GENERAL: Well-nourished well-developed; Alert, no distress and comfortable.  Accompanied by family.  EYES: no pallor or icterus OROPHARYNX: no thrush or ulceration; NECK: supple; no lymph nodes felt. LYMPH:  no palpable lymphadenopathy in the axillary or inguinal regions LUNGS: Decreased breath sounds auscultation bilaterally. No wheeze or crackles HEART/CVS: regular rate & rhythm and no murmurs; No lower extremity edema ABDOMEN:abdomen soft, non-tender and normal bowel sounds. No hepatomegaly or splenomegaly.  Musculoskeletal:no cyanosis of digits and no clubbing  PSYCH: alert & oriented x 3 with fluent speech NEURO: no focal motor/sensory deficits SKIN:  no rashes or significant lesions   LABORATORY DATA:  I have reviewed the data as listed Lab Results  Component Value Date   WBC 4.6 01/10/2018   HGB 15.2 01/10/2018   HCT 44.2 01/10/2018   MCV 90.7 01/10/2018   PLT 150 01/10/2018   Recent Labs    01/10/18 0803  NA 139  K 3.9  CL 106  CO2 24  GLUCOSE 97  BUN 14  CREATININE 0.73  CALCIUM 9.1  GFRNONAA >60  GFRAA >60  PROT 7.1  ALBUMIN 4.3  AST 34  ALT 29  ALKPHOS 100  BILITOT 1.1   Results for Lam, Tammy (MRN [id number]) as of 01/18/2017 09:33  Ref. Range 12/02/2015 14:28 01/14/2017 09:26  Iron Latest Ref Range: 28 - 170 ug/dL  409  UIBC Latest Units: ug/dL  811  TIBC Latest Ref Range: 250 - 450 ug/dL  914  Saturation Ratios Latest Ref Range: 10.4 - 31.8 %  29   Ferritin Latest Ref Range: 11 - 307 ng/mL 332 (H) 387 (H)    ASSESSMENT & PLAN:  Hemochromatosis, hereditary (HCC) # Primary- hereditary hemochromatosis. H63D Heterozygous [Elevated ferritin  340; saturation 46%].  Asymptomatic without any concerns for iron overload.  Recommend phlebotomy if ferritin >1000; saturation given 60.  Stable  #Again reviewed with the patient to avoid iron supplementation.  # follow up in 1 year- with labs/ 1 week prior; if ferritin still elevated then recommend Phlebotomy.   # 15 minutes face-to-face with the patient discussing the above plan of care; more than 50% of time spent on counseling and coordination through interpreter.   Cc: Ms. Maisie Fus  Donneta Romberg, MD 01/17/2018 9:21 AM

## 2018-01-17 NOTE — Progress Notes (Signed)
Patient accompanied by hospital interpreter - Lamar Benes - [place] interpreter.

## 2018-01-17 NOTE — Assessment & Plan Note (Signed)
#   Primary- hereditary hemochromatosis. H63D Heterozygous [Elevated ferritin  340; saturation 46%].  Asymptomatic without any concerns for iron overload.  Recommend phlebotomy if ferritin >1000; saturation given 60.  Stable  #Again reviewed with the patient to avoid iron supplementation.  # follow up in 1 year- with labs/ 1 week prior; if ferritin still elevated then recommend Phlebotomy.   # 15 minutes face-to-face with the patient discussing the above plan of care; more than 50% of time spent on counseling and coordination through interpreter.   Cc: [name]

## 2018-01-18 ENCOUNTER — Ambulatory Visit: Payer: Medicaid Other

## 2018-01-18 DIAGNOSIS — M542 Cervicalgia: Secondary | ICD-10-CM

## 2018-01-18 DIAGNOSIS — M25522 Pain in left elbow: Secondary | ICD-10-CM

## 2018-01-18 DIAGNOSIS — M79602 Pain in left arm: Secondary | ICD-10-CM

## 2018-01-18 DIAGNOSIS — M79632 Pain in left forearm: Secondary | ICD-10-CM

## 2018-01-18 DIAGNOSIS — M25512 Pain in left shoulder: Secondary | ICD-10-CM

## 2018-01-18 DIAGNOSIS — M5412 Radiculopathy, cervical region: Secondary | ICD-10-CM

## 2018-01-18 DIAGNOSIS — M79642 Pain in left hand: Secondary | ICD-10-CM

## 2018-01-18 DIAGNOSIS — M6281 Muscle weakness (generalized): Secondary | ICD-10-CM

## 2018-01-18 DIAGNOSIS — G8929 Other chronic pain: Secondary | ICD-10-CM

## 2018-01-18 NOTE — Therapy (Signed)
[facility] [address], [place], [address] Phone: [phone]   Fax:  [phone]  Physical Therapy Treatment  Patient Details  Name: Tammy Lam MRN: [id number] Date of Birth: Jan 13, 1956 Referring Provider: Theora Master, MD   Encounter Date: 01/18/2018  PT End of Session - 01/18/18 0903    Visit Number  8    Number of Visits  19    Date for PT Re-Evaluation  02/07/18    Authorization Type  4 of 12    Authorization Time Period  Medicaid    PT Start Time  0903    PT Stop Time  0946    PT Time Calculation (min)  43 min    Activity Tolerance  Patient limited by pain    Behavior During Therapy  [facility] for tasks assessed/performed       Past Medical History:  Diagnosis Date  . Elevated ferritin   . Headache   . Hemochromatosis   . Hyperlipidemia   . Hyponatremia   . Medical history non-contributory   . Shoulder injury     Past Surgical History:  Procedure Laterality Date  . ANKLE SURGERY Right     There were no vitals filed for this visit.  Subjective Assessment - 01/18/18 0904    Subjective  At home she feels better but when gets out of the car to get to PT, she feels worse. Moving her head around bothers her. 3.5/10 currently.     Pertinent History  L arm and shoulder pain: 16 years ago when pt was at work, pt had to lift heavy items. Pt had electric pad treatment. Also went to PT and for some reason, she could not do exercises.   Pt could not move her arm.  2 years ago pt started taking medication.  1 month ago pt started taking injections.  Medication does help however the injection helps with the pain but also has pain in her elbow to her hand and the back of her neck.  Feels pain from elbow down her whole arm and hand. Feels a lot of pain on the palm of her L hand (pt observed to rub her hand a lot along the L C7/C8 dermatome on her hand).  Denies loss of bowel or bladder control. When she moves her  L hand, her pain also affects her head down to her feet on the L side.  Doctor just gave her injections, no mention of surgery.  Also has pain in the L side of her tongue, eye, face.  Pt states that the neurologist knows about it.  Does not think she can exercise her L UE due to pain.  Last PT was 15-16 years ago.   Pt is L hand dominant.     Patient Stated Goals  Pt expresses desire to get better.     Currently in Pain?  Yes    Pain Score  4  3.5/10    Pain Onset  More than a month ago                               PT Education - 01/18/18 0946    Education provided  Yes    Education Details  ther-ex, HEP    Person(s) Educated  Patient    Methods  Explanation;Demonstration;Tactile cues;Verbal cues    Comprehension  Verbalized understanding;Returned demonstration  Objectives  Interpreter Selena Batten) present   Manual therapy   Seated STM to bilateral cervical paraspinal muscles.   Seated STM to Rrhomboid/upper trap muscle.    Therapeutic exercise  Seated gentle manually resisted R scapular protraction isometrics, manually resisted by PT 10x5 seconds for 3 sets to promote reciprocal inhibition of R rhomboid muscle to decrease tension to lower cervical spine. Pt states the exercise helps  Seated manually resisted L scapular retraction targeting the lower trap muscles 10x5 seconds for 3 sets. Reviewed with husband as well. Husband and pt demonstrated and verbalized understanding.   Seated L scapular depression isometrics, gentle resistance from PT 10x2 with 5 seconds. Pt states not feeling neck pain afterwards.   Increased time secondary to interpretation  Improved exercise technique, movement at target joints, use of target muscles after min to mod verbal, visual, tactile cues.   Worked on reciprocal inhibition of R rhomboid muscle to decrease tension to lower cervical spine as well as lower trap muscle strengthening to decrease L upper trap  tension, and L scapular depression to decrease L scalene muscle tension in conjunction with manual therapy to decrease cervical paraspinal and R rhomboid/upper trap muscle tension. Decreased pain to 3/10 after session.     PT Long Term Goals - 01/02/18 1049      PT LONG TERM GOAL #1   Title  Pt will have a decrease in L cervical and L UE pain to 5/10 or less at worst to promote ability to use L UE to perform functional tasks.     Baseline  8/10 at worst. (10/25/2017), (12/12/2017)    Time  6    Period  Weeks    Status  On-going    Target Date  02/07/18      PT LONG TERM GOAL #2   Title  Pt will improve her L UE strength by at least 1/2 MMT grade to promote ability to perform functional tasks.     Baseline  Left: shoulder flexion 2+/5, elbow flexion 2+/5, wrist extension 2+/5 (10/25/2017); Not tested today secondary to pt stating pain everytime she lifts her L arm (12/12/2017)    Time  6    Period  Weeks    Status  Unable to assess    Target Date  02/07/18      PT LONG TERM GOAL #3   Title  Patient will improve her FOTO score by at least 10 points as a demonstration of improved function.     Baseline  4 (filled out with husband's help 10/25/2017); 32 (with interpreter's help, 12/12/2017)    Time  4    Period  Weeks    Status  Achieved      PT LONG TERM GOAL #4   Title  Patient will improve her Quick Dash score by at least 10% as a demonnstration of improved function.     Baseline  88.6% (filled out with husband's help 10/25/2017); 63.6% (with interpreter's help 12/12/2017)    Time  7    Period  Weeks    Status  Achieved      PT LONG TERM GOAL #5   Title  Patient will improve her FOTO score to at least 42 points as a demonstration of improved function.     Baseline  32 (12/12/2017)    Time  6    Period  Weeks    Status  New    Target Date  02/07/18      PT LONG TERM GOAL #6  Title  Patient will improve her Quick Dash score to least 53% or less as a demonnstration of improved function.      Baseline  63% (12/12/2017)    Time  6    Period  Weeks    Status  New    Target Date  02/07/18            Plan - 01/18/18 0902    Clinical Impression Statement  Worked on reciprocal inhibition of R rhomboid muscle to decrease tension to lower cervical spine as well as lower trap muscle strengthening to decrease L upper trap tension, and L scapular depression to decrease L scalene muscle tension in conjunction with manual therapy to decrease cervical paraspinal and R rhomboid/upper trap muscle tension. Decreased pain to 3/10 after session.     Rehab Potential  Fair    Clinical Impairments Affecting Rehab Potential  Chronicity of condition, multiple areas involved, language barrier, pain, weakness, difficulty using her L UE for tasks    PT Frequency  2x / week    PT Duration  6 weeks    PT Treatment/Interventions  Manual techniques;Patient/family education;Therapeutic activities;Therapeutic exercise;Passive range of motion;Dry needling;Neuromuscular re-education;Ultrasound;Electrical Stimulation;Iontophoresis /ml Dexamethasone;Aquatic Therapy traction if appropriate    PT Next Visit Plan  manual techniques, ROM, gentle strengthening, modalities as needed    Consulted and Agree with Plan of Care  Patient       Patient will benefit from skilled therapeutic intervention in order to improve the following deficits and impairments:  Pain, Postural dysfunction, Improper body mechanics, Impaired UE functional use, Decreased strength, Decreased range of motion  Visit Diagnosis: Cervicalgia  Radiculopathy, cervical region  Chronic left shoulder pain  Pain in left arm  Muscle weakness (generalized)  Pain in left elbow  Pain in left hand  Pain in left forearm     Problem List Patient Active Problem List   Diagnosis Date Noted  . Abnormal brain scan 02/15/2017  . Inflammation of shoulder joint 02/15/2017  . Neck pain 02/15/2017  . Hemochromatosis, hereditary (HCC)  01/18/2017  . Hypertriglyceridemia 11/13/2016  . Blurry vision 05/24/2016  . Chronic tension-type headache, not intractable 05/24/2016  . Acute headache 04/19/2016  . Left arm pain 04/19/2016  . Rotator cuff tear 03/08/2016  . Carpal tunnel syndrome 03/08/2016  . Cervical radiculitis 03/08/2016    Loralyn Freshwater PT, DPT   01/18/2018, 9:56 AM  [facility]YS[facility] AND SPORTS MEDICINE [address], [place], [address] Phone: [phone]   Fax:  [phone]  Name: Lashaun Poch MRN: [id number] Date of Birth: 1955-10-15

## 2018-01-18 NOTE — Patient Instructions (Signed)
Seated manually resisted L scapular retraction targeting the lower trap muscles 10x5 seconds for 3 sets. Reviewed with husband as well.,Husband and pt demonstrated and verbalized understanding.

## 2018-01-23 ENCOUNTER — Ambulatory Visit: Payer: Medicaid Other | Attending: Neurology

## 2018-01-23 DIAGNOSIS — M25522 Pain in left elbow: Secondary | ICD-10-CM | POA: Diagnosis present

## 2018-01-23 DIAGNOSIS — M25512 Pain in left shoulder: Secondary | ICD-10-CM | POA: Insufficient documentation

## 2018-01-23 DIAGNOSIS — M79632 Pain in left forearm: Secondary | ICD-10-CM | POA: Diagnosis present

## 2018-01-23 DIAGNOSIS — M5412 Radiculopathy, cervical region: Secondary | ICD-10-CM | POA: Insufficient documentation

## 2018-01-23 DIAGNOSIS — M542 Cervicalgia: Secondary | ICD-10-CM | POA: Insufficient documentation

## 2018-01-23 DIAGNOSIS — M79642 Pain in left hand: Secondary | ICD-10-CM | POA: Insufficient documentation

## 2018-01-23 DIAGNOSIS — G8929 Other chronic pain: Secondary | ICD-10-CM | POA: Diagnosis present

## 2018-01-23 DIAGNOSIS — M79602 Pain in left arm: Secondary | ICD-10-CM | POA: Insufficient documentation

## 2018-01-23 DIAGNOSIS — M6281 Muscle weakness (generalized): Secondary | ICD-10-CM | POA: Diagnosis present

## 2018-01-23 NOTE — Therapy (Signed)
[facility] [address], [place], [address] Phone: [phone]   Fax:  [phone]  Physical Therapy Treatment  Patient Details  Name: Tammy Lam MRN: [id number] Date of Birth: [birth year] Referring Provider: Theora Master, MD    Encounter Date: 01/23/2018  PT End of Session - 01/23/18 0904    Visit Number  9    Number of Visits  19    Date for PT Re-Evaluation  02/07/18    Authorization Type  5 of 12    Authorization Time Period  Medicaid    PT Start Time  0904    PT Stop Time  0948    PT Time Calculation (min)  44 min    Activity Tolerance  Patient limited by pain    Behavior During Therapy  [facility] for tasks assessed/performed       Past Medical History:  Diagnosis Date  . Elevated ferritin   . Headache   . Hemochromatosis   . Hyperlipidemia   . Hyponatremia   . Medical history non-contributory   . Shoulder injury     Past Surgical History:  Procedure Laterality Date  . ANKLE SURGERY Right     There were no vitals filed for this visit.  Subjective Assessment - 01/23/18 0906    Subjective  Pt eye is wide open... better. 3.5/10 currently. The number is good for her. Can use her L UE a little bit but not much. Can raise her L arm up and down.  Pt states that her doctor decreased her pain medication. She is feeling better.  Used to take 4 pain pills a day. She now only takes 1 a day. She is happy.     Pertinent History  L arm and shoulder pain: 16 years ago when pt was at work, pt had to lift heavy items. Pt had electric pad treatment. Also went to PT and for some reason, she could not do exercises.   Pt could not move her arm.  2 years ago pt started taking medication.  1 month ago pt started taking injections.  Medication does help however the injection helps with the pain but also has pain in her elbow to her hand and the back of her neck.  Feels pain from elbow down her whole arm and hand. Feels a lot  of pain on the palm of her L hand (pt observed to rub her hand a lot along the L C7/C8 dermatome on her hand).  Denies loss of bowel or bladder control. When she moves her L hand, her pain also affects her head down to her feet on the L side.  Doctor just gave her injections, no mention of surgery.  Also has pain in the L side of her tongue, eye, face.  Pt states that the neurologist knows about it.  Does not think she can exercise her L UE due to pain.  Last PT was 15-16 years ago.   Pt is L hand dominant.     Patient Stated Goals  Pt expresses desire to get better.     Currently in Pain?  Yes    Pain Score  4  3.5/10    Pain Onset  More than a month ago                               PT Education - 01/23/18 0927    Education  provided  Yes    Education Details  ther-ex    Person(s) Educated  Patient    Methods  Explanation;Demonstration;Tactile cues;Verbal cues    Comprehension  Returned demonstration;Verbalized understanding         Objectives  Interpreter Selena Batten(Kim) present  Manual therapy   Seated STM to bilateral cervical paraspinal muscles.   Seated STM to Rrhomboid/upper trap muscle.    Therapeutic exercise  Seated gentle manually resisted R scapular protraction isometrics, manually resisted by PT 10x5 seconds for 3 sets to promote reciprocal inhibition of R rhomboid muscle to decrease tension to lower cervical spine.  Seated manually resisted L scapular retraction targeting the lower trap muscles 10x5 seconds for 3 sets.    Seated L scapular depression isometrics, gentle resistance from PT 10x3 with 5 seconds.     Seated R scapular retraction with PT guidance 10x3    Increased time secondary to interpretation  Improved exercise technique, movement at target joints, use of target muscles after min to mod verbal, visual, tactile cues.    Pt states feeling better after session. Continued working on decreasing R rhomboid and  posterior cervical paraspinal tension with manual techniques and exercises as well as decreasing L scalene muscle tension through reciprocal inhibition with L scapular depression exercise.     PT Long Term Goals - 01/02/18 1049      PT LONG TERM GOAL #1   Title  Pt will have a decrease in L cervical and L UE pain to 5/10 or less at worst to promote ability to use L UE to perform functional tasks.     Baseline  8/10 at worst. (10/25/2017), (12/12/2017)    Time  6    Period  Weeks    Status  On-going    Target Date  02/07/18      PT LONG TERM GOAL #2   Title  Pt will improve her L UE strength by at least 1/2 MMT grade to promote ability to perform functional tasks.     Baseline  Left: shoulder flexion 2+/5, elbow flexion 2+/5, wrist extension 2+/5 (10/25/2017); Not tested today secondary to pt stating pain everytime she lifts her L arm (12/12/2017)    Time  6    Period  Weeks    Status  Unable to assess    Target Date  02/07/18      PT LONG TERM GOAL #3   Title  Patient will improve her FOTO score by at least 10 points as a demonstration of improved function.     Baseline  4 (filled out with husband's help 10/25/2017); 32 (with interpreter's help, 12/12/2017)    Time  4    Period  Weeks    Status  Achieved      PT LONG TERM GOAL #4   Title  Patient will improve her Quick Dash score by at least 10% as a demonnstration of improved function.     Baseline  88.6% (filled out with husband's help 10/25/2017); 63.6% (with interpreter's help 12/12/2017)    Time  7    Period  Weeks    Status  Achieved      PT LONG TERM GOAL #5   Title  Patient will improve her FOTO score to at least 42 points as a demonstration of improved function.     Baseline  32 (12/12/2017)    Time  6    Period  Weeks    Status  New    Target Date  02/07/18  PT LONG TERM GOAL #6   Title  Patient will improve her Quick Dash score to least 53% or less as a demonnstration of improved function.     Baseline  63% (12/12/2017)     Time  6    Period  Weeks    Status  New    Target Date  02/07/18            Plan - 01/23/18 0929    Clinical Impression Statement  Pt states feeling better after session. Continued working on decreasing R rhomboid and posterior cervical paraspinal tension with manual techniques and exercises as well as decreasing L scalene muscle tension through reciprocal inhibition with L scapular depression exercise.     Rehab Potential  Fair    Clinical Impairments Affecting Rehab Potential  Chronicity of condition, multiple areas involved, language barrier, pain, weakness, difficulty using her L UE for tasks    PT Frequency  2x / week    PT Duration  6 weeks    PT Treatment/Interventions  Manual techniques;Patient/family education;Therapeutic activities;Therapeutic exercise;Passive range of motion;Dry needling;Neuromuscular re-education;Ultrasound;Electrical Stimulation;Iontophoresis 4mg /ml Dexamethasone;Aquatic Therapy traction if appropriate    PT Next Visit Plan  manual techniques, ROM, gentle strengthening, modalities as needed    Consulted and Agree with Plan of Care  Patient       Patient will benefit from skilled therapeutic intervention in order to improve the following deficits and impairments:  Pain, Postural dysfunction, Improper body mechanics, Impaired UE functional use, Decreased strength, Decreased range of motion  Visit Diagnosis: Radiculopathy, cervical region  Cervicalgia  Chronic left shoulder pain  Pain in left arm  Muscle weakness (generalized)  Pain in left elbow  Pain in left hand  Pain in left forearm     Problem List Patient Active Problem List   Diagnosis Date Noted  . Abnormal brain scan 02/15/2017  . Inflammation of shoulder joint 02/15/2017  . Neck pain 02/15/2017  . Hemochromatosis, hereditary (HCC) 01/18/2017  . Hypertriglyceridemia 11/13/2016  . Blurry vision 05/24/2016  . Chronic tension-type headache, not intractable 05/24/2016  .  Acute headache 04/19/2016  . Left arm pain 04/19/2016  . Rotator cuff tear 03/08/2016  . Carpal tunnel syndrome 03/08/2016  . Cervical radiculitis 03/08/2016    Loralyn Freshwater PT, DPT   01/23/2018, 11:56 AM  [facility] PHYSICAL AND SPORTS MEDICINE [address] [place], [address] Phone: [phone]   Fax:  [phone]  Name: Tammy Lam MRN: [id number] Date of Birth: 10-08-1955

## 2018-01-25 ENCOUNTER — Ambulatory Visit: Payer: Medicaid Other

## 2018-01-25 DIAGNOSIS — M79642 Pain in left hand: Secondary | ICD-10-CM

## 2018-01-25 DIAGNOSIS — M542 Cervicalgia: Secondary | ICD-10-CM

## 2018-01-25 DIAGNOSIS — M5412 Radiculopathy, cervical region: Secondary | ICD-10-CM | POA: Diagnosis not present

## 2018-01-25 DIAGNOSIS — M25522 Pain in left elbow: Secondary | ICD-10-CM

## 2018-01-25 DIAGNOSIS — M25512 Pain in left shoulder: Secondary | ICD-10-CM

## 2018-01-25 DIAGNOSIS — M6281 Muscle weakness (generalized): Secondary | ICD-10-CM

## 2018-01-25 DIAGNOSIS — M79632 Pain in left forearm: Secondary | ICD-10-CM

## 2018-01-25 DIAGNOSIS — G8929 Other chronic pain: Secondary | ICD-10-CM

## 2018-01-25 DIAGNOSIS — M79602 Pain in left arm: Secondary | ICD-10-CM

## 2018-01-25 NOTE — Therapy (Signed)
[facility] PHYSICAL AND SPORTS MEDICINE [address] [place], [place], [address] Phone: [phone]   Fax:  [phone]  Physical Therapy Treatment  Patient Details  Name: Tammy Lam Casino MRN: [id number] Date of Birth: 10/15/1955 Referring Provider: Theora MasterZachary Potter, MD   Encounter Date: 01/25/2018  PT End of Session - 01/25/18 0954    Visit Number  10    Number of Visits  19    Date for PT Re-Evaluation  02/07/18    Authorization Type  6 of 12    Authorization Time Period  Medicaid    PT Start Time  [phone]    PT Stop Time  1034    PT Time Calculation (min)  40 min    Activity Tolerance  Patient limited by pain    Behavior During Therapy  [facility] tasks assessed/performed       Past Medical History:  Diagnosis Date  . Elevated ferritin   . Headache   . Hemochromatosis   . Hyperlipidemia   . Hyponatremia   . Medical history non-contributory   . Shoulder injury     Past Surgical History:  Procedure Laterality Date  . ANKLE SURGERY Right     There were no vitals filed for this visit.  Subjective Assessment - 01/25/18 0956    Subjective  Pt states she feels good. Can keep her eyes open and move her L arm. Can smile now. 2.5/10 pain currently.     Pertinent History  L arm and shoulder pain: 16 years ago when pt was at work, pt had to lift heavy items. Pt had electric pad treatment. Also went to PT and for some reason, she could not do exercises.   Pt could not move her arm.  2 years ago pt started taking medication.  1 month ago pt started taking injections.  Medication does help however the injection helps with the pain but also has pain in her elbow to her hand and the back of her neck.  Feels pain from elbow down her whole arm and hand. Feels a lot of pain on the palm of her L hand (pt observed to rub her hand a lot along the L C7/C8 dermatome on her hand).  Denies loss of bowel or bladder control. When she moves her L hand, her pain also affects  her head down to her feet on the L side.  Doctor just gave her injections, no mention of surgery.  Also has pain in the L side of her tongue, eye, face.  Pt states that the neurologist knows about it.  Does not think she can exercise her L UE due to pain.  Last PT was 15-16 years ago.   Pt is L hand dominant.     Patient Stated Goals  Pt expresses desire to get better.     Currently in Pain?  Yes    Pain Score  3  2.5/10     Pain Onset  More than a month ago                               PT Education - 01/25/18 1025    Education provided  Yes    Education Details  ther-ex    Starwood HotelsPerson(s) Educated  Patient    Methods  Explanation;Tactile cues;Demonstration;Verbal cues    Comprehension  Returned demonstration;Verbalized understanding        Objectives  Interpreter Selena Batten(Kim) present  Manual therapy   Seated STM to bilateral cervical paraspinal muscles.   Seated STM to Rrhomboid/upper trap muscle.  Seated STM to distal L scalene and first rib area  Seated STM to L teres major muscle area      Therapeutic exercise   Seated L scapular depression isometrics, gentle resistance from PT 10x3 with 5 seconds.   Seated gentle manually resisted R scapular protraction isometrics, manually resisted by PT 10x5 seconds for 3 sets to promote reciprocal inhibition of R rhomboid muscle to decrease tension to lower cervical spine.    Seated R scapular retraction with PT guidance 10x   Improved exercise technique, movement at target joints, use of target muscles after min to mod verbal, visual, tactile cues.   Pt demonstrates progress of decreased L cervical and UE pain from previous session with starting pain level at 2.5/10 today. Continued working on decreasing R rhomboid/upper trap, bilateral cervical paraspinal, and L scalene muscle tension as well as promoting gentle scapular retraction. Pt tolerated session well without aggravation of symptoms.      PT Long Term Goals - 01/02/18 1049      PT LONG TERM GOAL #1   Title  Pt will have a decrease in L cervical and L UE pain to 5/10 or less at worst to promote ability to use L UE to perform functional tasks.     Baseline  8/10 at worst. (10/25/2017), (12/12/2017)    Time  6    Period  Weeks    Status  On-going    Target Date  02/07/18      PT LONG TERM GOAL #2   Title  Pt will improve her L UE strength by at least 1/2 MMT grade to promote ability to perform functional tasks.     Baseline  Left: shoulder flexion 2+/5, elbow flexion 2+/5, wrist extension 2+/5 (10/25/2017); Not tested today secondary to pt stating pain everytime she lifts her L arm (12/12/2017)    Time  6    Period  Weeks    Status  Unable to assess    Target Date  02/07/18      PT LONG TERM GOAL #3   Title  Patient will improve her FOTO score by at least 10 points as a demonstration of improved function.     Baseline  4 (filled out with husband's help 10/25/2017); 32 (with interpreter's help, 12/12/2017)    Time  4    Period  Weeks    Status  Achieved      PT LONG TERM GOAL #4   Title  Patient will improve her Quick Dash score by at least 10% as a demonnstration of improved function.     Baseline  88.6% (filled out with husband's help 10/25/2017); 63.6% (with interpreter's help 12/12/2017)    Time  7    Period  Weeks    Status  Achieved      PT LONG TERM GOAL #5   Title  Patient will improve her FOTO score to at least 42 points as a demonstration of improved function.     Baseline  32 (12/12/2017)    Time  6    Period  Weeks    Status  New    Target Date  02/07/18      PT LONG TERM GOAL #6   Title  Patient will improve her Quick Dash score to least 53% or less as a demonnstration of improved function.     Baseline  63% (  12/12/2017)    Time  6    Period  Weeks    Status  New    Target Date  02/07/18            Plan - 01/25/18 1026    Clinical Impression Statement  Pt demonstrates progress of decreased L  cervical and UE pain from previous session with starting pain level at 2.5/10 today. Continued working on decreasing R rhomboid/upper trap, bilateral cervical paraspinal, and L scalene muscle tension as well as promoting gentle scapular retraction. Pt tolerated session well without aggravation of symptoms.     Rehab Potential  Fair    Clinical Impairments Affecting Rehab Potential  Chronicity of condition, multiple areas involved, language barrier, pain, weakness, difficulty using her L UE for tasks    PT Frequency  2x / week    PT Duration  6 weeks    PT Treatment/Interventions  Manual techniques;Patient/family education;Therapeutic activities;Therapeutic exercise;Passive range of motion;Dry needling;Neuromuscular re-education;Ultrasound;Electrical Stimulation;Iontophoresis 4mg /ml Dexamethasone;Aquatic Therapy traction if appropriate    PT Next Visit Plan  manual techniques, ROM, gentle strengthening, modalities as needed    Consulted and Agree with Plan of Care  Patient       Patient will benefit from skilled therapeutic intervention in order to improve the following deficits and impairments:  Pain, Postural dysfunction, Improper body mechanics, Impaired UE functional use, Decreased strength, Decreased range of motion  Visit Diagnosis: Radiculopathy, cervical region  Cervicalgia  Chronic left shoulder pain  Pain in left arm  Muscle weakness (generalized)  Pain in left elbow  Pain in left forearm  Pain in left hand     Problem List Patient Active Problem List   Diagnosis Date Noted  . Abnormal brain scan 02/15/2017  . Inflammation of shoulder joint 02/15/2017  . Neck pain 02/15/2017  . Hemochromatosis, hereditary (HCC) 01/18/2017  . Hypertriglyceridemia 11/13/2016  . Blurry vision 05/24/2016  . Chronic tension-type headache, not intractable 05/24/2016  . Acute headache 04/19/2016  . Left arm pain 04/19/2016  . Rotator cuff tear 03/08/2016  . Carpal tunnel syndrome  03/08/2016  . Cervical radiculitis 03/08/2016    Loralyn Freshwater PT, DPT   01/25/2018, 1:26 PM  [place] [facility] PHYSICAL AND SPORTS MEDICINE [address] [place], [address] Phone: [phone]   Fax:  [phone]  Name: Odaliz Mcqueary MRN: [id number] Date of Birth: 1956-07-09

## 2018-01-31 ENCOUNTER — Ambulatory Visit: Payer: Medicaid Other

## 2018-01-31 DIAGNOSIS — M79642 Pain in left hand: Secondary | ICD-10-CM

## 2018-01-31 DIAGNOSIS — M79632 Pain in left forearm: Secondary | ICD-10-CM

## 2018-01-31 DIAGNOSIS — M25512 Pain in left shoulder: Secondary | ICD-10-CM

## 2018-01-31 DIAGNOSIS — M6281 Muscle weakness (generalized): Secondary | ICD-10-CM

## 2018-01-31 DIAGNOSIS — M5412 Radiculopathy, cervical region: Secondary | ICD-10-CM

## 2018-01-31 DIAGNOSIS — M25522 Pain in left elbow: Secondary | ICD-10-CM

## 2018-01-31 DIAGNOSIS — M542 Cervicalgia: Secondary | ICD-10-CM

## 2018-01-31 DIAGNOSIS — M79602 Pain in left arm: Secondary | ICD-10-CM

## 2018-01-31 DIAGNOSIS — G8929 Other chronic pain: Secondary | ICD-10-CM

## 2018-01-31 NOTE — Therapy (Signed)
[place] PHYSICAL AND SPORTS MEDICINE [address] [place], [address] Phone: [phone]   Fax:  [phone]  Physical Therapy Treatment And Progress Report  Patient Details  Name: Tammy Lam MRN: [id number] Date of Birth: 1956/05/26 Referring Provider: Gurney Maxin, MD   Encounter Date: 01/31/2018  PT End of Session - 01/31/18 1036    Visit Number  11    Number of Visits  31    Date for PT Re-Evaluation  04/06/18    Authorization Type  7 of 12    Authorization Time Period  Medicaid    PT Start Time  1038    PT Stop Time  1118    PT Time Calculation (min)  40 min    Activity Tolerance  Patient limited by pain    Behavior During Therapy  [facility] for tasks assessed/performed       Past Medical History:  Diagnosis Date  . Elevated ferritin   . Headache   . Hemochromatosis   . Hyperlipidemia   . Hyponatremia   . Medical history non-contributory   . Shoulder injury     Past Surgical History:  Procedure Laterality Date  . ANKLE SURGERY Right     There were no vitals filed for this visit.  Subjective Assessment - 01/31/18 1039    Subjective  She feels better. Feels a little bit of headache when she turns her head to look around and to the sky. 3/10 currently (4/10 neck and L UE pain at worst for the past 7 days).  Leaving for [facility] 02/01/2018 to 02/23/2018.    Pertinent History  L arm and shoulder pain: 16 years ago when pt was at work, pt had to lift heavy items. Pt had electric pad treatment. Also went to PT and for some reason, she could not do exercises.   Pt could not move her arm.  2 years ago pt started taking medication.  1 month ago pt started taking injections.  Medication does help however the injection helps with the pain but also has pain in her elbow to her hand and the back of her neck.  Feels pain from elbow down her whole arm and hand. Feels a lot of pain on the palm of her L hand (pt observed to rub her hand a  lot along the L C7/C8 dermatome on her hand).  Denies loss of bowel or bladder control. When she moves her L hand, her pain also affects her head down to her feet on the L side.  Doctor just gave her injections, no mention of surgery.  Also has pain in the L side of her tongue, eye, face.  Pt states that the neurologist knows about it.  Does not think she can exercise her L UE due to pain.  Last PT was 15-16 years ago.   Pt is L hand dominant.     Patient Stated Goals  Pt expresses desire to get better.     Currently in Pain?  Yes    Pain Score  3     Pain Onset  More than a month ago                               PT Education - 01/31/18 1858    Education provided  Yes    Education Details  plan of care, ther-ex    Person(s) Educated  Patient  Methods  Explanation;Demonstration;Tactile cues;Verbal cues    Comprehension  Returned demonstration;Verbalized understanding          Objectives  Interpreter Maudie Mercury) present  4/10 neck and L UE pain at worst for the past 7 days    Leaving for [facility] 02/01/18 to 02/23/2018   Manual therapy   Seated STM to bilateral cervical paraspinal muscles.   Seated STM to Rrhomboid/upper trap muscle.  Seated STM to distal L scalene and first rib area  Seated STM to L teres major muscle area   Seated A to P to L shoulder grade 3 to 3+ to help decrease L anterior shoulder pain with flexion   Improved L shoulder flexin AROM to a little past 90 degrees    Therapeutic exercise  Reviewed plan of care: 2x/week for 6 weeks starting 02/24/2018 when she returns from trip (about 9 weeks from now)   L shoulder flexion 3-/5 with anterior shoulder pain, elbow flexion 3-/5, wrist extension 3-/5 AROM  Reviewed progress/current status with L UE strength with pt.   Seated scapular retraction L 5x  Increased time secondary to interpretation   Improved exercise technique, movement at target joints, use of target  muscles after min to mod verbal, visual, tactile cues.   Pt demonstrates overall decreased neck and L UE pain, improved ability to raise her L arm, slight improved L UE strength and ability to perform functional tasks since initial evaluation. Symptoms seem to improve with treatment to decrease muscle tension to cervical paraspinal, R rhomboid/upper trap, L distal scalene, L teres major muscle area as well as improving muscle activation of her L lower traps. Pt still demonstrates pain, L UE weakness and difficulty raising her L arm and using it to perform tasks at home and would benefit from continued skilled physical therapy services to address the aforementioned deficits.           PT Long Term Goals - 01/31/18 1910      PT LONG TERM GOAL #1   Title  Pt will have a decrease in L cervical and L UE pain to 5/10 or less at worst to promote ability to use L UE to perform functional tasks.     Baseline  8/10 at worst. (10/25/2017), (12/12/2017); 4/10 at worst for the past 7 days (01/31/2018)    Time  9    Period  Weeks    Status  Partially Met    Target Date  04/06/18      PT LONG TERM GOAL #2   Title  Pt will improve her L UE strength by at least 1/2 MMT grade to promote ability to perform functional tasks.     Baseline  Left: shoulder flexion 2+/5, elbow flexion 2+/5, wrist extension 2+/5 (10/25/2017); Not tested today secondary to pt stating pain everytime she lifts her L arm (12/12/2017); L shoulder flexion 3-/5 with anterior shoulder pain, elbow flexion 3-/5, wrist extension 3-/5 (01/31/2018)    Time  6    Period  Weeks    Status  Achieved      PT LONG TERM GOAL #3   Title  Patient will improve her FOTO score by at least 10 points as a demonstration of improved function.     Baseline  4 (filled out with husband's help 10/25/2017); 32 (with interpreter's help, 12/12/2017); 20 (with interpreter's help, 01/31/2018)    Time  4    Period  Weeks    Status  Achieved    Target Date  01/19/18  PT LONG TERM GOAL #4   Title  Patient will improve her Quick Dash score by at least 10% as a demonnstration of improved function.     Baseline  88.6% (filled out with husband's help 10/25/2017); 63.6% (with interpreter's help 12/12/2017); 79.5% (with interpreter's help, 01/31/2018)    Time  9    Period  Weeks    Status  On-going    Target Date  04/06/18      PT LONG TERM GOAL #5   Title  Patient will improve her FOTO score to at least 42 points as a demonstration of improved function.     Baseline  32 (12/12/2017); 20 (with interpreter's help, 01/31/2018)    Time  9    Period  Weeks    Status  On-going    Target Date  04/06/18      Additional Long Term Goals   Additional Long Term Goals  Yes      PT LONG TERM GOAL #6   Title  Patient will improve her Quick Dash score to least 53% or less as a demonnstration of improved function.     Baseline  63% (12/12/2017); 79.5% (with interpreter's help, 01/31/2018)    Time  9    Period  Weeks    Status  On-going    Target Date  04/06/18      PT LONG TERM GOAL #7   Title  Patient will improve L UE strength to 3/5 or more MMT grade for L UE to promote ability to perform functional tasks.     Baseline  L shoulder flexion 3-/5 with anterior shoulder pain, elbow flexion 3-/5, wrist extension 3-/5 (01/31/2018)    Time  9    Period  Weeks    Status  New    Target Date  04/04/18            Plan - 01/31/18 1859    Clinical Impression Statement  Pt demonstrates overall decreased neck and L UE pain, improved ability to raise her L arm, slight improved L UE strength and ability to perform functional tasks since initial evaluation. Symptoms seem to improve with treatment to decrease muscle tension to cervical paraspinal, R rhomboid/upper trap, L distal scalene, L teres major muscle area as well as improving muscle activation of her L lower traps. Pt still demonstrates pain, L UE weakness and difficulty raising her L arm and using it to perform tasks at  home and would benefit from continued skilled physical therapy services to address the aforementioned deficits.     History and Personal Factors relevant to plan of care:  Chronicity of condition, multiple areas involved, language barrier, pain, weakness, difficulty using her L UE for tasks    Clinical Presentation  Stable    Clinical Presentation due to:  decreasing pain, improving ability to raise her L arm up    Clinical Decision Making  Low    Rehab Potential  Fair    Clinical Impairments Affecting Rehab Potential  Chronicity of condition, multiple areas involved, language barrier, pain, weakness, difficulty using her L UE for tasks    PT Frequency  2x / week    PT Duration  Other (comment) 9 weeks (6 weeks of PT after pt returns from trip)    PT Treatment/Interventions  Manual techniques;Patient/family education;Therapeutic activities;Therapeutic exercise;Passive range of motion;Dry needling;Neuromuscular re-education;Ultrasound;Electrical Stimulation;Iontophoresis 105m/ml Dexamethasone;Aquatic Therapy traction if appropriate    PT Next Visit Plan  manual techniques, ROM, gentle strengthening, modalities as needed  Consulted and Agree with Plan of Care  Patient       Patient will benefit from skilled therapeutic intervention in order to improve the following deficits and impairments:  Pain, Postural dysfunction, Improper body mechanics, Impaired UE functional use, Decreased strength, Decreased range of motion  Visit Diagnosis: Radiculopathy, cervical region - Plan: PT plan of care cert/re-cert  Cervicalgia - Plan: PT plan of care cert/re-cert  Chronic left shoulder pain - Plan: PT plan of care cert/re-cert  Pain in left arm - Plan: PT plan of care cert/re-cert  Muscle weakness (generalized) - Plan: PT plan of care cert/re-cert  Pain in left elbow - Plan: PT plan of care cert/re-cert  Pain in left forearm - Plan: PT plan of care cert/re-cert  Pain in left hand - Plan: PT plan of  care cert/re-cert     Problem List Patient Active Problem List   Diagnosis Date Noted  . Abnormal brain scan 02/15/2017  . Inflammation of shoulder joint 02/15/2017  . Neck pain 02/15/2017  . Hemochromatosis, hereditary ([place]) 01/18/2017  . Hypertriglyceridemia 11/13/2016  . Blurry vision 05/24/2016  . Chronic tension-type headache, not intractable 05/24/2016  . Acute headache 04/19/2016  . Left arm pain 04/19/2016  . Rotator cuff tear 03/08/2016  . Carpal tunnel syndrome 03/08/2016  . Cervical radiculitis 03/08/2016   Thank you for your referral.  Joneen Boers PT, DPT   01/31/2018, 7:26 PM  [place] PHYSICAL AND SPORTS MEDICINE [address] [place], [address] Phone: [phone]   Fax:  [phone]  Name: Tammy Lam MRN: [id number] Date of Birth: 1956/03/10

## 2018-03-02 ENCOUNTER — Ambulatory Visit: Payer: Medicaid Other | Attending: Neurology

## 2018-03-02 DIAGNOSIS — M6281 Muscle weakness (generalized): Secondary | ICD-10-CM | POA: Diagnosis present

## 2018-03-02 DIAGNOSIS — M542 Cervicalgia: Secondary | ICD-10-CM | POA: Insufficient documentation

## 2018-03-02 DIAGNOSIS — M79602 Pain in left arm: Secondary | ICD-10-CM | POA: Insufficient documentation

## 2018-03-02 DIAGNOSIS — M79642 Pain in left hand: Secondary | ICD-10-CM | POA: Diagnosis present

## 2018-03-02 DIAGNOSIS — M5412 Radiculopathy, cervical region: Secondary | ICD-10-CM | POA: Insufficient documentation

## 2018-03-02 DIAGNOSIS — M79632 Pain in left forearm: Secondary | ICD-10-CM | POA: Diagnosis present

## 2018-03-02 DIAGNOSIS — M25512 Pain in left shoulder: Secondary | ICD-10-CM | POA: Diagnosis present

## 2018-03-02 DIAGNOSIS — G8929 Other chronic pain: Secondary | ICD-10-CM | POA: Diagnosis present

## 2018-03-02 DIAGNOSIS — M25522 Pain in left elbow: Secondary | ICD-10-CM | POA: Diagnosis present

## 2018-03-02 DIAGNOSIS — R29898 Other symptoms and signs involving the musculoskeletal system: Secondary | ICD-10-CM | POA: Insufficient documentation

## 2018-03-02 NOTE — Therapy (Signed)
[facility] [address], [place], [address] Phone: [phone]   Fax:  [phone]  Physical Therapy Treatment  Patient Details  Name: Tammy Lam MRN: [id number] Date of Birth: 1956-08-23 Referring Provider: Gurney Maxin, MD   Encounter Date: 03/02/2018  PT End of Session - 03/02/18 0900    Visit Number  12    Number of Visits  31    Date for PT Re-Evaluation  04/06/18    Authorization Type  1 of 12    Authorization Time Period  Medicaid    PT Start Time  0903    PT Stop Time  0948    PT Time Calculation (min)  45 min    Activity Tolerance  Patient limited by pain    Behavior During Therapy  [facility] for tasks assessed/performed       Past Medical History:  Diagnosis Date  . Elevated ferritin   . Headache   . Hemochromatosis   . Hyperlipidemia   . Hyponatremia   . Medical history non-contributory   . Shoulder injury     Past Surgical History:  Procedure Laterality Date  . ANKLE SURGERY Right     There were no vitals filed for this visit.  Subjective Assessment - 03/02/18 0908    Subjective  Pt states doctor added Voltaren gel.  Head and neck, and back and arm hurts. No problems during the trip but when she moves around, it hurts.  6/10 currently.     Pertinent History  L arm and shoulder pain: 16 years ago when pt was at work, pt had to lift heavy items. Pt had electric pad treatment. Also went to PT and for some reason, she could not do exercises.   Pt could not move her arm.  2 years ago pt started taking medication.  1 month ago pt started taking injections.  Medication does help however the injection helps with the pain but also has pain in her elbow to her hand and the back of her neck.  Feels pain from elbow down her whole arm and hand. Feels a lot of pain on the palm of her L hand (pt observed to rub her hand a lot along the L C7/C8 dermatome on her hand).  Denies loss of bowel or bladder  control. When she moves her L hand, her pain also affects her head down to her feet on the L side.  Doctor just gave her injections, no mention of surgery.  Also has pain in the L side of her tongue, eye, face.  Pt states that the neurologist knows about it.  Does not think she can exercise her L UE due to pain.  Last PT was 15-16 years ago.   Pt is L hand dominant.     Patient Stated Goals  Pt expresses desire to get better.     Currently in Pain?  Yes    Pain Score  6     Pain Onset  More than a month ago                               PT Education - 03/02/18 1108    Education provided  Yes    Education Details  ther-ex    [employer]) Educated  Patient    Methods  Explanation;Demonstration;Tactile cues;Verbal cues    Comprehension  Returned demonstration;Verbalized understanding  Occupational hygienist [employer]) [phone]  Pt verbal permission to place phone on speaker    Manual therapy   Seated STM to bilateral cervical paraspinal muscles.   Seated STM to Rrhomboid/upper trap muscle.  Seated STM to distal L scalene and first rib area  Seated STM to L teres major and infraspinatus muscle area  Decreased pain to 5/10 afterwards     Therapeutic exercise   Seated L scapular depression isometrics, gentle resistance from PT 10x2with 5 seconds.   Seated gentle manually resisted R scapular protraction isometrics, manually resisted by PT 10x5 seconds to promote reciprocal inhibition of R rhomboid muscle to decrease tension to lower cervical spine.  Increased time secondary to interpretation.    Improved exercise technique, movement at target joints, use of target muscles after min to mod verbal, visual, tactile cues.   Pt states that she feels like she can open her eyes and that her head feels a lot better after session. 5/10 everything afterwards. Continued working on decreasing posterior cervical, rhomboid,  infraspinatus, teres major and scalene muscle tension.      PT Long Term Goals - 01/31/18 1910      PT LONG TERM GOAL #1   Title  Pt will have a decrease in L cervical and L UE pain to 5/10 or less at worst to promote ability to use L UE to perform functional tasks.     Baseline  8/10 at worst. (10/25/2017), (12/12/2017); 4/10 at worst for the past 7 days (01/31/2018)    Time  9    Period  Weeks    Status  Partially Met    Target Date  04/06/18      PT LONG TERM GOAL #2   Title  Pt will improve her L UE strength by at least 1/2 MMT grade to promote ability to perform functional tasks.     Baseline  Left: shoulder flexion 2+/5, elbow flexion 2+/5, wrist extension 2+/5 (10/25/2017); Not tested today secondary to pt stating pain everytime she lifts her L arm (12/12/2017); L shoulder flexion 3-/5 with anterior shoulder pain, elbow flexion 3-/5, wrist extension 3-/5 (01/31/2018)    Time  6    Period  Weeks    Status  Achieved      PT LONG TERM GOAL #3   Title  Patient will improve her FOTO score by at least 10 points as a demonstration of improved function.     Baseline  4 (filled out with husband's help 10/25/2017); 32 (with interpreter's help, 12/12/2017); 20 (with interpreter's help, 01/31/2018)    Time  4    Period  Weeks    Status  Achieved    Target Date  01/19/18      PT LONG TERM GOAL #4   Title  Patient will improve her Quick Dash score by at least 10% as a demonnstration of improved function.     Baseline  88.6% (filled out with husband's help 10/25/2017); 63.6% (with interpreter's help 12/12/2017); 79.5% (with interpreter's help, 01/31/2018)    Time  9    Period  Weeks    Status  On-going    Target Date  04/06/18      PT LONG TERM GOAL #5   Title  Patient will improve her FOTO score to at least 42 points as a demonstration of improved function.     Baseline  32 (12/12/2017); 20 (with interpreter's help, 01/31/2018)    Time  9    Period  Weeks  Status  On-going    Target Date   04/06/18      Additional Long Term Goals   Additional Long Term Goals  Yes      PT LONG TERM GOAL #6   Title  Patient will improve her Quick Dash score to least 53% or less as a demonnstration of improved function.     Baseline  63% (12/12/2017); 79.5% (with interpreter's help, 01/31/2018)    Time  9    Period  Weeks    Status  On-going    Target Date  04/06/18      PT LONG TERM GOAL #7   Title  Patient will improve L UE strength to 3/5 or more MMT grade for L UE to promote ability to perform functional tasks.     Baseline  L shoulder flexion 3-/5 with anterior shoulder pain, elbow flexion 3-/5, wrist extension 3-/5 (01/31/2018)    Time  9    Period  Weeks    Status  New    Target Date  04/04/18            Plan - 03/02/18 0859    Clinical Impression Statement  Pt states that she feels like she can open her eyes and that her head feels a lot better after session. 5/10 everything afterwards. Continued working on decreasing posterior cervical, rhomboid, infraspinatus, teres major and scalene muscle tension.     Rehab Potential  Fair    Clinical Impairments Affecting Rehab Potential  Chronicity of condition, multiple areas involved, language barrier, pain, weakness, difficulty using her L UE for tasks    PT Frequency  2x / week    PT Duration  Other (comment) 9 weeks (6 weeks of PT after pt returns from trip)    PT Treatment/Interventions  Manual techniques;Patient/family education;Therapeutic activities;Therapeutic exercise;Passive range of motion;Dry needling;Neuromuscular re-education;Ultrasound;Electrical Stimulation;Iontophoresis 74m/ml Dexamethasone;Aquatic Therapy traction if appropriate    PT Next Visit Plan  manual techniques, ROM, gentle strengthening, modalities as needed    Consulted and Agree with Plan of Care  Patient       Patient will benefit from skilled therapeutic intervention in order to improve the following deficits and impairments:  Pain, Postural dysfunction,  Improper body mechanics, Impaired UE functional use, Decreased strength, Decreased range of motion  Visit Diagnosis: Radiculopathy, cervical region  Cervicalgia  Chronic left shoulder pain  Pain in left arm  Muscle weakness (generalized)  Pain in left elbow  Pain in left forearm  Pain in left hand     Problem List Patient Active Problem List   Diagnosis Date Noted  . Abnormal brain scan 02/15/2017  . Inflammation of shoulder joint 02/15/2017  . Neck pain 02/15/2017  . Hemochromatosis, hereditary ([place] 01/18/2017  . Hypertriglyceridemia 11/13/2016  . Blurry vision 05/24/2016  . Chronic tension-type headache, not intractable 05/24/2016  . Acute headache 04/19/2016  . Left arm pain 04/19/2016  . Rotator cuff tear 03/08/2016  . Carpal tunnel syndrome 03/08/2016  . Cervical radiculitis 03/08/2016    MJoneen BoersPT, DPT   03/02/2018, 11:16 AM  [facility] [address] [place] [address]Phone: [phone]  Fax:  [phone] Name: Tammy StandishMRN: [id number] of Birth: [birth year]

## 2018-03-06 ENCOUNTER — Ambulatory Visit: Payer: Medicaid Other

## 2018-03-06 DIAGNOSIS — M25522 Pain in left elbow: Secondary | ICD-10-CM

## 2018-03-06 DIAGNOSIS — M79602 Pain in left arm: Secondary | ICD-10-CM

## 2018-03-06 DIAGNOSIS — M25512 Pain in left shoulder: Secondary | ICD-10-CM

## 2018-03-06 DIAGNOSIS — M79642 Pain in left hand: Secondary | ICD-10-CM

## 2018-03-06 DIAGNOSIS — G8929 Other chronic pain: Secondary | ICD-10-CM

## 2018-03-06 DIAGNOSIS — M79632 Pain in left forearm: Secondary | ICD-10-CM

## 2018-03-06 DIAGNOSIS — M6281 Muscle weakness (generalized): Secondary | ICD-10-CM

## 2018-03-06 DIAGNOSIS — M5412 Radiculopathy, cervical region: Secondary | ICD-10-CM | POA: Diagnosis not present

## 2018-03-06 DIAGNOSIS — M542 Cervicalgia: Secondary | ICD-10-CM

## 2018-03-06 NOTE — Therapy (Signed)
[facility] [address] [place], [address] Phone: [phone]   Fax:  [phone]  Physical Therapy Treatment  Patient Details  Name: Tammy Lam MRN: [id number] Date of Birth: May 13, 1956 Referring Provider: Gurney Maxin, MD   Encounter Date: 03/06/2018  PT End of Session - 03/06/18 0950    Visit Number  13    Number of Visits  31    Date for PT Re-Evaluation  04/06/18    Authorization Type  2 of 12    Authorization Time Period  Medicaid    PT Start Time  0950    PT Stop Time  1041    PT Time Calculation (min)  51 min    Activity Tolerance  Patient limited by pain    Behavior During Therapy  [facility] for tasks assessed/performed       Past Medical History:  Diagnosis Date  . Elevated ferritin   . Headache   . Hemochromatosis   . Hyperlipidemia   . Hyponatremia   . Medical history non-contributory   . Shoulder injury     Past Surgical History:  Procedure Laterality Date  . ANKLE SURGERY Right     There were no vitals filed for this visit.  Subjective Assessment - 03/06/18 0952    Subjective  Using her gel to the area of pain helps. Did not use it this morning. 5/10 L head, lateral neck and shoulder pain currently.     Pertinent History  L arm and shoulder pain: 16 years ago when pt was at work, pt had to lift heavy items. Pt had electric pad treatment. Also went to PT and for some reason, she could not do exercises.   Pt could not move her arm.  2 years ago pt started taking medication.  1 month ago pt started taking injections.  Medication does help however the injection helps with the pain but also has pain in her elbow to her hand and the back of her neck.  Feels pain from elbow down her whole arm and hand. Feels a lot of pain on the palm of her L hand (pt observed to rub her hand a lot along the L C7/C8 dermatome on her hand).  Denies loss of bowel or bladder control. When she moves her L hand, her  pain also affects her head down to her feet on the L side.  Doctor just gave her injections, no mention of surgery.  Also has pain in the L side of her tongue, eye, face.  Pt states that the neurologist knows about it.  Does not think she can exercise her L UE due to pain.  Last PT was 15-16 years ago.   Pt is L hand dominant.     Patient Stated Goals  Pt expresses desire to get better.     Currently in Pain?  Yes    Pain Score  5     Pain Onset  More than a month ago                               PT Education - 03/06/18 1031    Education provided  Yes    Education Details  ther-ex    [employer]) Educated  Patient    Methods  Explanation;Demonstration;Tactile cues;Verbal cues    Comprehension  Returned demonstration;Verbalized understanding         Occupational hygienist (  [employer])    Manual therapy  gentle manual cervical compression 1x. L head and L lateral neck increase in symptoms  Seated very gentle manual cervical distraction 4x. Improved L UE symptoms. Increased posterior superior head symptoms, eases to baseline with rest.   Seated STM to bilateral cervical paraspinal muscles.   Seated STM to Rrhomboid/upper trap muscle.  Seated STM to distal L scalene and first rib area  Decreased L UE heaviness. Pt states it feels delightful.  Caudal pressure to L first rib area with L shoulder flexion 5x3     Therapeutic exercise  Seated L scapular depression isometrics, gentle resistance from PT 10x2with 10 seconds (Upgrade)  L scapular depression isometrics with L forearm on arm rest 5x5 seconds for 3 sets   Pt states exercise helping her L UE.   Give as part of HEP next visit if appropriate  Try seated R scap retraction targeting lower trap next session if appropriate  Increased time secondary to interpretation.   Improved exercise technique, movement at target joints, use of target muscles after min to mod verbal, visual,  tactile cues.   Decreased L UE, neck and head pain to 3.5/10 after session. Pt able to raise L arm up against gravity 78 degrees flexion AROM after session. Decreased L UE pain and improved ability to raise L UE up with caudal pressure to L first rib. Slight decrease in L UE symptoms with very gentle manual cervical traction but pt felt discomfort posterior superior head which decreased to baseline with rest. Pt tolerated session well without aggravation of symptoms.          PT Long Term Goals - 01/31/18 1910      PT LONG TERM GOAL #1   Title  Pt will have a decrease in L cervical and L UE pain to 5/10 or less at worst to promote ability to use L UE to perform functional tasks.     Baseline  8/10 at worst. (10/25/2017), (12/12/2017); 4/10 at worst for the past 7 days (01/31/2018)    Time  9    Period  Weeks    Status  Partially Met    Target Date  04/06/18      PT LONG TERM GOAL #2   Title  Pt will improve her L UE strength by at least 1/2 MMT grade to promote ability to perform functional tasks.     Baseline  Left: shoulder flexion 2+/5, elbow flexion 2+/5, wrist extension 2+/5 (10/25/2017); Not tested today secondary to pt stating pain everytime she lifts her L arm (12/12/2017); L shoulder flexion 3-/5 with anterior shoulder pain, elbow flexion 3-/5, wrist extension 3-/5 (01/31/2018)    Time  6    Period  Weeks    Status  Achieved      PT LONG TERM GOAL #3   Title  Patient will improve her FOTO score by at least 10 points as a demonstration of improved function.     Baseline  4 (filled out with husband's help 10/25/2017); 32 (with interpreter's help, 12/12/2017); 20 (with interpreter's help, 01/31/2018)    Time  4    Period  Weeks    Status  Achieved    Target Date  01/19/18      PT LONG TERM GOAL #4   Title  Patient will improve her Quick Dash score by at least 10% as a demonnstration of improved function.     Baseline  88.6% (filled out with husband's help 10/25/2017); 63.6% (with  interpreter's help 12/12/2017); 79.5% (with interpreter's help, 01/31/2018)    Time  9    Period  Weeks    Status  On-going    Target Date  04/06/18      PT LONG TERM GOAL #5   Title  Patient will improve her FOTO score to at least 42 points as a demonstration of improved function.     Baseline  32 (12/12/2017); 20 (with interpreter's help, 01/31/2018)    Time  9    Period  Weeks    Status  On-going    Target Date  04/06/18      Additional Long Term Goals   Additional Long Term Goals  Yes      PT LONG TERM GOAL #6   Title  Patient will improve her Quick Dash score to least 53% or less as a demonnstration of improved function.     Baseline  63% (12/12/2017); 79.5% (with interpreter's help, 01/31/2018)    Time  9    Period  Weeks    Status  On-going    Target Date  04/06/18      PT LONG TERM GOAL #7   Title  Patient will improve L UE strength to 3/5 or more MMT grade for L UE to promote ability to perform functional tasks.     Baseline  L shoulder flexion 3-/5 with anterior shoulder pain, elbow flexion 3-/5, wrist extension 3-/5 (01/31/2018)    Time  9    Period  Weeks    Status  New    Target Date  04/04/18            Plan - 03/06/18 1052    Clinical Impression Statement  Decreased L UE, neck and head pain to 3.5/10 after session. Pt able to raise L arm up against gravity 78 degrees flexion AROM after session. Decreased L UE pain and improved ability to raise L UE up with caudal pressure to L first rib. Slight decrease in L UE symptoms with very gentle manual cervical traction but pt felt discomfort posterior superior head which decreased to baseline with rest. Pt tolerated session well without aggravation of symptoms.     Rehab Potential  Fair    Clinical Impairments Affecting Rehab Potential  Chronicity of condition, multiple areas involved, language barrier, pain, weakness, difficulty using her L UE for tasks    PT Frequency  2x / week    PT Duration  Other (comment) 9 weeks  (6 weeks of PT after pt returns from trip)    PT Treatment/Interventions  Manual techniques;Patient/family education;Therapeutic activities;Therapeutic exercise;Passive range of motion;Dry needling;Neuromuscular re-education;Ultrasound;Electrical Stimulation;Iontophoresis 60m/ml Dexamethasone;Aquatic Therapy traction if appropriate    PT Next Visit Plan  manual techniques, ROM, gentle strengthening, modalities as needed    Consulted and Agree with Plan of Care  Patient       Patient will benefit from skilled therapeutic intervention in order to improve the following deficits and impairments:  Pain, Postural dysfunction, Improper body mechanics, Impaired UE functional use, Decreased strength, Decreased range of motion  Visit Diagnosis: Radiculopathy, cervical region  Cervicalgia  Chronic left shoulder pain  Pain in left arm  Muscle weakness (generalized)  Pain in left elbow  Pain in left forearm  Pain in left hand     Problem List Patient Active Problem List   Diagnosis Date Noted  . Abnormal brain scan 02/15/2017  . Inflammation of shoulder joint 02/15/2017  . Neck pain 02/15/2017  . Hemochromatosis, hereditary ([place] 01/18/2017  .  Hypertriglyceridemia 11/13/2016  . Blurry vision 05/24/2016  . Chronic tension-type headache, not intractable 05/24/2016  . Acute headache 04/19/2016  . Left arm pain 04/19/2016  . Rotator cuff tear 03/08/2016  . Carpal tunnel syndrome 03/08/2016  . Cervical radiculitis 03/08/2016    Joneen Boers PT, DPT   03/06/2018, 10:58 AM  [place] [facility] [address] [place], [address] Phone: [phone]   Fax:  [phone]  Name: Tammy Lam MRN: [id number] Date of Birth: 03-06-1956

## 2018-03-08 ENCOUNTER — Ambulatory Visit: Payer: Medicaid Other

## 2018-03-08 DIAGNOSIS — M6281 Muscle weakness (generalized): Secondary | ICD-10-CM

## 2018-03-08 DIAGNOSIS — M79602 Pain in left arm: Secondary | ICD-10-CM

## 2018-03-08 DIAGNOSIS — M542 Cervicalgia: Secondary | ICD-10-CM

## 2018-03-08 DIAGNOSIS — M5412 Radiculopathy, cervical region: Secondary | ICD-10-CM

## 2018-03-08 DIAGNOSIS — G8929 Other chronic pain: Secondary | ICD-10-CM

## 2018-03-08 DIAGNOSIS — M79642 Pain in left hand: Secondary | ICD-10-CM

## 2018-03-08 DIAGNOSIS — M79632 Pain in left forearm: Secondary | ICD-10-CM

## 2018-03-08 DIAGNOSIS — M25512 Pain in left shoulder: Secondary | ICD-10-CM

## 2018-03-08 DIAGNOSIS — M25522 Pain in left elbow: Secondary | ICD-10-CM

## 2018-03-08 NOTE — Patient Instructions (Signed)
Seated scapular depression isometrics  Sitting on a chair  ( Ngoi tren ghe )    Prop your left arm on the arm rest.  ( De tay trai dua tren tay vin cua ghe ngoi)   Press your shoulder blade down comfortably onto the arm rest for 5 seconds ( Nhun dau vai ben trai  xuong phia duoi)     Repeat 5 times ( lam nhu vay nam lan )    Perform at least 3 sets daily.( Moi ngay lam it nhat 3 lan)

## 2018-03-08 NOTE — Therapy (Signed)
[facility] [address], [place], [address] Phone: [phone]   Fax:  [phone]  Physical Therapy Treatment  Patient Details  Name: Tammy Lam MRN: [id number] Date of Birth: 10/03/1955 Referring Provider: Gurney Maxin, MD   Encounter Date: 03/08/2018  PT End of Session - 03/08/18 0944    Visit Number  14    Number of Visits  31    Date for PT Re-Evaluation  04/06/18    Authorization Type  3 of 12    Authorization Time Period  Medicaid    PT Start Time  [phone]    PT Stop Time  1036    PT Time Calculation (min)  52 min    Activity Tolerance  Patient limited by pain    Behavior During Therapy  [facility] for tasks assessed/performed       Past Medical History:  Diagnosis Date  . Elevated ferritin   . Headache   . Hemochromatosis   . Hyperlipidemia   . Hyponatremia   . Medical history non-contributory   . Shoulder injury     Past Surgical History:  Procedure Laterality Date  . ANKLE SURGERY Right     There were no vitals filed for this visit.  Subjective Assessment - 03/08/18 0949    Subjective  Pt states taking less pain medication since starting physical therapy. 4/10 L head, neck, and L UE pain currently.     Pertinent History  L arm and shoulder pain: 16 years ago when pt was at work, pt had to lift heavy items. Pt had electric pad treatment. Also went to PT and for some reason, she could not do exercises.   Pt could not move her arm.  2 years ago pt started taking medication.  1 month ago pt started taking injections.  Medication does help however the injection helps with the pain but also has pain in her elbow to her hand and the back of her neck.  Feels pain from elbow down her whole arm and hand. Feels a lot of pain on the palm of her L hand (pt observed to rub her hand a lot along the L C7/C8 dermatome on her hand).  Denies loss of bowel or bladder control. When she moves her L hand, her pain also  affects her head down to her feet on the L side.  Doctor just gave her injections, no mention of surgery.  Also has pain in the L side of her tongue, eye, face.  Pt states that the neurologist knows about it.  Does not think she can exercise her L UE due to pain.  Last PT was 15-16 years ago.   Pt is L hand dominant.     Patient Stated Goals  Pt expresses desire to get better.     Currently in Pain?  Yes    Pain Score  4     Pain Onset  More than a month ago                               PT Education - 03/08/18 1019    Education provided  Yes    Education Details  ther-ex    [employer]) Educated  Patient    Methods  Explanation;Demonstration;Tactile cues;Verbal cues    Comprehension  Returned demonstration;Verbalized understanding         Objectives  Interpreter Maudie Mercury)    Manual  therapy    Seated gentle manual cervical distraction 10x, then 10x5 seconds for 3 sets . Improved L UE symptoms. Pt states that the treatment helps   Seated STM to bilateral cervical paraspinal muscles.   Seated STM to distal L scalene and first rib area           Caudal pressure to L first rib area with L shoulder flexion 5x3     Therapeutic exercise  Seated L scapular depression isometrics, gentle resistance from PT 10x2with 10 seconds (Upgrade)  L scapular depression isometrics with L forearm on arm rest 5x5 seconds for 3 sets              Pt states exercise helping her L UE.              Given as part of HEP. Pt demonstrated and verbalized understanding  Seated scapular retraction 10x. Discomfort which eases with rest.    Increased time secondary to interpretation.   Improved exercise technique, movement at target joints, use of target muscles after min to mod verbal, visual, tactile cues.     Pt able to achieve 91 degrees L shoulder flexion AROM after session. Continued working on gentle cervical traction to decrease pressure to L UE  nerves, as well as STM to cervical paraspinal muscles and decreasing L scalene muscle tension to help decrease pain and improve ability to move her L UE. Pt making good progress with decreasing overall pain based on subjective reports. Pt currently taking less pain medications compared to before starting physical therapy based on pt reports.      PT Long Term Goals - 01/31/18 1910      PT LONG TERM GOAL #1   Title  Pt will have a decrease in L cervical and L UE pain to 5/10 or less at worst to promote ability to use L UE to perform functional tasks.     Baseline  8/10 at worst. (10/25/2017), (12/12/2017); 4/10 at worst for the past 7 days (01/31/2018)    Time  9    Period  Weeks    Status  Partially Met    Target Date  04/06/18      PT LONG TERM GOAL #2   Title  Pt will improve her L UE strength by at least 1/2 MMT grade to promote ability to perform functional tasks.     Baseline  Left: shoulder flexion 2+/5, elbow flexion 2+/5, wrist extension 2+/5 (10/25/2017); Not tested today secondary to pt stating pain everytime she lifts her L arm (12/12/2017); L shoulder flexion 3-/5 with anterior shoulder pain, elbow flexion 3-/5, wrist extension 3-/5 (01/31/2018)    Time  6    Period  Weeks    Status  Achieved      PT LONG TERM GOAL #3   Title  Patient will improve her FOTO score by at least 10 points as a demonstration of improved function.     Baseline  4 (filled out with husband's help 10/25/2017); 32 (with interpreter's help, 12/12/2017); 20 (with interpreter's help, 01/31/2018)    Time  4    Period  Weeks    Status  Achieved    Target Date  01/19/18      PT LONG TERM GOAL #4   Title  Patient will improve her Quick Dash score by at least 10% as a demonnstration of improved function.     Baseline  88.6% (filled out with husband's help 10/25/2017); 63.6% (with interpreter's help 12/12/2017);  79.5% (with interpreter's help, 01/31/2018)    Time  9    Period  Weeks    Status  On-going    Target Date   04/06/18      PT LONG TERM GOAL #5   Title  Patient will improve her FOTO score to at least 42 points as a demonstration of improved function.     Baseline  32 (12/12/2017); 20 (with interpreter's help, 01/31/2018)    Time  9    Period  Weeks    Status  On-going    Target Date  04/06/18      Additional Long Term Goals   Additional Long Term Goals  Yes      PT LONG TERM GOAL #6   Title  Patient will improve her Quick Dash score to least 53% or less as a demonnstration of improved function.     Baseline  63% (12/12/2017); 79.5% (with interpreter's help, 01/31/2018)    Time  9    Period  Weeks    Status  On-going    Target Date  04/06/18      PT LONG TERM GOAL #7   Title  Patient will improve L UE strength to 3/5 or more MMT grade for L UE to promote ability to perform functional tasks.     Baseline  L shoulder flexion 3-/5 with anterior shoulder pain, elbow flexion 3-/5, wrist extension 3-/5 (01/31/2018)    Time  9    Period  Weeks    Status  New    Target Date  04/04/18            Plan - 03/08/18 0942    Clinical Impression Statement  Pt able to achieve 91 degrees L shoulder flexion AROM after session. Continued working on gentle cervical traction to decrease pressure to L UE nerves, as well as STM to cervical paraspinal muscles and decreasing L scalene muscle tension to help decrease pain and improve ability to move her L UE. Pt making good progress with decreasing overall pain based on subjective reports. Pt currently taking less pain medications compared to before starting physical therapy based on pt reports.     Rehab Potential  Fair    Clinical Impairments Affecting Rehab Potential  Chronicity of condition, multiple areas involved, language barrier, pain, weakness, difficulty using her L UE for tasks    PT Frequency  2x / week    PT Duration  Other (comment) 9 weeks (6 weeks of PT after pt returns from trip)    PT Treatment/Interventions  Manual techniques;Patient/family  education;Therapeutic activities;Therapeutic exercise;Passive range of motion;Dry needling;Neuromuscular re-education;Ultrasound;Electrical Stimulation;Iontophoresis 74m/ml Dexamethasone;Aquatic Therapy traction if appropriate    PT Next Visit Plan  manual techniques, ROM, gentle strengthening, modalities as needed    Consulted and Agree with Plan of Care  Patient       Patient will benefit from skilled therapeutic intervention in order to improve the following deficits and impairments:  Pain, Postural dysfunction, Improper body mechanics, Impaired UE functional use, Decreased strength, Decreased range of motion  Visit Diagnosis: Radiculopathy, cervical region  Cervicalgia  Chronic left shoulder pain  Pain in left arm  Muscle weakness (generalized)  Pain in left elbow  Pain in left forearm  Pain in left hand     Problem List Patient Active Problem List   Diagnosis Date Noted  . Abnormal brain scan 02/15/2017  . Inflammation of shoulder joint 02/15/2017  . Neck pain 02/15/2017  . Hemochromatosis, hereditary ([place] 01/18/2017  .  Hypertriglyceridemia 11/13/2016  . Blurry vision 05/24/2016  . Chronic tension-type headache, not intractable 05/24/2016  . Acute headache 04/19/2016  . Left arm pain 04/19/2016  . Rotator cuff tear 03/08/2016  . Carpal tunnel syndrome 03/08/2016  . Cervical radiculitis 03/08/2016    Joneen Boers PT, DPT   03/08/2018, 10:56 AM  [facility] [address], [place], [address] Phone: [phone]   Fax:  [phone]  Name: Tammy Lam MRN: [id number] Date of Birth: 1956/03/07

## 2018-03-14 ENCOUNTER — Ambulatory Visit: Payer: Medicaid Other

## 2018-03-14 DIAGNOSIS — M79602 Pain in left arm: Secondary | ICD-10-CM

## 2018-03-14 DIAGNOSIS — M79632 Pain in left forearm: Secondary | ICD-10-CM

## 2018-03-14 DIAGNOSIS — M25512 Pain in left shoulder: Secondary | ICD-10-CM

## 2018-03-14 DIAGNOSIS — M542 Cervicalgia: Secondary | ICD-10-CM

## 2018-03-14 DIAGNOSIS — G8929 Other chronic pain: Secondary | ICD-10-CM

## 2018-03-14 DIAGNOSIS — M5412 Radiculopathy, cervical region: Secondary | ICD-10-CM | POA: Diagnosis not present

## 2018-03-14 DIAGNOSIS — M6281 Muscle weakness (generalized): Secondary | ICD-10-CM

## 2018-03-14 DIAGNOSIS — M25522 Pain in left elbow: Secondary | ICD-10-CM

## 2018-03-14 DIAGNOSIS — M79642 Pain in left hand: Secondary | ICD-10-CM

## 2018-03-14 NOTE — Therapy (Signed)
[facility] [address] [place], [address] Phone: [phone]   Fax:  [phone]  Physical Therapy Treatment  Patient Details  Name: Tammy Lam MRN: [id number] Date of Birth: Jul 08, 1956 Referring Provider: Gurney Maxin, MD   Encounter Date: 03/14/2018  PT End of Session - 03/14/18 1123    Visit Number  15    Number of Visits  31    Date for PT Re-Evaluation  04/06/18    Authorization Type  4 of 12    Authorization Time Period  Medicaid    PT Start Time  1123    PT Stop Time  1208    PT Time Calculation (min)  45 min    Activity Tolerance  Patient limited by pain    Behavior During Therapy  [facility] for tasks assessed/performed       Past Medical History:  Diagnosis Date  . Elevated ferritin   . Headache   . Hemochromatosis   . Hyperlipidemia   . Hyponatremia   . Medical history non-contributory   . Shoulder injury     Past Surgical History:  Procedure Laterality Date  . ANKLE SURGERY Right     There were no vitals filed for this visit.  Subjective Assessment - 03/14/18 1124    Subjective  L neck, head and arm 5/10 currently. Better after last session. Increased pain when she tries to turn her head.     Pertinent History  L arm and shoulder pain: 16 years ago when pt was at work, pt had to lift heavy items. Pt had electric pad treatment. Also went to PT and for some reason, she could not do exercises.   Pt could not move her arm.  2 years ago pt started taking medication.  1 month ago pt started taking injections.  Medication does help however the injection helps with the pain but also has pain in her elbow to her hand and the back of her neck.  Feels pain from elbow down her whole arm and hand. Feels a lot of pain on the palm of her L hand (pt observed to rub her hand a lot along the L C7/C8 dermatome on her hand).  Denies loss of bowel or bladder control. When she moves her L hand, her pain also  affects her head down to her feet on the L side.  Doctor just gave her injections, no mention of surgery.  Also has pain in the L side of her tongue, eye, face.  Pt states that the neurologist knows about it.  Does not think she can exercise her L UE due to pain.  Last PT was 15-16 years ago.   Pt is L hand dominant.     Patient Stated Goals  Pt expresses desire to get better.     Currently in Pain?  Yes    Pain Score  5     Pain Onset  More than a month ago                               PT Education - 03/14/18 1130    Education provided  Yes    Education Details  ther-ex, HEP    Person(s) Educated  Patient    Methods  Explanation;Demonstration;Tactile cues;Verbal cues;Handout    Comprehension  Returned demonstration;Verbalized understanding         Occupational hygienist Maudie Mercury)  Manual therapy   Seated STM L upper trap area Seated STM to distal L scalene and first rib area  Therapeutic exercise   Cervical rotation  R 25 degrees  L 25 degrees  Seated bilateral scapular retraction  5x5   R cervical rotation improved to 30 degrees  Seated L scapular depression isometrics with L forearm on arm rest 8x5 seconds, then 5x5 seconds  Seated L scapular retraction with gentle manual resistance targeting lower trap 5x5 seconds for 5 sets   Pt states feeling like she can open her eyes after first set.    Caudal pressure to L first rib area with L shoulder flexion 5x3  Increased time secondary to interpretation.  Improved exercise technique, movement at target joints, use of target muscles after min to mod verbal, visual, tactile cues.  Continued working on improving lower trap muscle strengthening, scapular retraction, and decreasing L upper trap and scalene muscle tension. Per patient, L UE feels less heavy, and better able to open her L eye after session.  Decreased pain to 3/10 after session. Gave scapular retraction exercise  as part of her HEP to help continue progress.     PT Long Term Goals - 01/31/18 1910      PT LONG TERM GOAL #1   Title  Pt will have a decrease in L cervical and L UE pain to 5/10 or less at worst to promote ability to use L UE to perform functional tasks.     Baseline  8/10 at worst. (10/25/2017), (12/12/2017); 4/10 at worst for the past 7 days (01/31/2018)    Time  9    Period  Weeks    Status  Partially Met    Target Date  04/06/18      PT LONG TERM GOAL #2   Title  Pt will improve her L UE strength by at least 1/2 MMT grade to promote ability to perform functional tasks.     Baseline  Left: shoulder flexion 2+/5, elbow flexion 2+/5, wrist extension 2+/5 (10/25/2017); Not tested today secondary to pt stating pain everytime she lifts her L arm (12/12/2017); L shoulder flexion 3-/5 with anterior shoulder pain, elbow flexion 3-/5, wrist extension 3-/5 (01/31/2018)    Time  6    Period  Weeks    Status  Achieved      PT LONG TERM GOAL #3   Title  Patient will improve her FOTO score by at least 10 points as a demonstration of improved function.     Baseline  4 (filled out with husband's help 10/25/2017); 32 (with interpreter's help, 12/12/2017); 20 (with interpreter's help, 01/31/2018)    Time  4    Period  Weeks    Status  Achieved    Target Date  01/19/18      PT LONG TERM GOAL #4   Title  Patient will improve her Quick Dash score by at least 10% as a demonnstration of improved function.     Baseline  88.6% (filled out with husband's help 10/25/2017); 63.6% (with interpreter's help 12/12/2017); 79.5% (with interpreter's help, 01/31/2018)    Time  9    Period  Weeks    Status  On-going    Target Date  04/06/18      PT LONG TERM GOAL #5   Title  Patient will improve her FOTO score to at least 42 points as a demonstration of improved function.     Baseline  32 (12/12/2017); 20 (with interpreter's help, 01/31/2018)  Time  9    Period  Weeks    Status  On-going    Target Date  04/06/18       Additional Long Term Goals   Additional Long Term Goals  Yes      PT LONG TERM GOAL #6   Title  Patient will improve her Quick Dash score to least 53% or less as a demonnstration of improved function.     Baseline  63% (12/12/2017); 79.5% (with interpreter's help, 01/31/2018)    Time  9    Period  Weeks    Status  On-going    Target Date  04/06/18      PT LONG TERM GOAL #7   Title  Patient will improve L UE strength to 3/5 or more MMT grade for L UE to promote ability to perform functional tasks.     Baseline  L shoulder flexion 3-/5 with anterior shoulder pain, elbow flexion 3-/5, wrist extension 3-/5 (01/31/2018)    Time  9    Period  Weeks    Status  New    Target Date  04/04/18            Plan - 03/14/18 1141    Clinical Impression Statement  Continued working on improving lower trap muscle strengthening, scapular retraction, and decreasing L upper trap and scalene muscle tension. Per patient, L UE feels less heavy, and better able to open her L eye after session.  Decreased pain to 3/10 after session. Gave scapular retraction exercise as part of her HEP to help continue progress.     Rehab Potential  Fair    Clinical Impairments Affecting Rehab Potential  Chronicity of condition, multiple areas involved, language barrier, pain, weakness, difficulty using her L UE for tasks    PT Frequency  2x / week    PT Duration  Other (comment) 9 weeks (6 weeks of PT after pt returns from trip)    PT Treatment/Interventions  Manual techniques;Patient/family education;Therapeutic activities;Therapeutic exercise;Passive range of motion;Dry needling;Neuromuscular re-education;Ultrasound;Electrical Stimulation;Iontophoresis 53m/ml Dexamethasone;Aquatic Therapy traction if appropriate    PT Next Visit Plan  manual techniques, ROM, gentle strengthening, modalities as needed    Consulted and Agree with Plan of Care  Patient       Patient will benefit from skilled therapeutic intervention in order  to improve the following deficits and impairments:  Pain, Postural dysfunction, Improper body mechanics, Impaired UE functional use, Decreased strength, Decreased range of motion  Visit Diagnosis: Radiculopathy, cervical region  Cervicalgia  Chronic left shoulder pain  Pain in left arm  Muscle weakness (generalized)  Pain in left elbow  Pain in left forearm  Pain in left hand     Problem List Patient Active Problem List   Diagnosis Date Noted  . Abnormal brain scan 02/15/2017  . Inflammation of shoulder joint 02/15/2017  . Neck pain 02/15/2017  . Hemochromatosis, hereditary ([place] 01/18/2017  . Hypertriglyceridemia 11/13/2016  . Blurry vision 05/24/2016  . Chronic tension-type headache, not intractable 05/24/2016  . Acute headache 04/19/2016  . Left arm pain 04/19/2016  . Rotator cuff tear 03/08/2016  . Carpal tunnel syndrome 03/08/2016  . Cervical radiculitis 03/08/2016    MJoneen BoersPT, DPT   03/14/2018, 12:24 PM  [facility] [address] [place] [address]Phone: [phone]  Fax:  [phone] Name: Tammy LechugaMRN: [id number] of Birth: [birth year]

## 2018-03-14 NOTE — Patient Instructions (Signed)
      Scapular Retraction (Standing)    Perform throughout the day comfortably    With arms at sides, pinch shoulder blades together. ( Canh tay tren de phia ben canh, di [place] ha[place] vai sat lai gan nhau ve phia lung)    Hold for 5 seconds. ( Giu lai 5 giay)     Repeat __5__ times per set. Do __at least 3__ sets .   (Lap lai nam lan. Lam tat ca ba lan.)   Lap lai dong tac nay nhieu lan trong ngay.Moi gio lam lai mot lan neu co the.     Copyright  VHI. All rights reserved.

## 2018-03-21 ENCOUNTER — Ambulatory Visit: Payer: Medicaid Other

## 2018-03-21 DIAGNOSIS — M79642 Pain in left hand: Secondary | ICD-10-CM

## 2018-03-21 DIAGNOSIS — M5412 Radiculopathy, cervical region: Secondary | ICD-10-CM

## 2018-03-21 DIAGNOSIS — M79632 Pain in left forearm: Secondary | ICD-10-CM

## 2018-03-21 DIAGNOSIS — M79602 Pain in left arm: Secondary | ICD-10-CM

## 2018-03-21 DIAGNOSIS — M542 Cervicalgia: Secondary | ICD-10-CM

## 2018-03-21 DIAGNOSIS — M6281 Muscle weakness (generalized): Secondary | ICD-10-CM

## 2018-03-21 DIAGNOSIS — M25522 Pain in left elbow: Secondary | ICD-10-CM

## 2018-03-21 DIAGNOSIS — G8929 Other chronic pain: Secondary | ICD-10-CM

## 2018-03-21 DIAGNOSIS — M25512 Pain in left shoulder: Secondary | ICD-10-CM

## 2018-03-21 NOTE — Patient Instructions (Signed)
  Gave R L shoulder flexion with caudal pressure to L first rib area with strap 3x5 daily comfortably as part of her HEP. Video with interpreter translation using pt cell phone utilized. Pt demonstrated and verbalized understanding.

## 2018-03-21 NOTE — Therapy (Signed)
[facility] [address], [place], [address] Phone: [phone]   Fax:  [phone]  Physical Therapy Treatment  Patient Details  Name: Tammy Lam MRN: [id number] Date of Birth: [birth year] Referring Provider: Gurney Maxin, MD   Encounter Date: 03/21/2018  PT End of Session - 03/21/18 1105    Visit Number  16    Number of Visits  31    Date for PT Re-Evaluation  04/06/18    Authorization Type  5 of 12    Authorization Time Period  Medicaid    PT Start Time  1106    PT Stop Time  1154    PT Time Calculation (min)  48 min    Activity Tolerance  Patient limited by pain    Behavior During Therapy  [facility] for tasks assessed/performed       Past Medical History:  Diagnosis Date  . Elevated ferritin   . Headache   . Hemochromatosis   . Hyperlipidemia   . Hyponatremia   . Medical history non-contributory   . Shoulder injury     Past Surgical History:  Procedure Laterality Date  . ANKLE SURGERY Right     There were no vitals filed for this visit.  Subjective Assessment - 03/21/18 1107    Subjective  L face is good. L UE arm 3/10 currently.     Pertinent History  L arm and shoulder pain: 16 years ago when pt was at work, pt had to lift heavy items. Pt had electric pad treatment. Also went to PT and for some reason, she could not do exercises.   Pt could not move her arm.  2 years ago pt started taking medication.  1 month ago pt started taking injections.  Medication does help however the injection helps with the pain but also has pain in her elbow to her hand and the back of her neck.  Feels pain from elbow down her whole arm and hand. Feels a lot of pain on the palm of her L hand (pt observed to rub her hand a lot along the L C7/C8 dermatome on her hand).  Denies loss of bowel or bladder control. When she moves her L hand, her pain also affects her head down to her feet on the L side.  Doctor just gave her  injections, no mention of surgery.  Also has pain in the L side of her tongue, eye, face.  Pt states that the neurologist knows about it.  Does not think she can exercise her L UE due to pain.  Last PT was 15-16 years ago.   Pt is L hand dominant.     Patient Stated Goals  Pt expresses desire to get better.     Currently in Pain?  Yes    Pain Score  3     Pain Onset  More than a month ago                               PT Education - 03/21/18 1127    Education provided  Yes    Education Details  ther-ex, HEP    Person(s) Educated  Patient    Methods  Explanation;Demonstration;Tactile cues;Verbal cues;Handout    Comprehension  Returned demonstration;Verbalized understanding         Objectives  Interpreter Maudie Mercury)    Manual therapy  Seated STM L upper trap area Seated  STM to distal L scalene and first rib area     Therapeutic exercise  Caudal pressure to L first rib area with L shoulder flexion 5x3 with PT.   Then with strap 5x4. Reviewed and given as part of her HEP using her cell phone (video recording of her performing exercise). Increased time secondary to interpretation. Pt demonstrated and verbalized understanding.   Seated bilateral scapular retraction  5x5  Seated L scapular depression isometrics with L forearm on arm rest  5x5 seconds for 3 sets.   Seated L scapular retraction with gentle manual resistance targeting lower trap 5x5 seconds for 2 sets                Increased time secondary to interpretation.  Improved exercise technique, movement at target joints, use of target muscles after min to mod verbal, visual, tactile cues.   Continued working on decreasing L upper trap and scalene muscle tension to promote ability to raise L arm up more comfortably against gravity.  Pt seems to be making good progress with decreasing L UE pain with starting pain level of a 3/10 today which is one of the best starting levels  she has had since starting PT. Pt will benefit from continued skilled physical therapy services to continue decreasing tension to the aforementioned muscles as well as improving lower trap muscle activation and improving ability to raise her L arm up, reach, to promote ability to perform functional tasks at home. Pt tolerated session well without aggravation of symptoms.     PT Long Term Goals - 01/31/18 1910      PT LONG TERM GOAL #1   Title  Pt will have a decrease in L cervical and L UE pain to 5/10 or less at worst to promote ability to use L UE to perform functional tasks.     Baseline  8/10 at worst. (10/25/2017), (12/12/2017); 4/10 at worst for the past 7 days (01/31/2018)    Time  9    Period  Weeks    Status  Partially Met    Target Date  04/06/18      PT LONG TERM GOAL #2   Title  Pt will improve her L UE strength by at least 1/2 MMT grade to promote ability to perform functional tasks.     Baseline  Left: shoulder flexion 2+/5, elbow flexion 2+/5, wrist extension 2+/5 (10/25/2017); Not tested today secondary to pt stating pain everytime she lifts her L arm (12/12/2017); L shoulder flexion 3-/5 with anterior shoulder pain, elbow flexion 3-/5, wrist extension 3-/5 (01/31/2018)    Time  6    Period  Weeks    Status  Achieved      PT LONG TERM GOAL #3   Title  Patient will improve her FOTO score by at least 10 points as a demonstration of improved function.     Baseline  4 (filled out with husband's help 10/25/2017); 32 (with interpreter's help, 12/12/2017); 20 (with interpreter's help, 01/31/2018)    Time  4    Period  Weeks    Status  Achieved    Target Date  01/19/18      PT LONG TERM GOAL #4   Title  Patient will improve her Quick Dash score by at least 10% as a demonnstration of improved function.     Baseline  88.6% (filled out with husband's help 10/25/2017); 63.6% (with interpreter's help 12/12/2017); 79.5% (with interpreter's help, 01/31/2018)    Time  9  Period  Weeks    Status   On-going    Target Date  04/06/18      PT LONG TERM GOAL #5   Title  Patient will improve her FOTO score to at least 42 points as a demonstration of improved function.     Baseline  32 (12/12/2017); 20 (with interpreter's help, 01/31/2018)    Time  9    Period  Weeks    Status  On-going    Target Date  04/06/18      Additional Long Term Goals   Additional Long Term Goals  Yes      PT LONG TERM GOAL #6   Title  Patient will improve her Quick Dash score to least 53% or less as a demonnstration of improved function.     Baseline  63% (12/12/2017); 79.5% (with interpreter's help, 01/31/2018)    Time  9    Period  Weeks    Status  On-going    Target Date  04/06/18      PT LONG TERM GOAL #7   Title  Patient will improve L UE strength to 3/5 or more MMT grade for L UE to promote ability to perform functional tasks.     Baseline  L shoulder flexion 3-/5 with anterior shoulder pain, elbow flexion 3-/5, wrist extension 3-/5 (01/31/2018)    Time  9    Period  Weeks    Status  New    Target Date  04/04/18            Plan - 03/21/18 1103    Clinical Impression Statement  Continued working on decreasing L upper trap and scalene muscle tension to promote ability to raise L arm up more comfortably against gravity.  Pt seems to be making good progress with decreasing L UE pain with starting pain level of a 3/10 today which is one of the best starting levels she has had since starting PT. Pt will benefit from continued skilled physical therapy services to continue decreasing tension to the aforementioned muscles as well as improving lower trap muscle activation and improving ability to raise her L arm up, reach, to promote ability to perform functional tasks at home. Pt tolerated session well without aggravation of symptoms.     Rehab Potential  Fair    Clinical Impairments Affecting Rehab Potential  Chronicity of condition, multiple areas involved, language barrier, pain, weakness, difficulty  using her L UE for tasks    PT Frequency  2x / week    PT Duration  Other (comment) 9 weeks (6 weeks of PT after pt returns from trip)    PT Treatment/Interventions  Manual techniques;Patient/family education;Therapeutic activities;Therapeutic exercise;Passive range of motion;Dry needling;Neuromuscular re-education;Ultrasound;Electrical Stimulation;Iontophoresis 34m/ml Dexamethasone;Aquatic Therapy traction if appropriate    PT Next Visit Plan  manual techniques, ROM, gentle strengthening, modalities as needed    Consulted and Agree with Plan of Care  Patient       Patient will benefit from skilled therapeutic intervention in order to improve the following deficits and impairments:  Pain, Postural dysfunction, Improper body mechanics, Impaired UE functional use, Decreased strength, Decreased range of motion  Visit Diagnosis: Radiculopathy, cervical region  Cervicalgia  Chronic left shoulder pain  Pain in left arm  Muscle weakness (generalized)  Pain in left elbow  Pain in left forearm  Pain in left hand     Problem List Patient Active Problem List   Diagnosis Date Noted  . Abnormal brain scan 02/15/2017  .  Inflammation of shoulder joint 02/15/2017  . Neck pain 02/15/2017  . Hemochromatosis, hereditary ([place]) 01/18/2017  . Hypertriglyceridemia 11/13/2016  . Blurry vision 05/24/2016  . Chronic tension-type headache, not intractable 05/24/2016  . Acute headache 04/19/2016  . Left arm pain 04/19/2016  . Rotator cuff tear 03/08/2016  . Carpal tunnel syndrome 03/08/2016  . Cervical radiculitis 03/08/2016    Joneen Boers PT, DPT   03/21/2018, 12:11 PM  [facility] [address] [place], [address] Phone: [phone]   Fax:  [phone]  Name: Ceyda Peterka MRN: [id number] Date of Birth: [birth year]

## 2018-03-28 ENCOUNTER — Ambulatory Visit: Payer: Medicaid Other | Attending: Neurology

## 2018-03-28 DIAGNOSIS — M5412 Radiculopathy, cervical region: Secondary | ICD-10-CM | POA: Insufficient documentation

## 2018-03-28 DIAGNOSIS — M25522 Pain in left elbow: Secondary | ICD-10-CM

## 2018-03-28 DIAGNOSIS — G8929 Other chronic pain: Secondary | ICD-10-CM | POA: Diagnosis present

## 2018-03-28 DIAGNOSIS — M6281 Muscle weakness (generalized): Secondary | ICD-10-CM | POA: Diagnosis present

## 2018-03-28 DIAGNOSIS — M79632 Pain in left forearm: Secondary | ICD-10-CM | POA: Insufficient documentation

## 2018-03-28 DIAGNOSIS — M25512 Pain in left shoulder: Secondary | ICD-10-CM | POA: Insufficient documentation

## 2018-03-28 DIAGNOSIS — M79642 Pain in left hand: Secondary | ICD-10-CM | POA: Diagnosis present

## 2018-03-28 DIAGNOSIS — M542 Cervicalgia: Secondary | ICD-10-CM | POA: Insufficient documentation

## 2018-03-28 DIAGNOSIS — M79602 Pain in left arm: Secondary | ICD-10-CM | POA: Insufficient documentation

## 2018-03-28 NOTE — Therapy (Signed)
[facility] [address] [place], [address] Phone: [phone]   Fax:  [phone]  Physical Therapy Treatment  Patient Details  Name: Tammy Lam  MRN: [id number] Date of Birth: 1955/10/30 Referring Provider: Gurney Maxin, MD   Encounter Date: 03/28/2018   PT End of Session - 03/28/18 1120    Visit Number  17    Number of Visits  28    Date for PT Re-Evaluation  06/22/18    Authorization Type  6 of 12    Authorization Time Period  Medicaid    PT Start Time  1324    PT Stop Time  1214    PT Time Calculation (min)  50 min    Activity Tolerance  Patient limited by pain    Behavior During Therapy  [facility] for tasks assessed/performed       Past Medical History:  Diagnosis Date  . Elevated ferritin   . Headache   . Hemochromatosis   . Hyperlipidemia   . Hyponatremia   . Medical history non-contributory   . Shoulder injury     Past Surgical History:  Procedure Laterality Date  . ANKLE SURGERY Right     There were no vitals filed for this visit.  Subjective Assessment - 03/28/18 1127    Subjective  3.5/10 currently.  Going to [place] on 04/03/2018 for one month. Coming back April 30, 2018. The first rib HEP is making her feel better     Pertinent History  L arm and shoulder pain: 16 years ago when pt was at work, pt had to lift heavy items. Pt had electric pad treatment. Also went to PT and for some reason, she could not do exercises.   Pt could not move her arm.  2 years ago pt started taking medication.  1 month ago pt started taking injections.  Medication does help however the injection helps with the pain but also has pain in her elbow to her hand and the back of her neck.  Feels pain from elbow down her whole arm and hand. Feels a lot of pain on the palm of her L hand (pt observed to rub her hand a lot along the L C7/C8 dermatome on her hand).  Denies loss of bowel or bladder control. When she moves  her L hand, her pain also affects her head down to her feet on the L side.  Doctor just gave her injections, no mention of surgery.  Also has pain in the L side of her tongue, eye, face.  Pt states that the neurologist knows about it.  Does not think she can exercise her L UE due to pain.  Last PT was 15-16 years ago.   Pt is L hand dominant.     Patient Stated Goals  Pt expresses desire to get better.     Currently in Pain?  Yes    Pain Score  3  3.5/10    Pain Onset  More than a month ago         [facility] PT Assessment - 03/28/18 1441      Observation/Other Assessments   Focus on Therapeutic Outcomes (FOTO)   16    Quick DASH   65.9%      AROM   Left Shoulder Flexion  112 Degrees with pain      Strength   Left Shoulder Flexion  3-/5 125 degrees with pain    Left Elbow Flexion  3-/5    Left Wrist Extension  3-/5                           PT Education - 03/28/18 1232    Education provided  Yes    Education Details  ther-ex, plan of care    Person(s) Educated  Patient    Methods  Explanation;Demonstration;Tactile cues;Verbal cues    Comprehension  Returned demonstration;Verbalized understanding         Objectives   Interpreter language line used [phone] Then Maudie Mercury arrived (interpreter)   Phone placed on speaker. Pt provided verbal permission.   The first rib HEP is making her feel better     Manual therapy  Seated STM to posterior cervical paraspinal muscles Seated STM L upper trap area Seated STM to distal L scalene and first rib area   Therapeutic exercise  Seated bilateral scapular retraction 5x 3-5 seconds. Discomfort which eases with rest  Then 5x5  Seated L scapular retraction with gentle manual resistance targeting lower trap 5x5 seconds for 3 sets   Seated L scapular depression isometrics with L forearm on arm rest  5x5 seconds for 3 sets.   Seated manually resisted L scapular depression isometrics with PT 5x5  seconds for 3 sets    Caudal pressure to L first rib area with L shoulder flexion 5x3 with PT.             Seated manually resisted R scapular retraction targeting lower trap to help decrease upper trap tension 5x5 seconds for 3 sets   L shoulder AROM 112 degrees flexion with posterior L shoulder pain L elbow flexion AROM 125 degrees L wrist extension 55 degrees AROM   Increased time secondary to interpretation.  Improved exercise technique, movement at target joints, use of target muscles after min to mod verbal, visual, tactile cues.   Pt demonstrates overall improved L UE strength and ability to raise her L arm up, overall decreased L neck, and L UE pain level at worst, overall improved ability to perform functional tasks at home (improved Quick Dash score since last progress report on 01/31/2018) since initial evaluation. Neck and L UE pain seems to respond well with treatment to decrease tension to L upper trap, scalene, posterior cervical paraspinal muscles and improving lower trap muscle activation. Pt overall making progress with PT towards goals. Challenges to progress include chronicity of condition, pain, and weakness. Patient will benefit from continued skilled physical therapy services to decrease pain, improve strength, and ability to use her L UE to perform functional tasks at home.       PT Long Term Goals - 03/28/18 1208      PT LONG TERM GOAL #1   Title  Pt will have a decrease in L cervical and L UE pain to 5/10 or less at worst to promote ability to use L UE to perform functional tasks.     Baseline  8/10 at worst. (10/25/2017), (12/12/2017); 4/10 at worst for the past 7 days (01/31/2018); 5/10 pain at worst for the past 7 days (03/28/2018)    Time  12    Period  Weeks    Status  Partially Met    Target Date  06/22/18      PT LONG TERM GOAL #2   Title  Pt will improve her L UE strength by at least 1/2 MMT grade to promote ability to perform functional tasks.  Baseline  Left: shoulder flexion 2+/5, elbow flexion 2+/5, wrist extension 2+/5 (10/25/2017); Not tested today secondary to pt stating pain everytime she lifts her L arm (12/12/2017); L shoulder flexion 3-/5 with anterior shoulder pain, elbow flexion 3-/5, wrist extension 3-/5 (01/31/2018)    Time  6    Period  Weeks    Status  Achieved      PT LONG TERM GOAL #3   Title  Patient will improve her FOTO score by at least 10 points as a demonstration of improved function.     Baseline  4 (filled out with husband's help 10/25/2017); 32 (with interpreter's help, 12/12/2017); 20 (with interpreter's help, 01/31/2018);  16 (with interpreter's help, 03/28/2018)    Time  4    Period  Weeks    Status  Achieved      PT LONG TERM GOAL #4   Title  Patient will improve her Quick Dash score by at least 10% as a demonnstration of improved function.     Baseline  88.6% (filled out with husband's help 10/25/2017); 63.6% (with interpreter's help 12/12/2017); 79.5% (with interpreter's help, 01/31/2018); 65.9% wiht interpreter's help (03/28/2018)    Time  9    Period  Weeks    Status  Achieved    Target Date  04/06/18      PT LONG TERM GOAL #5   Title  Patient will improve her FOTO score to at least 42 points as a demonstration of improved function.     Baseline  32 (12/12/2017); 20 (with interpreter's help, 01/31/2018); 16 (with interpreter's help)    Time  12    Period  Weeks    Status  On-going    Target Date  06/22/18      PT LONG TERM GOAL #6   Title  Patient will improve her Quick Dash score to least 53% or less as a demonnstration of improved function.     Baseline  63% (12/12/2017); 79.5% (with interpreter's help, 01/31/2018); 65.9% (with interpreter's help)    Time  12    Period  Weeks    Status  On-going    Target Date  06/22/18      PT LONG TERM GOAL #7   Title  Patient will improve L UE strength to 3/5 or more MMT grade for L UE to promote ability to perform functional tasks.     Baseline  L shoulder  flexion 3-/5 with anterior shoulder pain, elbow flexion 3-/5, wrist extension 3-/5 (01/31/2018), (03/28/2018)    Time  12    Period  Weeks    Status  On-going    Target Date  06/22/18            Plan - 03/28/18 1121    Clinical Impression Statement  Pt demonstrates overall improved L UE strength and ability to raise her L arm up, overall decreased L neck, and L UE pain level at worst, overall improved ability to perform functional tasks at home (improved Quick Dash score since last progress report on 01/31/2018) since initial evaluation. Neck and L UE pain seems to respond well with treatment to decrease tension to L upper trap, scalene, posterior cervical paraspinal muscles and improving lower trap muscle activation. Pt overall making progress with PT towards goals. Challenges to progress include chronicity of condition, pain, and weakness. Patient will benefit from continued skilled physical therapy services to decrease pain, improve strength, and ability to use her L UE to perform functional tasks at home.  History and Personal Factors relevant to plan of care:  Chronicity of condition, multiple areas involved, language barrier, pain, weakness, difficulty using her L UE for tasks    Clinical Presentation  Stable    Clinical Presentation due to:  overall decreased pain since initial evaluation    Clinical Decision Making  Low    Rehab Potential  Fair    Clinical Impairments Affecting Rehab Potential  Chronicity of condition, multiple areas involved, language barrier, pain, weakness, difficulty using her L UE for tasks    PT Frequency  2x / week    PT Duration  12 weeks 11 visits over 8 weeks after pt returns from trip     PT Treatment/Interventions  Manual techniques;Patient/family education;Therapeutic activities;Therapeutic exercise;Passive range of motion;Dry needling;Neuromuscular re-education;Ultrasound;Electrical Stimulation;Iontophoresis 56m/ml Dexamethasone;Aquatic Therapy traction if  appropriate    PT Next Visit Plan  manual techniques, ROM, gentle strengthening, modalities as needed    Consulted and Agree with Plan of Care  Patient       Patient will benefit from skilled therapeutic intervention in order to improve the following deficits and impairments:  Pain, Postural dysfunction, Improper body mechanics, Impaired UE functional use, Decreased strength, Decreased range of motion  Visit Diagnosis: Radiculopathy, cervical region - Plan: PT plan of care cert/re-cert  Cervicalgia - Plan: PT plan of care cert/re-cert  Chronic left shoulder pain - Plan: PT plan of care cert/re-cert  Muscle weakness (generalized) - Plan: PT plan of care cert/re-cert  Pain in left arm - Plan: PT plan of care cert/re-cert  Pain in left elbow - Plan: PT plan of care cert/re-cert  Pain in left hand - Plan: PT plan of care cert/re-cert  Pain in left forearm - Plan: PT plan of care cert/re-cert     Problem List Patient Active Problem List   Diagnosis Date Noted  . Abnormal brain scan 02/15/2017  . Inflammation of shoulder joint 02/15/2017  . Neck pain 02/15/2017  . Hemochromatosis, hereditary ([place] 01/18/2017  . Hypertriglyceridemia 11/13/2016  . Blurry vision 05/24/2016  . Chronic tension-type headache, not intractable 05/24/2016  . Acute headache 04/19/2016  . Left arm pain 04/19/2016  . Rotator cuff tear 03/08/2016  . Carpal tunnel syndrome 03/08/2016  . Cervical radiculitis 03/08/2016    MJoneen BoersPT, DPT   03/28/2018, 7:38 PM  [place] [facility] [facility] [address] [place] [address]Phone: [phone]  Fax:  [phone] Name: DNalla PurdyMRN: [id number] of Birth: 411/13/1957

## 2018-04-03 ENCOUNTER — Ambulatory Visit: Payer: Medicaid Other

## 2018-05-02 ENCOUNTER — Ambulatory Visit: Payer: Medicaid Other | Attending: Neurology

## 2018-05-02 DIAGNOSIS — M5412 Radiculopathy, cervical region: Secondary | ICD-10-CM | POA: Insufficient documentation

## 2018-05-02 DIAGNOSIS — M79632 Pain in left forearm: Secondary | ICD-10-CM | POA: Diagnosis present

## 2018-05-02 DIAGNOSIS — M6281 Muscle weakness (generalized): Secondary | ICD-10-CM | POA: Insufficient documentation

## 2018-05-02 DIAGNOSIS — M25522 Pain in left elbow: Secondary | ICD-10-CM

## 2018-05-02 DIAGNOSIS — M542 Cervicalgia: Secondary | ICD-10-CM

## 2018-05-02 DIAGNOSIS — G8929 Other chronic pain: Secondary | ICD-10-CM | POA: Diagnosis present

## 2018-05-02 DIAGNOSIS — M25512 Pain in left shoulder: Secondary | ICD-10-CM | POA: Insufficient documentation

## 2018-05-02 DIAGNOSIS — M79642 Pain in left hand: Secondary | ICD-10-CM | POA: Diagnosis present

## 2018-05-02 DIAGNOSIS — M79602 Pain in left arm: Secondary | ICD-10-CM | POA: Insufficient documentation

## 2018-05-02 NOTE — Therapy (Signed)
[facility] [address], [place], [address] Phone: [phone]   Fax:  [phone]  Physical Therapy Treatment  Patient Details  Name: Tammy Lam MRN: [id number] Date of Birth: [birth year] Referring Provider: Gurney Maxin, MD   Encounter Date: 05/02/2018  PT End of Session - 05/02/18 0945    Visit Number  18    Number of Visits  28    Date for PT Re-Evaluation  06/22/18    Authorization Type  1 of 12    Authorization Time Period  Medicaid    PT Start Time  [phone]    PT Stop Time  1035    PT Time Calculation (min)  49 min    Activity Tolerance  Patient limited by pain    Behavior During Therapy  [facility] for tasks assessed/performed       Past Medical History:  Diagnosis Date  . Elevated ferritin   . Headache   . Hemochromatosis   . Hyperlipidemia   . Hyponatremia   . Medical history non-contributory   . Shoulder injury     Past Surgical History:  Procedure Laterality Date  . ANKLE SURGERY Right     There were no vitals filed for this visit.  Subjective Assessment - 05/02/18 0947    Subjective  Still has pain but less than before. 3/10 currently. 5/10 at most for the past 3 weeks while on the plane. Has been doing her HEP while away at [place].     Pertinent History  L arm and shoulder pain: 16 years ago when pt was at work, pt had to lift heavy items. Pt had electric pad treatment. Also went to PT and for some reason, she could not do exercises.   Pt could not move her arm.  2 years ago pt started taking medication.  1 month ago pt started taking injections.  Medication does help however the injection helps with the pain but also has pain in her elbow to her hand and the back of her neck.  Feels pain from elbow down her whole arm and hand. Feels a lot of pain on the palm of her L hand (pt observed to rub her hand a lot along the L C7/C8 dermatome on her hand).  Denies loss of bowel or bladder control. When  she moves her L hand, her pain also affects her head down to her feet on the L side.  Doctor just gave her injections, no mention of surgery.  Also has pain in the L side of her tongue, eye, face.  Pt states that the neurologist knows about it.  Does not think she can exercise her L UE due to pain.  Last PT was 15-16 years ago.   Pt is L hand dominant.     Patient Stated Goals  Pt expresses desire to get better.     Currently in Pain?  Yes    Pain Score  3     Pain Onset  More than a month ago                               PT Education - 05/02/18 1025    Education provided  Yes    Education Details  ther-ex    [employer]) Educated  Patient    Methods  Explanation;Demonstration;Tactile cues;Verbal cues    Comprehension  Returned demonstration;Verbalized understanding  Objectives   Interpreter Kim       Manual therapy  Seated STM bilateral UT muscles   Seated STM to distal L scalene and first rib area  Seated STM to posterior cervical paraspinal muscles      Therapeutic exercise  Seated cervical flexion gentle isometrics at neutral 5x5 seconds for 6 sets. Symptoms at top of her head which eases with rest  Seated bilateral scapular retraction 5x 6   Seated L scapular depression isometrics 10x5 seconds for 3 sets.  Seated L scapular retraction with gentle manual resistance targeting lower trap 10x5 seconds  Caudal pressure to L first rib area with L shoulder flexion 5x3with PT.     Increased time secondary to interpretation.  Improved exercise technique, movement at target joints, use of target muscles after min to mod verbal, visual, tactile cues.   Pt demonstrates very good carry over of decreased L neck and L UE pain from previous visit a month ago with pt reports of being compliant with her HEP.  Continued working on decreasing tension to L first rib, bilateral upper trap and posterior cervical  paraspinal muscle area to help decrease pressure to nerves coming from her L lateral neck. Added gentle anterior cervical strengthening today to help decrease posterior cervical muscle tension. Pt tolerated session well without aggravation of symptoms.    PT Long Term Goals - 03/28/18 1208      PT LONG TERM GOAL #1   Title  Pt will have a decrease in L cervical and L UE pain to 5/10 or less at worst to promote ability to use L UE to perform functional tasks.     Baseline  8/10 at worst. (10/25/2017), (12/12/2017); 4/10 at worst for the past 7 days (01/31/2018); 5/10 pain at worst for the past 7 days (03/28/2018)    Time  12    Period  Weeks    Status  Partially Met    Target Date  06/22/18      PT LONG TERM GOAL #2   Title  Pt will improve her L UE strength by at least 1/2 MMT grade to promote ability to perform functional tasks.     Baseline  Left: shoulder flexion 2+/5, elbow flexion 2+/5, wrist extension 2+/5 (10/25/2017); Not tested today secondary to pt stating pain everytime she lifts her L arm (12/12/2017); L shoulder flexion 3-/5 with anterior shoulder pain, elbow flexion 3-/5, wrist extension 3-/5 (01/31/2018)    Time  6    Period  Weeks    Status  Achieved      PT LONG TERM GOAL #3   Title  Patient will improve her FOTO score by at least 10 points as a demonstration of improved function.     Baseline  4 (filled out with husband's help 10/25/2017); 32 (with interpreter's help, 12/12/2017); 20 (with interpreter's help, 01/31/2018);  16 (with interpreter's help, 03/28/2018)    Time  4    Period  Weeks    Status  Achieved      PT LONG TERM GOAL #4   Title  Patient will improve her Quick Dash score by at least 10% as a demonnstration of improved function.     Baseline  88.6% (filled out with husband's help 10/25/2017); 63.6% (with interpreter's help 12/12/2017); 79.5% (with interpreter's help, 01/31/2018); 65.9% wiht interpreter's help (03/28/2018)    Time  9    Period  Weeks    Status  Achieved     Target Date  04/06/18  PT LONG TERM GOAL #5   Title  Patient will improve her FOTO score to at least 42 points as a demonstration of improved function.     Baseline  32 (12/12/2017); 20 (with interpreter's help, 01/31/2018); 16 (with interpreter's help)    Time  12    Period  Weeks    Status  On-going    Target Date  06/22/18      PT LONG TERM GOAL #6   Title  Patient will improve her Quick Dash score to least 53% or less as a demonnstration of improved function.     Baseline  63% (12/12/2017); 79.5% (with interpreter's help, 01/31/2018); 65.9% (with interpreter's help)    Time  12    Period  Weeks    Status  On-going    Target Date  06/22/18      PT LONG TERM GOAL #7   Title  Patient will improve L UE strength to 3/5 or more MMT grade for L UE to promote ability to perform functional tasks.     Baseline  L shoulder flexion 3-/5 with anterior shoulder pain, elbow flexion 3-/5, wrist extension 3-/5 (01/31/2018), (03/28/2018)    Time  12    Period  Weeks    Status  On-going    Target Date  06/22/18            Plan - 05/02/18 0941    Clinical Impression Statement  Pt demonstrates very good carry over of decreased L neck and L UE pain from previous visit a month ago with pt reports of being compliant with her HEP.  Continued working on decreasing tension to L first rib, bilateral upper trap and posterior cervical paraspinal muscle area to help decrease pressure to nerves coming from her L lateral neck. Added gentle anterior cervical strengthening today to help decrease posterior cervical muscle tension. Pt tolerated session well without aggravation of symptoms.     Rehab Potential  Fair    Clinical Impairments Affecting Rehab Potential  Chronicity of condition, multiple areas involved, language barrier, pain, weakness, difficulty using her L UE for tasks    PT Frequency  2x / week    PT Duration  12 weeks   11 visits over 8 weeks after pt returns from trip    PT  Treatment/Interventions  Manual techniques;Patient/family education;Therapeutic activities;Therapeutic exercise;Passive range of motion;Dry needling;Neuromuscular re-education;Ultrasound;Electrical Stimulation;Iontophoresis 46m/ml Dexamethasone;Aquatic Therapy   traction if appropriate   PT Next Visit Plan  manual techniques, ROM, gentle strengthening, modalities as needed    Consulted and Agree with Plan of Care  Patient       Patient will benefit from skilled therapeutic intervention in order to improve the following deficits and impairments:  Pain, Postural dysfunction, Improper body mechanics, Impaired UE functional use, Decreased strength, Decreased range of motion  Visit Diagnosis: Radiculopathy, cervical region  Cervicalgia  Chronic left shoulder pain  Muscle weakness (generalized)  Pain in left arm  Pain in left elbow  Pain in left hand  Pain in left forearm     Problem List Patient Active Problem List   Diagnosis Date Noted  . Abnormal brain scan 02/15/2017  . Inflammation of shoulder joint 02/15/2017  . Neck pain 02/15/2017  . Hemochromatosis, hereditary ([place] 01/18/2017  . Hypertriglyceridemia 11/13/2016  . Blurry vision 05/24/2016  . Chronic tension-type headache, not intractable 05/24/2016  . Acute headache 04/19/2016  . Left arm pain 04/19/2016  . Rotator cuff tear 03/08/2016  . Carpal tunnel syndrome 03/08/2016  .  Cervical radiculitis 03/08/2016    Joneen Boers PT, DPT   05/02/2018, 10:43 AM  [facility] [address] [place], [address] Phone: [phone]   Fax:  [phone]  Name: Alaylah Heatherington MRN: [id number] Date of Birth: [birth year]

## 2018-05-04 ENCOUNTER — Ambulatory Visit: Payer: Medicaid Other

## 2018-05-04 DIAGNOSIS — M25512 Pain in left shoulder: Principal | ICD-10-CM

## 2018-05-04 DIAGNOSIS — M5412 Radiculopathy, cervical region: Secondary | ICD-10-CM

## 2018-05-04 DIAGNOSIS — M6281 Muscle weakness (generalized): Secondary | ICD-10-CM

## 2018-05-04 DIAGNOSIS — G8929 Other chronic pain: Secondary | ICD-10-CM

## 2018-05-04 NOTE — Therapy (Signed)
[facility] [address] [place], [address] Phone: [phone]   Fax:  [phone]  Physical Therapy Treatment  Patient Details  Name: Tammy Lam MRN: [id number] Date of Birth: Feb 21, 1956 Referring Provider: Gurney Maxin, MD   Encounter Date: 05/04/2018  PT End of Session - 05/04/18 0946    Visit Number  19    Number of Visits  28    Date for PT Re-Evaluation  06/22/18    Authorization Type  2 of 12    Authorization Time Period  Medicaid    PT Start Time  0901    PT Stop Time  0942    PT Time Calculation (min)  41 min    Activity Tolerance  Patient limited by pain    Behavior During Therapy  [facility] for tasks assessed/performed       Past Medical History:  Diagnosis Date  . Elevated ferritin   . Headache   . Hemochromatosis   . Hyperlipidemia   . Hyponatremia   . Medical history non-contributory   . Shoulder injury     Past Surgical History:  Procedure Laterality Date  . ANKLE SURGERY Right     There were no vitals filed for this visit.  Subjective Assessment - 05/04/18 0901    Subjective  Still has pain but improving. She rates pain as 3-5/10 regularly. She has been performing HEP at home and they are going well.     Pertinent History  L arm and shoulder pain: 16 years ago when pt was at work, pt had to lift heavy items. Pt had electric pad treatment. Also went to PT and for some reason, she could not do exercises.   Pt could not move her arm.  2 years ago pt started taking medication.  1 month ago pt started taking injections.  Medication does help however the injection helps with the pain but also has pain in her elbow to her hand and the back of her neck.  Feels pain from elbow down her whole arm and hand. Feels a lot of pain on the palm of her L hand (pt observed to rub her hand a lot along the L C7/C8 dermatome on her hand).  Denies loss of bowel or bladder control. When she moves her L hand, her  pain also affects her head down to her feet on the L side.  Doctor just gave her injections, no mention of surgery.  Also has pain in the L side of her tongue, eye, face.  Pt states that the neurologist knows about it.  Does not think she can exercise her L UE due to pain.  Last PT was 15-16 years ago.   Pt is L hand dominant.     Patient Stated Goals  Pt expresses desire to get better.     Currently in Pain?  Yes    Pain Score  5     Pain Location  --   L neck and LUE   Pain Orientation  Left    Pain Type  Chronic pain    Pain Onset  More than a month ago    Pain Frequency  Constant       TREATMENT  Manual therapy Seated STM bilateral UT muscles  Seated STM to distal L scalene and first rib area Seated STM to posterior cervical paraspinal muscles Seated upper trap and scalene stretches 30s hold x 2 each bilateral; L first rib glides grade  I, 30s/bout x 3 bouts;  Therapeutic exercise Seated cervical isometrics at neutral for protraction, retraction, lateral flexion, and rotation bilaterally, 5s hold x 10 each; Seated bilateral scapular retraction with depression 5s hold x 10;  Isometric L serratus strengthening 5s hold x 10; Supine over 1/2 foam roll L pec major clavicular portion stretch 30s hold x 2; Seated gentle repeated thoracic extension over towel roll x 10;  Improved exercise technique, movement at target joints, use of target muscles after min to mod verbal, visual, tactile cues.  Pt reports significant improvement in L neck and LUE pain at end of session but does not rate on NPRS. She also reports she is able to open her L eye better which becomes difficult when she is having a lot of pain. She is able to perform all exercises as instructed today by therapist without any increase in pain. Continued working on decreasing tension to L first rib, bilateral upper trap and posterior cervical paraspinal muscle area to help decrease pressure to nerves coming from her L lateral  neck. Pt tolerated session well without aggravation of symptoms.                       PT Education - 05/04/18 0904    Education provided  Yes    Education Details  exercise form/technique    Person(s) Educated  Patient    Methods  Explanation    Comprehension  Verbalized understanding          PT Long Term Goals - 03/28/18 1208      PT LONG TERM GOAL #1   Title  Pt will have a decrease in L cervical and L UE pain to 5/10 or less at worst to promote ability to use L UE to perform functional tasks.     Baseline  8/10 at worst. (10/25/2017), (12/12/2017); 4/10 at worst for the past 7 days (01/31/2018); 5/10 pain at worst for the past 7 days (03/28/2018)    Time  12    Period  Weeks    Status  Partially Met    Target Date  06/22/18      PT LONG TERM GOAL #2   Title  Pt will improve her L UE strength by at least 1/2 MMT grade to promote ability to perform functional tasks.     Baseline  Left: shoulder flexion 2+/5, elbow flexion 2+/5, wrist extension 2+/5 (10/25/2017); Not tested today secondary to pt stating pain everytime she lifts her L arm (12/12/2017); L shoulder flexion 3-/5 with anterior shoulder pain, elbow flexion 3-/5, wrist extension 3-/5 (01/31/2018)    Time  6    Period  Weeks    Status  Achieved      PT LONG TERM GOAL #3   Title  Patient will improve her FOTO score by at least 10 points as a demonstration of improved function.     Baseline  4 (filled out with husband's help 10/25/2017); 32 (with interpreter's help, 12/12/2017); 20 (with interpreter's help, 01/31/2018);  16 (with interpreter's help, 03/28/2018)    Time  4    Period  Weeks    Status  Achieved      PT LONG TERM GOAL #4   Title  Patient will improve her Quick Dash score by at least 10% as a demonnstration of improved function.     Baseline  88.6% (filled out with husband's help 10/25/2017); 63.6% (with interpreter's help 12/12/2017); 79.5% (with interpreter's help, 01/31/2018); 65.9% wiht  interpreter's  help (03/28/2018)    Time  9    Period  Weeks    Status  Achieved    Target Date  04/06/18      PT LONG TERM GOAL #5   Title  Patient will improve her FOTO score to at least 42 points as a demonstration of improved function.     Baseline  32 (12/12/2017); 20 (with interpreter's help, 01/31/2018); 16 (with interpreter's help)    Time  12    Period  Weeks    Status  On-going    Target Date  06/22/18      PT LONG TERM GOAL #6   Title  Patient will improve her Quick Dash score to least 53% or less as a demonnstration of improved function.     Baseline  63% (12/12/2017); 79.5% (with interpreter's help, 01/31/2018); 65.9% (with interpreter's help)    Time  12    Period  Weeks    Status  On-going    Target Date  06/22/18      PT LONG TERM GOAL #7   Title  Patient will improve L UE strength to 3/5 or more MMT grade for L UE to promote ability to perform functional tasks.     Baseline  L shoulder flexion 3-/5 with anterior shoulder pain, elbow flexion 3-/5, wrist extension 3-/5 (01/31/2018), (03/28/2018)    Time  12    Period  Weeks    Status  On-going    Target Date  06/22/18            Plan - 05/04/18 0948    Clinical Impression Statement  Pt reports significant improvement in L neck and LUE pain at end of session but does not rate on NPRS. She also reports she is able to open her L eye better which becomes difficult when she is having a lot of pain. She is able to perform all exercises as instructed today by therapist without any increase in pain. Continued working on decreasing tension to L first rib, bilateral upper trap and posterior cervical paraspinal muscle area to help decrease pressure to nerves coming from her L lateral neck. Pt tolerated session well without aggravation of symptoms.     Clinical Decision Making  Low    Rehab Potential  Fair    Clinical Impairments Affecting Rehab Potential  Chronicity of condition, multiple areas involved, language barrier, pain, weakness,  difficulty using her L UE for tasks    PT Frequency  2x / week    PT Duration  12 weeks   11 visits over 8 weeks after pt returns from trip    PT Treatment/Interventions  Manual techniques;Patient/family education;Therapeutic activities;Therapeutic exercise;Passive range of motion;Dry needling;Neuromuscular re-education;Ultrasound;Electrical Stimulation;Iontophoresis 104m/ml Dexamethasone;Aquatic Therapy   traction if appropriate   PT Next Visit Plan  manual techniques, ROM, gentle strengthening, modalities as needed    Consulted and Agree with Plan of Care  Patient       Patient will benefit from skilled therapeutic intervention in order to improve the following deficits and impairments:  Pain, Postural dysfunction, Improper body mechanics, Impaired UE functional use, Decreased strength, Decreased range of motion  Visit Diagnosis: Chronic left shoulder pain  Muscle weakness (generalized)  Radiculopathy, cervical region     Problem List Patient Active Problem List   Diagnosis Date Noted  . Abnormal brain scan 02/15/2017  . Inflammation of shoulder joint 02/15/2017  . Neck pain 02/15/2017  . Hem[place]atosis, hereditary ([place] 01/18/2017  . Hypertriglyceridemia 11/13/2016  .  Blurry vision 05/24/2016  . Chronic tension-type headache, not intractable 05/24/2016  . Acute headache 04/19/2016  . Left arm pain 04/19/2016  . Rotator cuff tear 03/08/2016  . Carpal tunnel syndrome 03/08/2016  . Cervical radiculitis 03/08/2016   Lyndel Safe Huprich PT, DPT, GCS  Huprich,Jason 05/04/2018, 9:50 AM  [facility] [address] [place], [address] Phone: [phone]   Fax:  [phone]  Name: Tammy Lam MRN: [id number] Date of Birth: Sep 05, 1955

## 2018-05-09 ENCOUNTER — Ambulatory Visit: Payer: Medicaid Other

## 2018-05-09 DIAGNOSIS — M25512 Pain in left shoulder: Principal | ICD-10-CM

## 2018-05-09 DIAGNOSIS — M79602 Pain in left arm: Secondary | ICD-10-CM

## 2018-05-09 DIAGNOSIS — M6281 Muscle weakness (generalized): Secondary | ICD-10-CM

## 2018-05-09 DIAGNOSIS — M542 Cervicalgia: Secondary | ICD-10-CM

## 2018-05-09 DIAGNOSIS — M5412 Radiculopathy, cervical region: Secondary | ICD-10-CM

## 2018-05-09 DIAGNOSIS — M25522 Pain in left elbow: Secondary | ICD-10-CM

## 2018-05-09 DIAGNOSIS — G8929 Other chronic pain: Secondary | ICD-10-CM

## 2018-05-09 DIAGNOSIS — M79642 Pain in left hand: Secondary | ICD-10-CM

## 2018-05-09 DIAGNOSIS — M79632 Pain in left forearm: Secondary | ICD-10-CM

## 2018-05-09 NOTE — Therapy (Signed)
[facility] [address] [place], [address] Phone: [phone]   Fax:  [phone]  Physical Therapy Treatment  Patient Details  Name: Tammy Lam MRN: [id number] Date of Birth: 1956-07-18 Referring Provider: Gurney Maxin, MD   Encounter Date: 05/09/2018  PT End of Session - 05/09/18 1120    Visit Number  20    Number of Visits  28    Date for PT Re-Evaluation  06/22/18    Authorization Type  3 of 12    Authorization Time Period  Medicaid    PT Start Time  1120    PT Stop Time  1200    PT Time Calculation (min)  40 min    Activity Tolerance  Patient limited by pain    Behavior During Therapy  [facility] for tasks assessed/performed       Past Medical History:  Diagnosis Date  . Elevated ferritin   . Headache   . Hemochromatosis   . Hyperlipidemia   . Hyponatremia   . Medical history non-contributory   . Shoulder injury     Past Surgical History:  Procedure Laterality Date  . ANKLE SURGERY Right     There were no vitals filed for this visit.  Subjective Assessment - 05/09/18 1122    Subjective  L neck and UE is better now. 3.5/10 currently. Did good after last session.    Pertinent History  L arm and shoulder pain: 16 years ago when pt was at work, pt had to lift heavy items. Pt had electric pad treatment. Also went to PT and for some reason, she could not do exercises.   Pt could not move her arm.  2 years ago pt started taking medication.  1 month ago pt started taking injections.  Medication does help however the injection helps with the pain but also has pain in her elbow to her hand and the back of her neck.  Feels pain from elbow down her whole arm and hand. Feels a lot of pain on the palm of her L hand (pt observed to rub her hand a lot along the L C7/C8 dermatome on her hand).  Denies loss of bowel or bladder control. When she moves her L hand, her pain also affects her head down to her feet on the  L side.  Doctor just gave her injections, no mention of surgery.  Also has pain in the L side of her tongue, eye, face.  Pt states that the neurologist knows about it.  Does not think she can exercise her L UE due to pain.  Last PT was 15-16 years ago.   Pt is L hand dominant.     Patient Stated Goals  Pt expresses desire to get better.     Currently in Pain?  Yes    Pain Score  4    3.5/10   Pain Onset  More than a month ago                               PT Education - 05/09/18 1149    Education provided  Yes    Education Details  ther-ex    [employer]) Educated  Patient    Methods  Explanation;Demonstration;Tactile cues;Verbal cues    Comprehension  Verbalized understanding;Returned demonstration      Objectives  Interpreter  present [place] [phone])    Manual therapy  Seated  STM L UT/rhomboid muscles Seated STM to distal L scalene and first rib area Seated STM to posterior cervical paraspinal muscles   Therapeutic exercise Seated cervical isometrics at neutral for flexion, retraction, lateral flexion, and rotation bilaterally, 5s hold x 10 each;  Seated gentle repeated thoracic extension over towel roll x 10 with 5 second holds  Seated gentle chin tucks 5x5 seconds. Slight fatigue at top of her head which eases with rest  Seated bilateral scapular retraction 5s hold x 10 for 3 sets     Improved exercise technique, movement at target joints, use of target muscles after min to mod verbal, visual, tactile cues.  Continued working on decreasing tension around her neck on L side, improving cervical strength, thoracic extension, and scapular strengthening. Improving overall exercise tolerance observed. Pt tolerated session well without aggravation of symptoms.     PT Long Term Goals - 03/28/18 1208      PT LONG TERM GOAL #1   Title  Pt will have a decrease in L cervical and L UE pain to 5/10 or less at worst to promote ability to use  L UE to perform functional tasks.     Baseline  8/10 at worst. (10/25/2017), (12/12/2017); 4/10 at worst for the past 7 days (01/31/2018); 5/10 pain at worst for the past 7 days (03/28/2018)    Time  12    Period  Weeks    Status  Partially Met    Target Date  06/22/18      PT LONG TERM GOAL #2   Title  Pt will improve her L UE strength by at least 1/2 MMT grade to promote ability to perform functional tasks.     Baseline  Left: shoulder flexion 2+/5, elbow flexion 2+/5, wrist extension 2+/5 (10/25/2017); Not tested today secondary to pt stating pain everytime she lifts her L arm (12/12/2017); L shoulder flexion 3-/5 with anterior shoulder pain, elbow flexion 3-/5, wrist extension 3-/5 (01/31/2018)    Time  6    Period  Weeks    Status  Achieved      PT LONG TERM GOAL #3   Title  Patient will improve her FOTO score by at least 10 points as a demonstration of improved function.     Baseline  4 (filled out with husband's help 10/25/2017); 32 (with interpreter's help, 12/12/2017); 20 (with interpreter's help, 01/31/2018);  16 (with interpreter's help, 03/28/2018)    Time  4    Period  Weeks    Status  Achieved      PT LONG TERM GOAL #4   Title  Patient will improve her Quick Dash score by at least 10% as a demonnstration of improved function.     Baseline  88.6% (filled out with husband's help 10/25/2017); 63.6% (with interpreter's help 12/12/2017); 79.5% (with interpreter's help, 01/31/2018); 65.9% wiht interpreter's help (03/28/2018)    Time  9    Period  Weeks    Status  Achieved    Target Date  04/06/18      PT LONG TERM GOAL #5   Title  Patient will improve her FOTO score to at least 42 points as a demonstration of improved function.     Baseline  32 (12/12/2017); 20 (with interpreter's help, 01/31/2018); 16 (with interpreter's help)    Time  12    Period  Weeks    Status  On-going    Target Date  06/22/18      PT LONG TERM GOAL #6  Title  Patient will improve her Quick Dash score to least 53% or  less as a demonnstration of improved function.     Baseline  63% (12/12/2017); 79.5% (with interpreter's help, 01/31/2018); 65.9% (with interpreter's help)    Time  12    Period  Weeks    Status  On-going    Target Date  06/22/18      PT LONG TERM GOAL #7   Title  Patient will improve L UE strength to 3/5 or more MMT grade for L UE to promote ability to perform functional tasks.     Baseline  L shoulder flexion 3-/5 with anterior shoulder pain, elbow flexion 3-/5, wrist extension 3-/5 (01/31/2018), (03/28/2018)    Time  12    Period  Weeks    Status  On-going    Target Date  06/22/18            Plan - 05/09/18 1119    Clinical Impression Statement  Continued working on decreasing tension around her neck on L side, improving cervical strength, thoracic extension, and scapular strengthening. Improving overall exercise tolerance observed. Pt tolerated session well without aggravation of symptoms.     Rehab Potential  Fair    Clinical Impairments Affecting Rehab Potential  Chronicity of condition, multiple areas involved, language barrier, pain, weakness, difficulty using her L UE for tasks    PT Frequency  2x / week    PT Duration  12 weeks   11 visits over 8 weeks after pt returns from trip    PT Treatment/Interventions  Manual techniques;Patient/family education;Therapeutic activities;Therapeutic exercise;Passive range of motion;Dry needling;Neuromuscular re-education;Ultrasound;Electrical Stimulation;Iontophoresis 63m/ml Dexamethasone;Aquatic Therapy   traction if appropriate   PT Next Visit Plan  manual techniques, ROM, gentle strengthening, modalities as needed    Consulted and Agree with Plan of Care  Patient       Patient will benefit from skilled therapeutic intervention in order to improve the following deficits and impairments:  Pain, Postural dysfunction, Improper body mechanics, Impaired UE functional use, Decreased strength, Decreased range of motion  Visit  Diagnosis: Chronic left shoulder pain  Muscle weakness (generalized)  Radiculopathy, cervical region  Cervicalgia  Pain in left arm  Pain in left elbow  Pain in left hand  Pain in left forearm     Problem List Patient Active Problem List   Diagnosis Date Noted  . Abnormal brain scan 02/15/2017  . Inflammation of shoulder joint 02/15/2017  . Neck pain 02/15/2017  . Hemochromatosis, hereditary ([place] 01/18/2017  . Hypertriglyceridemia 11/13/2016  . Blurry vision 05/24/2016  . Chronic tension-type headache, not intractable 05/24/2016  . Acute headache 04/19/2016  . Left arm pain 04/19/2016  . Rotator cuff tear 03/08/2016  . Carpal tunnel syndrome 03/08/2016  . Cervical radiculitis 03/08/2016    MJoneen BoersPT, DPT   05/09/2018, 12:09 PM  [facility] [address] [place] [address]Phone: [phone]  Fax:  [phone] Name: Tammy FiebelkornMRN: [id number] of Birth: [birth year]

## 2018-05-11 ENCOUNTER — Ambulatory Visit: Payer: Medicaid Other

## 2018-05-11 DIAGNOSIS — M25512 Pain in left shoulder: Secondary | ICD-10-CM

## 2018-05-11 DIAGNOSIS — M6281 Muscle weakness (generalized): Secondary | ICD-10-CM

## 2018-05-11 DIAGNOSIS — M5412 Radiculopathy, cervical region: Secondary | ICD-10-CM

## 2018-05-11 DIAGNOSIS — M79632 Pain in left forearm: Secondary | ICD-10-CM

## 2018-05-11 DIAGNOSIS — M79642 Pain in left hand: Secondary | ICD-10-CM

## 2018-05-11 DIAGNOSIS — M25522 Pain in left elbow: Secondary | ICD-10-CM

## 2018-05-11 DIAGNOSIS — G8929 Other chronic pain: Secondary | ICD-10-CM

## 2018-05-11 DIAGNOSIS — M79602 Pain in left arm: Secondary | ICD-10-CM

## 2018-05-11 DIAGNOSIS — M542 Cervicalgia: Secondary | ICD-10-CM

## 2018-05-11 NOTE — Therapy (Signed)
[facility] [address], [place], [address] Phone: [phone]   Fax:  [phone]  Physical Therapy Treatment  Patient Details  Name: Tammy Lam MRN: [id number] Date of Birth: 27-Dec-1955 Referring Provider: Gurney Maxin, MD   Encounter Date: 05/11/2018  PT End of Session - 05/11/18 0859    Visit Number  21    Number of Visits  28    Date for PT Re-Evaluation  06/22/18    Authorization Type  4 of 12    Authorization Time Period  Medicaid    PT Start Time  [phone]    PT Stop Time  0944    PT Time Calculation (min)  45 min    Activity Tolerance  Patient limited by pain    Behavior During Therapy  [facility] for tasks assessed/performed       Past Medical History:  Diagnosis Date  . Elevated ferritin   . Headache   . Hemochromatosis   . Hyperlipidemia   . Hyponatremia   . Medical history non-contributory   . Shoulder injury     Past Surgical History:  Procedure Laterality Date  . ANKLE SURGERY Right     There were no vitals filed for this visit.  Subjective Assessment - 05/11/18 0901    Subjective  L neck and L UE bothers her due to colder temperature. 4/10 currently.     Pertinent History  L arm and shoulder pain: 16 years ago when pt was at work, pt had to lift heavy items. Pt had electric pad treatment. Also went to PT and for some reason, she could not do exercises.   Pt could not move her arm.  2 years ago pt started taking medication.  1 month ago pt started taking injections.  Medication does help however the injection helps with the pain but also has pain in her elbow to her hand and the back of her neck.  Feels pain from elbow down her whole arm and hand. Feels a lot of pain on the palm of her L hand (pt observed to rub her hand a lot along the L C7/C8 dermatome on her hand).  Denies loss of bowel or bladder control. When she moves her L hand, her pain also affects her head down to her feet on the L  side.  Doctor just gave her injections, no mention of surgery.  Also has pain in the L side of her tongue, eye, face.  Pt states that the neurologist knows about it.  Does not think she can exercise her L UE due to pain.  Last PT was 15-16 years ago.   Pt is L hand dominant.     Patient Stated Goals  Pt expresses desire to get better.     Currently in Pain?  Yes    Pain Score  4     Pain Onset  More than a month ago                               PT Education - 05/11/18 0932    Education provided  Yes    Education Details  ther-ex    [employer]) Educated  Patient    Methods  Explanation;Demonstration;Tactile cues;Verbal cues    Comprehension  Returned demonstration;Verbalized understanding         Objectives  Interpreter  present Tammy Lam     Manual therapy  Seated  STM around C4/5 area. Decreased pain to 3/10   Increased time spent on area today Seated STM to distal L scalene and first rib area Seated STM B UT muscles  Therapeutic exercise  Supine position with towel roll behind neck x 2 min to promote gentle cervical extension   Then with bilateral scapular retraction 5x4. Decreased symptoms   Then with bilateral shoulder flexion 1x. L anterior lateral shoulder and arm pain  Seated gentle thoracic extension over towel roll x 10 with 5 second holds for 2 sets  Give as part of HEP next visit if appropriate   Seated cervicalisometrics at neutralfor flexion, retraction, lateral flexion, and rotation bilaterally, 5s hold x 10 each;     Improved exercise technique, movement at target joints, use of target muscles after min to mod verbal, visual, tactile cues.  Continued working on decreasing tension to cervical paraspinal, B upper trap, and L first rib area, as well as promoting gentle/comfortable extension to mid cervical spine to help decrease pain. Pt tolerated session well without aggravation of symptoms. Pt will benefit from continued  skilled physical therapy services to decrease pain and improve function.     PT Long Term Goals - 03/28/18 1208      PT LONG TERM GOAL #1   Title  Pt will have a decrease in L cervical and L UE pain to 5/10 or less at worst to promote ability to use L UE to perform functional tasks.     Baseline  8/10 at worst. (10/25/2017), (12/12/2017); 4/10 at worst for the past 7 days (01/31/2018); 5/10 pain at worst for the past 7 days (03/28/2018)    Time  12    Period  Weeks    Status  Partially Met    Target Date  06/22/18      PT LONG TERM GOAL #2   Title  Pt will improve her L UE strength by at least 1/2 MMT grade to promote ability to perform functional tasks.     Baseline  Left: shoulder flexion 2+/5, elbow flexion 2+/5, wrist extension 2+/5 (10/25/2017); Not tested today secondary to pt stating pain everytime she lifts her L arm (12/12/2017); L shoulder flexion 3-/5 with anterior shoulder pain, elbow flexion 3-/5, wrist extension 3-/5 (01/31/2018)    Time  6    Period  Weeks    Status  Achieved      PT LONG TERM GOAL #3   Title  Patient will improve her FOTO score by at least 10 points as a demonstration of improved function.     Baseline  4 (filled out with husband's help 10/25/2017); 32 (with interpreter's help, 12/12/2017); 20 (with interpreter's help, 01/31/2018);  16 (with interpreter's help, 03/28/2018)    Time  4    Period  Weeks    Status  Achieved      PT LONG TERM GOAL #4   Title  Patient will improve her Quick Dash score by at least 10% as a demonnstration of improved function.     Baseline  88.6% (filled out with husband's help 10/25/2017); 63.6% (with interpreter's help 12/12/2017); 79.5% (with interpreter's help, 01/31/2018); 65.9% wiht interpreter's help (03/28/2018)    Time  9    Period  Weeks    Status  Achieved    Target Date  04/06/18      PT LONG TERM GOAL #5   Title  Patient will improve her FOTO score to at least 42 points as a demonstration of  improved function.     Baseline  32  (12/12/2017); 20 (with interpreter's help, 01/31/2018); 16 (with interpreter's help)    Time  12    Period  Weeks    Status  On-going    Target Date  06/22/18      PT LONG TERM GOAL #6   Title  Patient will improve her Quick Dash score to least 53% or less as a demonnstration of improved function.     Baseline  63% (12/12/2017); 79.5% (with interpreter's help, 01/31/2018); 65.9% (with interpreter's help)    Time  12    Period  Weeks    Status  On-going    Target Date  06/22/18      PT LONG TERM GOAL #7   Title  Patient will improve L UE strength to 3/5 or more MMT grade for L UE to promote ability to perform functional tasks.     Baseline  L shoulder flexion 3-/5 with anterior shoulder pain, elbow flexion 3-/5, wrist extension 3-/5 (01/31/2018), (03/28/2018)    Time  12    Period  Weeks    Status  On-going    Target Date  06/22/18            Plan - 05/11/18 0933    Clinical Impression Statement  Continued working on decreasing tension to cervical paraspinal, B upper trap, and L first rib area, as well as promoting gentle/comfortable extension to mid cervical spine to help decrease pain. Pt tolerated session well without aggravation of symptoms. Pt will benefit from continued skilled physical therapy services to decrease pain and improve function.     Rehab Potential  Fair    Clinical Impairments Affecting Rehab Potential  Chronicity of condition, multiple areas involved, language barrier, pain, weakness, difficulty using her L UE for tasks    PT Frequency  2x / week    PT Duration  12 weeks   11 visits over 8 weeks after pt returns from trip    PT Treatment/Interventions  Manual techniques;Patient/family education;Therapeutic activities;Therapeutic exercise;Passive range of motion;Dry needling;Neuromuscular re-education;Ultrasound;Electrical Stimulation;Iontophoresis 25m/ml Dexamethasone;Aquatic Therapy   traction if appropriate   PT Next Visit Plan  manual techniques, ROM, gentle  strengthening, modalities as needed    Consulted and Agree with Plan of Care  Patient       Patient will benefit from skilled therapeutic intervention in order to improve the following deficits and impairments:  Pain, Postural dysfunction, Improper body mechanics, Impaired UE functional use, Decreased strength, Decreased range of motion  Visit Diagnosis: Muscle weakness (generalized)  Radiculopathy, cervical region  Cervicalgia  Pain in left arm  Pain in left elbow  Chronic left shoulder pain  Pain in left hand  Pain in left forearm     Problem List Patient Active Problem List   Diagnosis Date Noted  . Abnormal brain scan 02/15/2017  . Inflammation of shoulder joint 02/15/2017  . Neck pain 02/15/2017  . Hem[place]atosis, [place] ([place] 01/18/2017  . Hypertriglyceridemia 11/13/2016  . Blurry vision 05/24/2016  . Chronic tension-type headache, not intractable 05/24/2016  . Acute headache 04/19/2016  . Left arm pain 04/19/2016  . Rotator cuff tear 03/08/2016  . Carpal tunnel syndrome 03/08/2016  . Cervical radiculitis 03/08/2016    Tammy BoersPT, DPT   05/11/2018, 11:39 AM  [facility] [address] [place] [address]Phone: [phone]  Fax:  [phone] Name: DGustie BobbMRN: [id number] of Birth: [birth year]

## 2018-05-12 ENCOUNTER — Ambulatory Visit: Payer: Medicaid Other | Admitting: Adult Health

## 2018-05-12 ENCOUNTER — Encounter: Payer: Self-pay | Admitting: Adult Health

## 2018-05-12 VITALS — BP 122/92 | HR 69 | Temp 98.8°F | Resp 16 | Ht 65.0 in | Wt 137.0 lb

## 2018-05-12 DIAGNOSIS — R1013 Epigastric pain: Secondary | ICD-10-CM | POA: Diagnosis not present

## 2018-05-12 DIAGNOSIS — R6881 Early satiety: Secondary | ICD-10-CM

## 2018-05-12 NOTE — Patient Instructions (Signed)
[facility], Care After Refer to this sheet in the next few weeks. These instructions provide you with information about caring for yourself after your procedure. Your health care provider may also give you more specific instructions. Your treatment has been planned according to current medical practices, but problems sometimes occur. Call your health care provider if you have any problems or questions after your procedure. What can I expect after the procedure? For a few days after your procedure, it is common to have stools (feces) that are white or light-colored. Your stools will return to normal color after all of the barium has passed out of your body in your stool. Follow these instructions at home:  Return to your normal activities and your normal diet as told by your health care provider.  Check your stools to make sure that they return to normal color within a few days.  Follow instructions from your health care provider about how to prevent constipation and how to help remove the barium from your body. You may be told to take these actions: ? Drink enough fluid to keep your urine clear or pale yellow. ? Eat foods that are high in fiber, such as fruits, vegetables, whole grains, and beans. ? Take a laxative as told by your health care provider. Contact a health care provider if:  You are constipated for more than 2 days.  Your stools still look white or chalky after 3 days.  You have cramps, pain, or diarrhea.  You have swelling of your abdomen.  You feel nauseous or you vomit.  You have a fever. Get help right away if:  You cannot pass gas.  You have very bad constipation.  You have abdominal pain that gets worse.  You develop red, itchy hives on your skin.  Your throat swells.  You have trouble breathing.  Your abdomen is very hard and bloated (distended). This information is not intended to replace advice given to you by your health care  provider. Make sure you discuss any questions you have with your health care provider. Document Released: 07/29/2011 Document Revised: 04/11/2016 Document Reviewed: 02/21/2015 Elsevier Interactive Patient Education  [employer]2018 [employer]

## 2018-05-12 NOTE — Progress Notes (Signed)
[facility] [address] [place], [place] [address]  Internal MEDICINE  Office Visit Note  Patient Name: Tammy Lam  [id number]  [id number]  Date of Service: 05/18/2018  Chief Complaint  Patient presents with  . Abdominal Pain    right side right below the breast from center towards the right, shooting pain    HPI Pt is here for a sick visit. Husband in exam room.  Pt is [place] speaking, language barrier present. Pt here for epigastric pain x 2 weeks, that is radiating around her right side under her breast.  She reports a similarly episode approximately 8 months ago.  That episode was less severe and she noticed a hard, painful area in the epigastric region. She reports when she eats, she gets full very quickly.  She reports laying down and sitting up makes the pain worse.  She denies other aggravating, or relieving factors.  She denies NSAID use. Denies blood in stool, nausea,vomiting or diarrhea. She reports being able to feel something under the skin at base of xyphoid process, she reports it is bigger now that it was.  She describes it as hard, non moving.  I did not appreciate this on exam.    Current Medication:  Outpatient Encounter Medications as of 05/12/2018  Medication Sig  . diclofenac sodium (VOLTAREN) 1 % GEL Apply topically 4 (four) times daily.  . fenofibrate (TRICOR) 145 MG tablet Take 1 tablet by mouth daily.  Marland Kitchen gabapentin (NEURONTIN) 100 MG capsule Take 1 capsule by mouth 4 (four) times daily.  Marland Kitchen ibuprofen (ADVIL,MOTRIN) 600 MG tablet Take by mouth.  . nortriptyline (PAMELOR) 10 MG capsule Take 10 mg by mouth at bedtime.  . [DISCONTINUED] meloxicam (MOBIC) 15 MG tablet TAKE ONE TAB IN AM AFTER BREAKFAST (Patient not taking: Reported on 03/02/2018)   No facility-administered encounter medications on file as of 05/12/2018.       Medical History: Past Medical History:  Diagnosis Date  . Elevated ferritin   . Headache   . Hemochromatosis   .  Hyperlipidemia   . Hyponatremia   . Medical history non-contributory   . Shoulder injury    Vital Signs: BP (!) 122/92   Pulse 69   Temp 98.8 F (37.1 C)   Resp 16   Ht 5\' 5"  (1.651 m)   Wt 137 lb (62.1 kg)   LMP  (LMP Unknown)   SpO2 98%   BMI 22.80 kg/m   Review of Systems  Constitutional: Negative for chills, fatigue and unexpected weight change.  HENT: Negative for congestion, rhinorrhea, sneezing and sore throat.   Eyes: Negative for photophobia, pain and redness.  Respiratory: Negative for cough, chest tightness and shortness of breath.   Cardiovascular: Negative for chest pain and palpitations.  Gastrointestinal: Positive for abdominal pain. Negative for constipation, diarrhea, nausea and vomiting.       Epigastric abdominal pain, radiates circumferentially under right breast.   Endocrine: Negative.   Genitourinary: Negative for dysuria and frequency.  Musculoskeletal: Negative for arthralgias, back pain, joint swelling and neck pain.  Skin: Negative for rash.  Allergic/Immunologic: Negative.   Neurological: Negative for tremors and numbness.  Hematological: Negative for adenopathy. Does not bruise/bleed easily.  Psychiatric/Behavioral: Negative for behavioral problems and sleep disturbance. The patient is not nervous/anxious.     Physical Exam  Constitutional: She is oriented to person, place, and time. She appears well-developed and well-nourished. No distress.  HENT:  Head: Normocephalic and atraumatic.  Mouth/Throat: Oropharynx is clear and  moist. No oropharyngeal exudate.  Eyes: Pupils are equal, round, and reactive to light. EOM are normal.  Neck: Normal range of motion. Neck supple. No JVD present. No tracheal deviation present. No thyromegaly present.  Cardiovascular: Normal rate, regular rhythm and normal heart sounds. Exam reveals no gallop and no friction rub.  No murmur heard. Pulmonary/Chest: Effort normal and breath sounds normal. No respiratory  distress. She has no wheezes. She has no rales. She exhibits no tenderness.  Abdominal: Soft. There is no tenderness. There is no guarding.  Musculoskeletal: Normal range of motion.  Lymphadenopathy:    She has no cervical adenopathy.  Neurological: She is alert and oriented to person, place, and time. No cranial nerve deficit.  Skin: Skin is warm and dry. She is not diaphoretic.  Psychiatric: She has a normal mood and affect. Her behavior is normal. Judgment and thought content normal.  Nursing note and vitals reviewed.  Assessment/Plan: 1. Epigastric pain There is no pattern to pain, does not coincide with eating.  Mostly with movement.  Concern for hiatal hernia, given that symptoms resolve for long periods of time.  Now concerning, due to pain that has been persistent for almost 2 weeks. - US Abdomen Complete; Future - DG UGI W/O KUB; Future  2. Hereditary hemochromatosis (HCC) Pt's last blood draw in May, will recheck.  - US Abdomen Complete; Future  3. Early satiety Will Follow with results. Patients symptoms are vague, however concern for language barrier with husband translating.  - DG UGI W/O KUB; Future. - Will need to see GI for EGD  General Counseling: Tammy Lam verbalizes understanding of the findings of todays visit and agrees with plan of treatment. I have discussed any further diagnostic evaluation that may be needed or ordered today. We also reviewed her medications today. she has been encouraged to call the office with any questions or concerns that should arise related to todays visit.   Orders Placed This Encounter  Procedures  . US Abdomen Complete  . DG UGI W/O KUB  . Fe+TIBC+Fer  . CBC with Differential/Platelet  . Comprehensive metabolic panel   Time spent:25 Minutes  This patient was seen by Blima LedgerAdam Kenly Henckel AGNP-C in Collaboration with Dr Lyndon CodeFozia M Khan as a part of collaborative care agreement

## 2018-05-16 ENCOUNTER — Ambulatory Visit: Payer: Medicaid Other

## 2018-05-16 DIAGNOSIS — M6281 Muscle weakness (generalized): Secondary | ICD-10-CM

## 2018-05-16 DIAGNOSIS — M25522 Pain in left elbow: Secondary | ICD-10-CM

## 2018-05-16 DIAGNOSIS — M25512 Pain in left shoulder: Secondary | ICD-10-CM

## 2018-05-16 DIAGNOSIS — M79602 Pain in left arm: Secondary | ICD-10-CM

## 2018-05-16 DIAGNOSIS — M5412 Radiculopathy, cervical region: Secondary | ICD-10-CM

## 2018-05-16 DIAGNOSIS — M542 Cervicalgia: Secondary | ICD-10-CM

## 2018-05-16 DIAGNOSIS — G8929 Other chronic pain: Secondary | ICD-10-CM

## 2018-05-16 DIAGNOSIS — M79632 Pain in left forearm: Secondary | ICD-10-CM

## 2018-05-16 DIAGNOSIS — M79642 Pain in left hand: Secondary | ICD-10-CM

## 2018-05-16 LAB — COMPREHENSIVE METABOLIC PANEL
ALBUMIN: 4.7 g/dL (ref 3.6–4.8)
ALT: 26 IU/L (ref 0–32)
AST: 21 IU/L (ref 0–40)
Albumin/Globulin Ratio: 2 (ref 1.2–2.2)
Alkaline Phosphatase: 125 IU/L — ABNORMAL HIGH (ref 39–117)
BUN / CREAT RATIO: 14 (ref 12–28)
BUN: 11 mg/dL (ref 8–27)
Bilirubin Total: 0.6 mg/dL (ref 0.0–1.2)
CALCIUM: 9.8 mg/dL (ref 8.7–10.3)
CHLORIDE: 101 mmol/L (ref 96–106)
CO2: 26 mmol/L (ref 20–29)
CREATININE: 0.77 mg/dL (ref 0.57–1.00)
GFR calc Af Amer: 96 mL/min/{1.73_m2} (ref >59)
GFR calc non Af Amer: 83 mL/min/{1.73_m2} (ref >59)
GLUCOSE: 71 mg/dL (ref 65–99)
Globulin, Total: 2.3 g/dL (ref 1.5–4.5)
Potassium: 4.5 mmol/L (ref 3.5–5.2)
Sodium: 144 mmol/L (ref 134–144)
TOTAL PROTEIN: 7 g/dL (ref 6.0–8.5)

## 2018-05-16 LAB — CBC WITH DIFFERENTIAL/PLATELET
Basophils Absolute: 0 10*3/uL (ref 0.0–0.2)
Basos: 1 %
EOS (ABSOLUTE): 0.1 10*3/uL (ref 0.0–0.4)
Eos: 3 %
HEMOGLOBIN: 16.1 g/dL — AB (ref 11.1–15.9)
Hematocrit: 47.7 % — ABNORMAL HIGH (ref 34.0–46.6)
IMMATURE GRANS (ABS): 0 10*3/uL (ref 0.0–0.1)
IMMATURE GRANULOCYTES: 0 %
Lymphocytes Absolute: 2.5 10*3/uL (ref 0.7–3.1)
Lymphs: 48 %
MCH: 30.7 pg (ref 26.6–33.0)
MCHC: 33.8 g/dL (ref 31.5–35.7)
MCV: 91 fL (ref 79–97)
MONOCYTES: 7 %
Monocytes Absolute: 0.3 10*3/uL (ref 0.1–0.9)
Neutrophils Absolute: 2.1 10*3/uL (ref 1.4–7.0)
Neutrophils: 41 %
PLATELETS: 178 10*3/uL (ref 150–450)
RBC: 5.24 x10E6/uL (ref 3.77–5.28)
RDW: 12.4 % (ref 12.3–15.4)
WBC: 5.1 10*3/uL (ref 3.4–10.8)

## 2018-05-16 LAB — IRON,TIBC AND FERRITIN PANEL
Ferritin: 598 ng/mL — ABNORMAL HIGH (ref 15–150)
IRON SATURATION: 37 % (ref 15–55)
IRON: 129 ug/dL (ref 27–139)
Total Iron Binding Capacity: 349 ug/dL (ref 250–450)
UIBC: 220 ug/dL (ref 118–369)

## 2018-05-16 NOTE — Therapy (Signed)
[facility] [address] [place], [address] Phone: [phone]   Fax:  [phone]  Physical Therapy Treatment  Patient Details  Name: Tammy Lam MRN: [id number] Date of Birth: 03-28-1956 Referring Provider: Gurney Maxin, MD   Encounter Date: 05/16/2018  PT End of Session - 05/16/18 1121    Visit Number  22    Number of Visits  28    Date for PT Re-Evaluation  06/22/18    Authorization Type  5 of 12    Authorization Time Period  Medicaid    PT Start Time  1121   interpreter late   PT Stop Time  1202    PT Time Calculation (min)  41 min    Activity Tolerance  Patient limited by pain    Behavior During Therapy  [facility] for tasks assessed/performed       Past Medical History:  Diagnosis Date  . Elevated ferritin   . Headache   . Hemochromatosis   . Hyperlipidemia   . Hyponatremia   . Medical history non-contributory   . Shoulder injury     Past Surgical History:  Procedure Laterality Date  . ANKLE SURGERY Right     There were no vitals filed for this visit.  Subjective Assessment - 05/16/18 1123    Subjective  L neck and UE 4/10 currently.     Pertinent History  L arm and shoulder pain: 16 years ago when pt was at work, pt had to lift heavy items. Pt had electric pad treatment. Also went to PT and for some reason, she could not do exercises.   Pt could not move her arm.  2 years ago pt started taking medication.  1 month ago pt started taking injections.  Medication does help however the injection helps with the pain but also has pain in her elbow to her hand and the back of her neck.  Feels pain from elbow down her whole arm and hand. Feels a lot of pain on the palm of her L hand (pt observed to rub her hand a lot along the L C7/C8 dermatome on her hand).  Denies loss of bowel or bladder control. When she moves her L hand, her pain also affects her head down to her feet on the L side.  Doctor just gave  her injections, no mention of surgery.  Also has pain in the L side of her tongue, eye, face.  Pt states that the neurologist knows about it.  Does not think she can exercise her L UE due to pain.  Last PT was 15-16 years ago.   Pt is L hand dominant.     Patient Stated Goals  Pt expresses desire to get better.     Currently in Pain?  Yes    Pain Score  4     Pain Onset  More than a month ago                               PT Education - 05/16/18 1211    Education provided  Yes    Education Details  ther-ex    [employer]) Educated  Patient    Methods  Explanation;Demonstration;Tactile cues;Verbal cues    Comprehension  Returned demonstration;Verbalized understanding         Objectives  Interpreter Kim   Manual therapy  Seated STMbilateral UT muscles  Seated  STM around  C4/5 area.               Seated STM to distal L scalene and first rib area   Therapeutic exercise  Seated L cervical side bending 5x. Increased L face pain Seated R cervical side bending 4x. Increased L face and ear pain.   Seated looking up 4x. Increased symptoms   Seated chin tucks 5x. Increased symptoms.   Aforementioned exercises performed prior to manual therapy  Caudal pressure L first rib area  L shoulder flexion 5x3  Standing R shoulder extension with scapular retraction to decrease R UT muscle tension resisting yellow band 5x3  Standing R scapular retraction rows 5x2 yellow band  Standing bilateral scapular retraction 5x5 seconds for 3 sets   Standing L scapular depression isometrics 10x5 seconds, manually resisted by PT    Improved exercise technique, movement at target joints, use of target muscles after min to mod verbal, visual, tactile cues.   Continued working on decreasing upper trap, posterior cervical paraspinal, and L scalene muscle tension to help decrease neck and L UE pain. Improved ease with L shoulder flexion observed with caudal  pressure to L first rib. Added R scapular strengthening to help decrease R upper trap tension and hopefully decrease L UE symptoms via the proximal muscle attachment to the neck. Pt will benefit from continued skilled physical therapy services to decrease pain and improve function.      PT Long Term Goals - 03/28/18 1208      PT LONG TERM GOAL #1   Title  Pt will have a decrease in L cervical and L UE pain to 5/10 or less at worst to promote ability to use L UE to perform functional tasks.     Baseline  8/10 at worst. (10/25/2017), (12/12/2017); 4/10 at worst for the past 7 days (01/31/2018); 5/10 pain at worst for the past 7 days (03/28/2018)    Time  12    Period  Weeks    Status  Partially Met    Target Date  06/22/18      PT LONG TERM GOAL #2   Title  Pt will improve her L UE strength by at least 1/2 MMT grade to promote ability to perform functional tasks.     Baseline  Left: shoulder flexion 2+/5, elbow flexion 2+/5, wrist extension 2+/5 (10/25/2017); Not tested today secondary to pt stating pain everytime she lifts her L arm (12/12/2017); L shoulder flexion 3-/5 with anterior shoulder pain, elbow flexion 3-/5, wrist extension 3-/5 (01/31/2018)    Time  6    Period  Weeks    Status  Achieved      PT LONG TERM GOAL #3   Title  Patient will improve her FOTO score by at least 10 points as a demonstration of improved function.     Baseline  4 (filled out with husband's help 10/25/2017); 32 (with interpreter's help, 12/12/2017); 20 (with interpreter's help, 01/31/2018);  16 (with interpreter's help, 03/28/2018)    Time  4    Period  Weeks    Status  Achieved      PT LONG TERM GOAL #4   Title  Patient will improve her Quick Dash score by at least 10% as a demonnstration of improved function.     Baseline  88.6% (filled out with husband's help 10/25/2017); 63.6% (with interpreter's help 12/12/2017); 79.5% (with interpreter's help, 01/31/2018); 65.9% wiht interpreter's help (03/28/2018)    Time  9     Period  Weeks    Status  Achieved    Target Date  04/06/18      PT LONG TERM GOAL #5   Title  Patient will improve her FOTO score to at least 42 points as a demonstration of improved function.     Baseline  32 (12/12/2017); 20 (with interpreter's help, 01/31/2018); 16 (with interpreter's help)    Time  12    Period  Weeks    Status  On-going    Target Date  06/22/18      PT LONG TERM GOAL #6   Title  Patient will improve her Quick Dash score to least 53% or less as a demonnstration of improved function.     Baseline  63% (12/12/2017); 79.5% (with interpreter's help, 01/31/2018); 65.9% (with interpreter's help)    Time  12    Period  Weeks    Status  On-going    Target Date  06/22/18      PT LONG TERM GOAL #7   Title  Patient will improve L UE strength to 3/5 or more MMT grade for L UE to promote ability to perform functional tasks.     Baseline  L shoulder flexion 3-/5 with anterior shoulder pain, elbow flexion 3-/5, wrist extension 3-/5 (01/31/2018), (03/28/2018)    Time  12    Period  Weeks    Status  On-going    Target Date  06/22/18            Plan - 05/16/18 1206    Clinical Impression Statement  Continued working on decreasing upper trap, posterior cervical paraspinal, and L scalene muscle tension to help decrease neck and L UE pain. Improved ease with L shoulder flexion observed with caudal pressure to L first rib. Added R scapular strengthening to help decrease R upper trap tension and hopefully decrease L UE symptoms via the proximal muscle attachment to the neck. Pt will benefit from continued skilled physical therapy services to decrease pain and improve function.     Rehab Potential  Fair    Clinical Impairments Affecting Rehab Potential  Chronicity of condition, multiple areas involved, language barrier, pain, weakness, difficulty using her L UE for tasks    PT Frequency  2x / week    PT Duration  12 weeks   11 visits over 8 weeks after pt returns from trip    PT  Treatment/Interventions  Manual techniques;Patient/family education;Therapeutic activities;Therapeutic exercise;Passive range of motion;Dry needling;Neuromuscular re-education;Ultrasound;Electrical Stimulation;Iontophoresis 59m/ml Dexamethasone;Aquatic Therapy   traction if appropriate   PT Next Visit Plan  manual techniques, ROM, gentle strengthening, modalities as needed    Consulted and Agree with Plan of Care  Patient       Patient will benefit from skilled therapeutic intervention in order to improve the following deficits and impairments:  Pain, Postural dysfunction, Improper body mechanics, Impaired UE functional use, Decreased strength, Decreased range of motion  Visit Diagnosis: Muscle weakness (generalized)  Radiculopathy, cervical region  Cervicalgia  Pain in left arm  Pain in left elbow  Chronic left shoulder pain  Pain in left hand  Pain in left forearm     Problem List Patient Active Problem List   Diagnosis Date Noted  . Abnormal brain scan 02/15/2017  . Inflammation of shoulder joint 02/15/2017  . Neck pain 02/15/2017  . [place], [place] ([place] 01/18/2017  . Hypertriglyceridemia 11/13/2016  . Blurry vision 05/24/2016  . Chronic tension-type headache, not intractable 05/24/2016  . Acute headache 04/19/2016  . Left arm pain  04/19/2016  . Rotator cuff tear 03/08/2016  . Carpal tunnel syndrome 03/08/2016  . Cervical radiculitis 03/08/2016    Joneen Boers PT, DPT   05/16/2018, 12:12 PM  [facility] [address], [place], [address] Phone: [phone]   Fax:  [phone]  Name: Tammy Lam MRN: [id number] Date of Birth: 1955-12-09

## 2018-05-18 ENCOUNTER — Ambulatory Visit
Admission: RE | Admit: 2018-05-18 | Discharge: 2018-05-18 | Disposition: A | Discharge status: Home / Self Care | Payer: Medicaid Other | Source: Ambulatory Visit | Attending: Adult Health | Admitting: Adult Health

## 2018-05-18 DIAGNOSIS — R6881 Early satiety: Secondary | ICD-10-CM | POA: Diagnosis present

## 2018-05-18 DIAGNOSIS — R1013 Epigastric pain: Secondary | ICD-10-CM | POA: Diagnosis not present

## 2018-05-19 ENCOUNTER — Other Ambulatory Visit: Payer: Self-pay | Admitting: Adult Health

## 2018-05-23 ENCOUNTER — Ambulatory Visit: Payer: Medicaid Other | Attending: Neurology

## 2018-05-23 DIAGNOSIS — M79632 Pain in left forearm: Secondary | ICD-10-CM | POA: Diagnosis present

## 2018-05-23 DIAGNOSIS — G8929 Other chronic pain: Secondary | ICD-10-CM | POA: Diagnosis present

## 2018-05-23 DIAGNOSIS — M79642 Pain in left hand: Secondary | ICD-10-CM | POA: Diagnosis present

## 2018-05-23 DIAGNOSIS — M542 Cervicalgia: Secondary | ICD-10-CM | POA: Insufficient documentation

## 2018-05-23 DIAGNOSIS — M25522 Pain in left elbow: Secondary | ICD-10-CM | POA: Diagnosis present

## 2018-05-23 DIAGNOSIS — M79602 Pain in left arm: Secondary | ICD-10-CM | POA: Diagnosis present

## 2018-05-23 DIAGNOSIS — M5412 Radiculopathy, cervical region: Secondary | ICD-10-CM | POA: Diagnosis present

## 2018-05-23 DIAGNOSIS — M6281 Muscle weakness (generalized): Secondary | ICD-10-CM | POA: Insufficient documentation

## 2018-05-23 DIAGNOSIS — M25512 Pain in left shoulder: Secondary | ICD-10-CM | POA: Diagnosis present

## 2018-05-23 NOTE — Therapy (Signed)
[facility] [address] [place], [address] Phone: [phone]   Fax:  [phone]  Physical Therapy Treatment  Patient Details  Name: Tammy Lam MRN: [id number] Date of Birth: [birth year] Referring Provider (PT): Gurney Maxin, MD   Encounter Date: 05/23/2018  PT End of Session - 05/23/18 1223    Visit Number  23    Number of Visits  28    Date for PT Re-Evaluation  06/22/18    Authorization Type  5 of 12    Authorization Time Period  Medicaid    PT Start Time  1121    PT Stop Time  1156    PT Time Calculation (min)  35 min    Activity Tolerance  Patient limited by pain    Behavior During Therapy  [facility] for tasks assessed/performed       Past Medical History:  Diagnosis Date  . Elevated ferritin   . Headache   . Hemochromatosis   . Hyperlipidemia   . Hyponatremia   . Medical history non-contributory   . Shoulder injury     Past Surgical History:  Procedure Laterality Date  . ANKLE SURGERY Right     There were no vitals filed for this visit.  Subjective Assessment - 05/23/18 1217    Subjective  Pt doing well today, pain about 3/10 this date. Still having pain with movement of arm walking, but will hold onto arm during walking.      Pertinent History  L arm and shoulder pain: 16 years ago when pt was at work, pt had to lift heavy items. Pt had electric pad treatment. Also went to PT and for some reason, she could not do exercises.   Pt could not move her arm.  2 years ago pt started taking medication.  1 month ago pt started taking injections.  Medication does help however the injection helps with the pain but also has pain in her elbow to her hand and the back of her neck.  Feels pain from elbow down her whole arm and hand. Feels a lot of pain on the palm of her L hand (pt observed to rub her hand a lot along the L C7/C8 dermatome on her hand).  Denies loss of bowel or bladder control. When she moves  her L hand, her pain also affects her head down to her feet on the L side.  Doctor just gave her injections, no mention of surgery.  Also has pain in the L side of her tongue, eye, face.  Pt states that the neurologist knows about it.  Does not think she can exercise her L UE due to pain.  Last PT was 15-16 years ago.   Pt is L hand dominant.         Treatment This Session: -Myofascial release to left upper trap, cervical extensors, Lt SCM, Left scalenes,  -Manual Cervical traction 3x60sec -Seated scapular elevation: 10x5sec (manually assisted on left)  -seated scapular retraction: 10x5sec (heavy verbal cues required for assist)        PT Long Term Goals - 03/28/18 1208      PT LONG TERM GOAL #1   Title  Pt will have a decrease in L cervical and L UE pain to 5/10 or less at worst to promote ability to use L UE to perform functional tasks.     Baseline  8/10 at worst. (10/25/2017), (12/12/2017); 4/10 at worst for the past 7 days (  01/31/2018); 5/10 pain at worst for the past 7 days (03/28/2018)    Time  12    Period  Weeks    Status  Partially Met    Target Date  06/22/18      PT LONG TERM GOAL #2   Title  Pt will improve her L UE strength by at least 1/2 MMT grade to promote ability to perform functional tasks.     Baseline  Left: shoulder flexion 2+/5, elbow flexion 2+/5, wrist extension 2+/5 (10/25/2017); Not tested today secondary to pt stating pain everytime she lifts her L arm (12/12/2017); L shoulder flexion 3-/5 with anterior shoulder pain, elbow flexion 3-/5, wrist extension 3-/5 (01/31/2018)    Time  6    Period  Weeks    Status  Achieved      PT LONG TERM GOAL #3   Title  Patient will improve her FOTO score by at least 10 points as a demonstration of improved function.     Baseline  4 (filled out with husband's help 10/25/2017); 32 (with interpreter's help, 12/12/2017); 20 (with interpreter's help, 01/31/2018);  16 (with interpreter's help, 03/28/2018)    Time  4    Period  Weeks     Status  Achieved      PT LONG TERM GOAL #4   Title  Patient will improve her Quick Dash score by at least 10% as a demonnstration of improved function.     Baseline  88.6% (filled out with husband's help 10/25/2017); 63.6% (with interpreter's help 12/12/2017); 79.5% (with interpreter's help, 01/31/2018); 65.9% wiht interpreter's help (03/28/2018)    Time  9    Period  Weeks    Status  Achieved    Target Date  04/06/18      PT LONG TERM GOAL #5   Title  Patient will improve her FOTO score to at least 42 points as a demonstration of improved function.     Baseline  32 (12/12/2017); 20 (with interpreter's help, 01/31/2018); 16 (with interpreter's help)    Time  12    Period  Weeks    Status  On-going    Target Date  06/22/18      PT LONG TERM GOAL #6   Title  Patient will improve her Quick Dash score to least 53% or less as a demonnstration of improved function.     Baseline  63% (12/12/2017); 79.5% (with interpreter's help, 01/31/2018); 65.9% (with interpreter's help)    Time  12    Period  Weeks    Status  On-going    Target Date  06/22/18      PT LONG TERM GOAL #7   Title  Patient will improve L UE strength to 3/5 or more MMT grade for L UE to promote ability to perform functional tasks.     Baseline  L shoulder flexion 3-/5 with anterior shoulder pain, elbow flexion 3-/5, wrist extension 3-/5 (01/31/2018), (03/28/2018)    Time  12    Period  Weeks    Status  On-going    Target Date  06/22/18            Plan - 05/23/18 1224    Clinical Impression Statement  Session with late start as on hold awaiting tele interpreter. Continued with manual therapy to address muscular restrictions in neck and intermittent traction to facilitate relaxation. Pt tolerating pressure well in session and reports reduciton in pain at end of session. Pt denies questions regarding curren tHEP program.  Rehab Potential  Fair    Clinical Impairments Affecting Rehab Potential  Chronicity of condition, multiple  areas involved, language barrier, pain, weakness, difficulty using her L UE for tasks    PT Frequency  2x / week    PT Duration  12 weeks    PT Treatment/Interventions  Manual techniques;Patient/family education;Therapeutic activities;Therapeutic exercise;Passive range of motion;Dry needling;Neuromuscular re-education;Ultrasound;Electrical Stimulation;Iontophoresis 79m/ml Dexamethasone;Aquatic Therapy    PT Next Visit Plan  manual techniques, ROM, gentle strengthening, modalities as needed    Consulted and Agree with Plan of Care  Patient    Family Member Consulted  husband       Patient will benefit from skilled therapeutic intervention in order to improve the following deficits and impairments:  Pain, Postural dysfunction, Improper body mechanics, Impaired UE functional use, Decreased strength, Decreased range of motion  Visit Diagnosis: Muscle weakness (generalized)  Radiculopathy, cervical region  Cervicalgia  Pain in left arm  Pain in left elbow  Chronic left shoulder pain  Pain in left hand  Pain in left forearm     Problem List Patient Active Problem List   Diagnosis Date Noted  . Abnormal brain scan 02/15/2017  . Inflammation of shoulder joint 02/15/2017  . Neck pain 02/15/2017  . Hemochromatosis, hereditary ([place] 01/18/2017  . Hypertriglyceridemia 11/13/2016  . Blurry vision 05/24/2016  . Chronic tension-type headache, not intractable 05/24/2016  . Acute headache 04/19/2016  . Left arm pain 04/19/2016  . Rotator cuff tear 03/08/2016  . Carpal tunnel syndrome 03/08/2016  . Cervical radiculitis 03/08/2016   12:53 PM, 05/23/18 AEtta Grandchild PT, DPT Physical Therapist - [phone](Office)    Tammy Lam C 05/23/2018, 12:50 PM  [facility] [address] [place] [address]Phone: [phone]  Fax:  [phone] Name: DSuanne MinahanMRN: [id number] of Birth:  [birth year]

## 2018-05-25 ENCOUNTER — Ambulatory Visit: Payer: Medicaid Other

## 2018-05-25 DIAGNOSIS — M79602 Pain in left arm: Secondary | ICD-10-CM

## 2018-05-25 DIAGNOSIS — M5412 Radiculopathy, cervical region: Secondary | ICD-10-CM

## 2018-05-25 DIAGNOSIS — M79642 Pain in left hand: Secondary | ICD-10-CM

## 2018-05-25 DIAGNOSIS — M542 Cervicalgia: Secondary | ICD-10-CM

## 2018-05-25 DIAGNOSIS — G8929 Other chronic pain: Secondary | ICD-10-CM

## 2018-05-25 DIAGNOSIS — M25522 Pain in left elbow: Secondary | ICD-10-CM

## 2018-05-25 DIAGNOSIS — M6281 Muscle weakness (generalized): Secondary | ICD-10-CM | POA: Diagnosis not present

## 2018-05-25 DIAGNOSIS — M25512 Pain in left shoulder: Secondary | ICD-10-CM

## 2018-05-25 DIAGNOSIS — M79632 Pain in left forearm: Secondary | ICD-10-CM

## 2018-05-25 NOTE — Therapy (Signed)
[facility] [address], [place], [address] Phone: [phone]   Fax:  [phone]  Physical Therapy Treatment  Patient Details  Name: Tammy Lam MRN: [id number] Date of Birth: 08-30-1955 Referring Provider (PT): Gurney Maxin, MD   Encounter Date: 05/25/2018  PT End of Session - 05/25/18 1037    Visit Number  24    Number of Visits  28    Date for PT Re-Evaluation  06/22/18    Authorization Type  6 of 12    Authorization Time Period  Medicaid    PT Start Time  1037    PT Stop Time  1117    PT Time Calculation (min)  40 min    Activity Tolerance  Patient limited by pain    Behavior During Therapy  [facility] for tasks assessed/performed       Past Medical History:  Diagnosis Date  . Elevated ferritin   . Headache   . Hemochromatosis   . Hyperlipidemia   . Hyponatremia   . Medical history non-contributory   . Shoulder injury     Past Surgical History:  Procedure Laterality Date  . ANKLE SURGERY Right     There were no vitals filed for this visit.  Subjective Assessment - 05/25/18 1039    Subjective  Feels better. 3.5/10 currently. Stopped taking her pain medication last week due to severe stomach pain. 4/10 L UE pain at most for the past 7 days.     Pertinent History  L arm and shoulder pain: 16 years ago when pt was at work, pt had to lift heavy items. Pt had electric pad treatment. Also went to PT and for some reason, she could not do exercises.   Pt could not move her arm.  2 years ago pt started taking medication.  1 month ago pt started taking injections.  Medication does help however the injection helps with the pain but also has pain in her elbow to her hand and the back of her neck.  Feels pain from elbow down her whole arm and hand. Feels a lot of pain on the palm of her L hand (pt observed to rub her hand a lot along the L C7/C8 dermatome on her hand).  Denies loss of bowel or bladder control.  When she moves her L hand, her pain also affects her head down to her feet on the L side.  Doctor just gave her injections, no mention of surgery.  Also has pain in the L side of her tongue, eye, face.  Pt states that the neurologist knows about it.  Does not think she can exercise her L UE due to pain.  Last PT was 15-16 years ago.   Pt is L hand dominant.     Currently in Pain?  Yes    Pain Score  4    3.5/10         [facility] PT Assessment - 05/25/18 0001      Observation/Other Assessments   Focus on Therapeutic Outcomes (FOTO)   27    Quick DASH   79.5%      Strength   Left Shoulder Flexion  3-/5    Left Elbow Flexion  3-/5    Left Wrist Extension  3-/5                           PT Education - 05/25/18 1058  Education provided  Yes    Education Details  ther-ex    Person(s) Educated  Patient    Methods  Explanation;Demonstration;Tactile cues;Verbal cues    Comprehension  Returned demonstration;Verbalized understanding         Objectives  Interpreter Kim   4/10 L UE pain at most for the past 7 days    Manual therapy  Seated STM around C4/5 area.   Seated STMbilateral UTmuscles   Seated STM to distal L scalene and first rib area   Therapeutic exercise L shoulder flexion with caudal pressure to L first rib area 3x, then 2x5  Standing R shoulder extension with scapular retraction to decrease R UT muscle tension resisting yellow band 5x3  Standing R scapular retraction rows 5x2 yellow band  Standing bilateral scapular retraction 5x5 seconds for 3 sets   L shoulder flexion with scapular retraction 2x  L shoulder flexion 3-/5,  L elbow flexion 3-/5 L wrist extension 3-/5   Improved exercise technique, movement at target joints, use of target muscles after min to mod verbal, visual, tactile cues.   FOTO filled out at end of session with interpreter.     Pt demonstrates good overall progress with decreasing L  cervical and UE pain with worst pain level for the past 7 days being 4/10. Continued working on decreasing soft tissue tension at cervical paraspinal, upper trap and scalene muscle area as well as improving scapular strength and L shoulder flexion to help decrease UT tension and improve L UE function. Pt tolerated session well without aggravation of symptoms. Pt will benefit from continued skilled physical therapy services to continue to decrease pain, maintain progress, improve function.      PT Long Term Goals - 03/28/18 1208      PT LONG TERM GOAL #1   Title  Pt will have a decrease in L cervical and L UE pain to 5/10 or less at worst to promote ability to use L UE to perform functional tasks.     Baseline  8/10 at worst. (10/25/2017), (12/12/2017); 4/10 at worst for the past 7 days (01/31/2018); 5/10 pain at worst for the past 7 days (03/28/2018)    Time  12    Period  Weeks    Status  Partially Met    Target Date  06/22/18      PT LONG TERM GOAL #2   Title  Pt will improve her L UE strength by at least 1/2 MMT grade to promote ability to perform functional tasks.     Baseline  Left: shoulder flexion 2+/5, elbow flexion 2+/5, wrist extension 2+/5 (10/25/2017); Not tested today secondary to pt stating pain everytime she lifts her L arm (12/12/2017); L shoulder flexion 3-/5 with anterior shoulder pain, elbow flexion 3-/5, wrist extension 3-/5 (01/31/2018)    Time  6    Period  Weeks    Status  Achieved      PT LONG TERM GOAL #3   Title  Patient will improve her FOTO score by at least 10 points as a demonstration of improved function.     Baseline  4 (filled out with husband's help 10/25/2017); 32 (with interpreter's help, 12/12/2017); 20 (with interpreter's help, 01/31/2018);  16 (with interpreter's help, 03/28/2018)    Time  4    Period  Weeks    Status  Achieved      PT LONG TERM GOAL #4   Title  Patient will improve her Quick Dash score by at least 10%  as a demonnstration of improved function.      Baseline  88.6% (filled out with husband's help 10/25/2017); 63.6% (with interpreter's help 12/12/2017); 79.5% (with interpreter's help, 01/31/2018); 65.9% wiht interpreter's help (03/28/2018)    Time  9    Period  Weeks    Status  Achieved    Target Date  04/06/18      PT LONG TERM GOAL #5   Title  Patient will improve her FOTO score to at least 42 points as a demonstration of improved function.     Baseline  32 (12/12/2017); 20 (with interpreter's help, 01/31/2018); 16 (with interpreter's help)    Time  12    Period  Weeks    Status  On-going    Target Date  06/22/18      PT LONG TERM GOAL #6   Title  Patient will improve her Quick Dash score to least 53% or less as a demonnstration of improved function.     Baseline  63% (12/12/2017); 79.5% (with interpreter's help, 01/31/2018); 65.9% (with interpreter's help)    Time  12    Period  Weeks    Status  On-going    Target Date  06/22/18      PT LONG TERM GOAL #7   Title  Patient will improve L UE strength to 3/5 or more MMT grade for L UE to promote ability to perform functional tasks.     Baseline  L shoulder flexion 3-/5 with anterior shoulder pain, elbow flexion 3-/5, wrist extension 3-/5 (01/31/2018), (03/28/2018)    Time  12    Period  Weeks    Status  On-going    Target Date  06/22/18            Plan - 05/25/18 1059    Clinical Impression Statement  Pt demonstrates good overall progress with decreasing L cervical and UE pain with worst pain level for the past 7 days being 4/10. Continued working on decreasing soft tissue tension at cervical paraspinal, upper trap and scalene muscle area as well as improving scapular strength and L shoulder flexion to help decrease UT tension and improve L UE function. Pt tolerated session well without aggravation of symptoms. Pt will benefit from continued skilled physical therapy services to continue to decrease pain, maintain progress, improve function.     Rehab Potential  Fair    Clinical  Impairments Affecting Rehab Potential  Chronicity of condition, multiple areas involved, language barrier, pain, weakness, difficulty using her L UE for tasks    PT Frequency  2x / week    PT Duration  12 weeks    PT Treatment/Interventions  Manual techniques;Patient/family education;Therapeutic activities;Therapeutic exercise;Passive range of motion;Dry needling;Neuromuscular re-education;Ultrasound;Electrical Stimulation;Iontophoresis 68m/ml Dexamethasone;Aquatic Therapy    PT Next Visit Plan  manual techniques, ROM, gentle strengthening, modalities as needed    Consulted and Agree with Plan of Care  Patient    Family Member Consulted  husband       Patient will benefit from skilled therapeutic intervention in order to improve the following deficits and impairments:  Pain, Postural dysfunction, Improper body mechanics, Impaired UE functional use, Decreased strength, Decreased range of motion  Visit Diagnosis: Muscle weakness (generalized)  Radiculopathy, cervical region  Cervicalgia  Pain in left arm  Pain in left elbow  Chronic left shoulder pain  Pain in left hand  Pain in left forearm     Problem List Patient Active Problem List   Diagnosis Date Noted  . Abnormal brain  scan 02/15/2017  . Inflammation of shoulder joint 02/15/2017  . Neck pain 02/15/2017  . Hemochromatosis, hereditary ([place]) 01/18/2017  . Hypertriglyceridemia 11/13/2016  . Blurry vision 05/24/2016  . Chronic tension-type headache, not intractable 05/24/2016  . Acute headache 04/19/2016  . Left arm pain 04/19/2016  . Rotator cuff tear 03/08/2016  . Carpal tunnel syndrome 03/08/2016  . Cervical radiculitis 03/08/2016    Joneen Boers PT, DPT   05/25/2018, 11:45 AM  [facility] [address], [place], [address] Phone: [phone]   Fax:  [phone]  Name: Tammy Lam MRN: [id number] Date of Birth: 31-Mar-1956

## 2018-05-26 ENCOUNTER — Ambulatory Visit: Payer: Medicaid Other

## 2018-05-26 DIAGNOSIS — R1013 Epigastric pain: Secondary | ICD-10-CM | POA: Diagnosis not present

## 2018-05-29 ENCOUNTER — Ambulatory Visit: Payer: Medicaid Other

## 2018-05-29 ENCOUNTER — Inpatient Hospital Stay: Payer: Medicaid Other | Attending: Internal Medicine | Admitting: Internal Medicine

## 2018-05-29 DIAGNOSIS — G8929 Other chronic pain: Secondary | ICD-10-CM

## 2018-05-29 DIAGNOSIS — M6281 Muscle weakness (generalized): Secondary | ICD-10-CM

## 2018-05-29 DIAGNOSIS — M79602 Pain in left arm: Secondary | ICD-10-CM

## 2018-05-29 DIAGNOSIS — M25522 Pain in left elbow: Secondary | ICD-10-CM

## 2018-05-29 DIAGNOSIS — M542 Cervicalgia: Secondary | ICD-10-CM

## 2018-05-29 DIAGNOSIS — M25512 Pain in left shoulder: Secondary | ICD-10-CM

## 2018-05-29 DIAGNOSIS — M79632 Pain in left forearm: Secondary | ICD-10-CM

## 2018-05-29 DIAGNOSIS — M5412 Radiculopathy, cervical region: Secondary | ICD-10-CM

## 2018-05-29 DIAGNOSIS — M79642 Pain in left hand: Secondary | ICD-10-CM

## 2018-05-29 NOTE — Assessment & Plan Note (Addendum)
#   Primary- hereditary hemochromatosis. H63D Heterozygous; September 2019 iron studies show-Elevated ferritin 598 saturation 36%.  #   Asymptomatic without any concerns for iron overload.  Recommend phlebotomy if ferritin >1000; saturation given 60.  Stable.  # Left neck/shouler pain/ arm pain- chronic-stable.  #Again reviewed with the patient to avoid iron supplementation.  # Keep appt for may 2020; labs 1 week prior/  #No phlebotomy today.  Cc: [name]

## 2018-05-29 NOTE — Therapy (Signed)
[place] [facility] [address], [place], [address] Phone: [phone]   Fax:  [phone]  Physical Therapy Treatment  Patient Details  Name: Tammy Lam MRN: [id number] Date of Birth: Apr 07, 1956 Referring Provider (PT): Gurney Maxin, MD   Encounter Date: 05/29/2018  PT End of Session - 05/29/18 1338    Visit Number  25    Number of Visits  28    Date for PT Re-Evaluation  06/22/18    Authorization Type  7 of 12    Authorization Time Period  Medicaid    PT Start Time  6222    PT Stop Time  1430    PT Time Calculation (min)  51 min    Activity Tolerance  Patient limited by pain    Behavior During Therapy  [facility] for tasks assessed/performed       Past Medical History:  Diagnosis Date  . Elevated ferritin   . Headache   . Hemochromatosis   . Hyperlipidemia   . Hyponatremia   . Medical history non-contributory   . Shoulder injury     Past Surgical History:  Procedure Laterality Date  . ANKLE SURGERY Right     There were no vitals filed for this visit.  Subjective Assessment - 05/29/18 1340    Subjective  Has not yet had lunch due to doctor appointments. Does not feel hungry when she goes to appointments. Has energy to do exercises. Does not have diabetes.  3/10 L side currently.     Pertinent History  L arm and shoulder pain: 16 years ago when pt was at work, pt had to lift heavy items. Pt had electric pad treatment. Also went to PT and for some reason, she could not do exercises.   Pt could not move her arm.  2 years ago pt started taking medication.  1 month ago pt started taking injections.  Medication does help however the injection helps with the pain but also has pain in her elbow to her hand and the back of her neck.  Feels pain from elbow down her whole arm and hand. Feels a lot of pain on the palm of her L hand (pt observed to rub her hand a lot along the L C7/C8 dermatome on her hand).  Denies loss of  bowel or bladder control. When she moves her L hand, her pain also affects her head down to her feet on the L side.  Doctor just gave her injections, no mention of surgery.  Also has pain in the L side of her tongue, eye, face.  Pt states that the neurologist knows about it.  Does not think she can exercise her L UE due to pain.  Last PT was 15-16 years ago.   Pt is L hand dominant.     Currently in Pain?  Yes    Pain Score  3                                PT Education - 05/29/18 1701    Education provided  Yes    Education Details  ther-ex    [employer]) Educated  Patient    Methods  Explanation;Demonstration;Tactile cues;Verbal cues    Comprehension  Returned demonstration;Verbalized understanding         Objectives  Interpreter Kim    Manual therapy  Seated STM around C4/5 area.   Seated STMbilateralUTmuscles  Seated STM to distal L scalene and first rib area   Therapeutic exercise  L shoulder flexion with caudal pressure to L first rib area 5x2  Pressing tongue at roof of mouth 10x5 seconds. L neck symptoms which eases with rest.   Standing R shoulder extension with scapular retraction to decrease R upper trap muscle tension resisting yellow band 5x4  Tootsie roll sized lump felt bilateral lateral neck. Pt was recommended to let her MD know about it. Pt verbalized understanding.   Standing bilateral scapular retraction 5x5 seconds for 3 sets   Improved ability to perform scapular retractions observed  Standing R scapular retraction rows 5x3 yellow band to help decrease R upper trap tension  Standing with L hand on treadmill bar  Scapular depression isometics 3x5 with 5 seconds. Decreased symptoms with activation of scapular depressors   Placing L hand onto treadmil bar with scapular depression isometrics, resistance from PT 5x2. Decreased pain when moving her forearm and hand, and arm  Give as part of HEP next visit if  appropriate   Improved exercise technique, movement at target joints, use of target muscles after min to mod verbal, visual, tactile cues.  Decreased L UE pain with flexion related movements with activation of scapular depressors muscles with manual PT resistance as well as with treatment to decrease L scalene muscle tension. Continued with STM to decrease tension to cervical paraspinal, L upper trap muscle and scalene muscle area to decrease pain. Patient will benefit from continued skilled physical therapy services to decrease pain, improve L UE movements and improve function.       PT Long Term Goals - 03/28/18 1208      PT LONG TERM GOAL #1   Title  Pt will have a decrease in L cervical and L UE pain to 5/10 or less at worst to promote ability to use L UE to perform functional tasks.     Baseline  8/10 at worst. (10/25/2017), (12/12/2017); 4/10 at worst for the past 7 days (01/31/2018); 5/10 pain at worst for the past 7 days (03/28/2018)    Time  12    Period  Weeks    Status  Partially Met    Target Date  06/22/18      PT LONG TERM GOAL #2   Title  Pt will improve her L UE strength by at least 1/2 MMT grade to promote ability to perform functional tasks.     Baseline  Left: shoulder flexion 2+/5, elbow flexion 2+/5, wrist extension 2+/5 (10/25/2017); Not tested today secondary to pt stating pain everytime she lifts her L arm (12/12/2017); L shoulder flexion 3-/5 with anterior shoulder pain, elbow flexion 3-/5, wrist extension 3-/5 (01/31/2018)    Time  6    Period  Weeks    Status  Achieved      PT LONG TERM GOAL #3   Title  Patient will improve her FOTO score by at least 10 points as a demonstration of improved function.     Baseline  4 (filled out with husband's help 10/25/2017); 32 (with interpreter's help, 12/12/2017); 20 (with interpreter's help, 01/31/2018);  16 (with interpreter's help, 03/28/2018)    Time  4    Period  Weeks    Status  Achieved      PT LONG TERM GOAL #4   Title   Patient will improve her Quick Dash score by at least 10% as a demonnstration of improved function.     Baseline  88.6% (filled  out with husband's help 10/25/2017); 63.6% (with interpreter's help 12/12/2017); 79.5% (with interpreter's help, 01/31/2018); 65.9% wiht interpreter's help (03/28/2018)    Time  9    Period  Weeks    Status  Achieved    Target Date  04/06/18      PT LONG TERM GOAL #5   Title  Patient will improve her FOTO score to at least 42 points as a demonstration of improved function.     Baseline  32 (12/12/2017); 20 (with interpreter's help, 01/31/2018); 16 (with interpreter's help)    Time  12    Period  Weeks    Status  On-going    Target Date  06/22/18      PT LONG TERM GOAL #6   Title  Patient will improve her Quick Dash score to least 53% or less as a demonnstration of improved function.     Baseline  63% (12/12/2017); 79.5% (with interpreter's help, 01/31/2018); 65.9% (with interpreter's help)    Time  12    Period  Weeks    Status  On-going    Target Date  06/22/18      PT LONG TERM GOAL #7   Title  Patient will improve L UE strength to 3/5 or more MMT grade for L UE to promote ability to perform functional tasks.     Baseline  L shoulder flexion 3-/5 with anterior shoulder pain, elbow flexion 3-/5, wrist extension 3-/5 (01/31/2018), (03/28/2018)    Time  12    Period  Weeks    Status  On-going    Target Date  06/22/18            Plan - 05/29/18 1336    Clinical Impression Statement  Decreased L UE pain with flexion related movements with activation of scapular depressors muscles with manual PT resistance as well as with treatment to decrease L scalene muscle tension. Continued with STM to decrease tension to cervical paraspinal, L upper trap muscle and scalene muscle area to decrease pain. Patient will benefit from continued skilled physical therapy services to decrease pain, improve L UE movements and improve function.     Rehab Potential  Fair    Clinical  Impairments Affecting Rehab Potential  Chronicity of condition, multiple areas involved, language barrier, pain, weakness, difficulty using her L UE for tasks    PT Frequency  2x / week    PT Duration  12 weeks    PT Treatment/Interventions  Manual techniques;Patient/family education;Therapeutic activities;Therapeutic exercise;Passive range of motion;Dry needling;Neuromuscular re-education;Ultrasound;Electrical Stimulation;Iontophoresis 99m/ml Dexamethasone;Aquatic Therapy    PT Next Visit Plan  manual techniques, ROM, gentle strengthening, modalities as needed    Consulted and Agree with Plan of Care  Patient    Family Member Consulted  husband       Patient will benefit from skilled therapeutic intervention in order to improve the following deficits and impairments:  Pain, Postural dysfunction, Improper body mechanics, Impaired UE functional use, Decreased strength, Decreased range of motion  Visit Diagnosis: Muscle weakness (generalized)  Radiculopathy, cervical region  Cervicalgia  Pain in left arm  Pain in left elbow  Pain in left hand  Chronic left shoulder pain  Pain in left forearm     Problem List Patient Active Problem List   Diagnosis Date Noted  . Abnormal brain scan 02/15/2017  . Inflammation of shoulder joint 02/15/2017  . Neck pain 02/15/2017  . [place] 01/18/2017  . Hypertriglyceridemia 11/13/2016  . Blurry vision 05/24/2016  . Chronic  tension-type headache, not intractable 05/24/2016  . Acute headache 04/19/2016  . Left arm pain 04/19/2016  . Rotator cuff tear 03/08/2016  . Carpal tunnel syndrome 03/08/2016  . Cervical radiculitis 03/08/2016    Joneen Boers PT, DPT   05/29/2018, 5:11 PM  [facility] [address], [place], [address] Phone: [phone]   Fax:  [phone]  Name: Tammy Lam MRN: [id number] Date of Birth: 28-Mar-1956

## 2018-05-29 NOTE — Progress Notes (Signed)
[facility] CONSULT NOTE  Patient Care Team: Lyndon Code, MD as PCP - General (Internal Medicine)  CHIEF COMPLAINTS/PURPOSE OF CONSULTATION:   # APRIL 2017-HETEROZYGOUS H63D HEMOCHROMATOSIS MUTATION; ELEVATED FERRITIN Tammy Lam [id number]; N-150]; sat-38%; LFTs- Unremarkable; Hb 15/platlets- 155; creat- 0.74; April 2017- Ferritin- 332- No Phlebotomy.   # CHRONIC JOINT PAINS [? Left side > 10 years ]  HISTORY OF PRESENTING ILLNESS: Accompanied by interpreter.  Tammy Lam 62 y.o.  female with Ms. female patient is here for follow-up for her hemochromatosis.  Patient continues to complain of chronic arthritic pain in her joints.  Also left half of the body.  Patient has been evaluated by neurosurgery/neurology.   Denies any skin rash.  Denies any swelling in the legs.  Denies abdominal distention.  No worsening joint pains.  Review of Systems  Constitutional: Positive for malaise/fatigue. Negative for chills, diaphoresis, fever and weight loss.  HENT: Negative for nosebleeds and sore throat.   Eyes: Negative for double vision.  Respiratory: Negative for cough, hemoptysis, sputum production, shortness of breath and wheezing.   Cardiovascular: Negative for chest pain, palpitations, orthopnea and leg swelling.  Gastrointestinal: Negative for abdominal pain, blood in stool, constipation, diarrhea, heartburn, melena, nausea and vomiting.  Genitourinary: Negative for dysuria, frequency and urgency.  Musculoskeletal: Positive for back pain and joint pain.  Skin: Negative.  Negative for itching and rash.  Neurological: Negative for dizziness, tingling, focal weakness, weakness and headaches.  Endo/Heme/Allergies: Does not bruise/bleed easily.  Psychiatric/Behavioral: Negative for depression. The patient is not nervous/anxious and does not have insomnia.        MEDICAL HISTORY:  Past Medical History:  Diagnosis Date  . Elevated ferritin   . Headache   . Hemochromatosis   .  Hyperlipidemia   . Hyponatremia   . Medical history non-contributory   . Shoulder injury     SURGICAL HISTORY: Past Surgical History:  Procedure Laterality Date  . ANKLE SURGERY Right     SOCIAL HISTORY: originally from [place]; moved from [place] appx 1 year ago.  Social History   Socioeconomic History  . Marital status: Married    Spouse name: Not on file  . Number of children: Not on file  . Years of education: Not on file  . Highest education level: Not on file  Occupational History  . Not on file  Social Needs  . Financial resource strain: Not on file  . Food insecurity:    Worry: Not on file    Inability: Not on file  . Transportation needs:    Medical: Not on file    Non-medical: Not on file  Tobacco Use  . Smoking status: Never Smoker  . Smokeless tobacco: Never Used  Substance and Sexual Activity  . Alcohol use: No    Alcohol/week: 0.0 standard drinks  . Drug use: No  . Sexual activity: Yes    Partners: Male  Lifestyle  . Physical activity:    Days per week: Not on file    Minutes per session: Not on file  . Stress: Not on file  Relationships  . Social connections:    Talks on phone: Not on file    Gets together: Not on file    Attends religious service: Not on file    Active member of club or organization: Not on file    Attends meetings of clubs or organizations: Not on file    Relationship status: Not on file  . Intimate partner violence:  Fear of current or ex partner: Not on file    Emotionally abused: Not on file    Physically abused: Not on file    Forced sexual activity: Not on file  Other Topics Concern  . Not on file  Social History Narrative  . Not on file    FAMILY HISTORY: No family in no family history of liver disease or liver cancers.   ALLERGIES:  is allergic to benadryl [diphenhydramine hcl]; influenza vaccines; and penicillins.  MEDICATIONS:  Current Outpatient Medications  Medication Sig Dispense Refill  .  diclofenac sodium (VOLTAREN) 1 % GEL Apply topically 4 (four) times daily.    . nortriptyline (PAMELOR) 10 MG capsule Take 10 mg by mouth at bedtime.    . fenofibrate (TRICOR) 145 MG tablet Take 1 tablet by mouth daily.    Marland Kitchen gabapentin (NEURONTIN) 100 MG capsule Take 1 capsule by mouth 4 (four) times daily.    Marland Kitchen ibuprofen (ADVIL,MOTRIN) 600 MG tablet Take by mouth.     No current facility-administered medications for this visit.       Marland Kitchen  PHYSICAL EXAMINATION: ECOG PERFORMANCE STATUS: 0 - Asymptomatic  There were no vitals filed for this visit. There were no vitals filed for this visit.  Physical Exam  Constitutional: She is oriented to person, place, and time and well-developed, well-nourished, and in no distress.  HENT:  Head: Normocephalic and atraumatic.  Mouth/Throat: Oropharynx is clear and moist. No oropharyngeal exudate.  Eyes: Pupils are equal, round, and reactive to light.  Neck: Normal range of motion. Neck supple.  Cardiovascular: Normal rate and regular rhythm.  Pulmonary/Chest: No respiratory distress. She has no wheezes.  Abdominal: Soft. Bowel sounds are normal. She exhibits no distension and no mass. There is no tenderness. There is no rebound and no guarding.  Musculoskeletal: Normal range of motion. She exhibits no edema or tenderness.  Neurological: She is alert and oriented to person, place, and time.  Skin: Skin is warm.  Psychiatric: Affect normal.     LABORATORY DATA:  I have reviewed the data as listed Lab Results  Component Value Date   WBC 5.1 05/15/2018   HGB 16.1 (H) 05/15/2018   HCT 47.7 (H) 05/15/2018   MCV 91 05/15/2018   PLT 178 05/15/2018   Recent Labs    01/10/18 0803 05/15/18 0930  NA 139 144  K 3.9 4.5  CL 106 101  CO2 24 26  GLUCOSE 97 71  BUN 14 11  CREATININE 0.73 0.77  CALCIUM 9.1 9.8  GFRNONAA >60 83  GFRAA >60 96  PROT 7.1 7.0  ALBUMIN 4.3 4.7  AST 34 21  ALT 29 26  ALKPHOS 100 125*  BILITOT 1.1 0.6   Results  for Tammy Lam (MRN [id number]) as of 01/18/2017 09:33  Ref. 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  #   Asymptomatic without any concerns for iron overload.  Recommend phlebotomy if ferritin >1000; saturation given 60.  Stable.  # Left neck/shouler pain/ arm pain- chronic-stable.  #Again reviewed with the patient  to avoid iron supplementation.  # Keep appt for may 2020; labs 1 week prior/  #No phlebotomy today.  Cc: Ms. Tyler Pita, MD 05/30/2018 7:26 AM

## 2018-05-30 ENCOUNTER — Ambulatory Visit: Payer: Medicaid Other | Admitting: Adult Health

## 2018-05-30 ENCOUNTER — Encounter: Payer: Self-pay | Admitting: Adult Health

## 2018-05-30 VITALS — BP 132/80 | HR 64 | Resp 16 | Ht 65.0 in | Wt 136.6 lb

## 2018-05-30 DIAGNOSIS — R6881 Early satiety: Secondary | ICD-10-CM | POA: Diagnosis not present

## 2018-05-30 DIAGNOSIS — E782 Mixed hyperlipidemia: Secondary | ICD-10-CM | POA: Diagnosis not present

## 2018-05-30 NOTE — Progress Notes (Signed)
[facility] [address] [place], [place] [address]  Internal MEDICINE  Office Visit Note  Patient Name: Tammy Lam  [id number]  [id number]  Date of Service: 05/30/2018  Chief Complaint  Patient presents with  . Hyperlipidemia    follow up labs and ugi  . Abdominal Pain    its better     HPI  Pt here for follow up on HLD, Hemachromatosis and abdominal pain.  Also she is following up on Upper GI, abdominal US and lab work that were performed.  Results of Upper GI and lab work were reviewed.  The results of the ulrtasound were not available at this visit.  The patient reports her abdominal pain has mostly resolved.  She denies issues with Bowel movements.  She is concerned about her cholesterol because she has been taking medication and the level has  Not been checked recently.     Current Medication: Outpatient Encounter Medications as of 05/30/2018  Medication Sig  . diclofenac sodium (VOLTAREN) 1 % GEL Apply topically 4 (four) times daily.  . nortriptyline (PAMELOR) 10 MG capsule Take 10 mg by mouth at bedtime.  . [DISCONTINUED] diclofenac sodium (VOLTAREN) 1 % GEL Apply topically.  . fenofibrate (TRICOR) 145 MG tablet Take 1 tablet by mouth daily.  Marland Kitchen gabapentin (NEURONTIN) 100 MG capsule Take 1 capsule by mouth 4 (four) times daily.  Marland Kitchen ibuprofen (ADVIL,MOTRIN) 600 MG tablet Take by mouth.   No facility-administered encounter medications on file as of 05/30/2018.     Surgical History: Past Surgical History:  Procedure Laterality Date  . ANKLE SURGERY Right     Medical History: Past Medical History:  Diagnosis Date  . Elevated ferritin   . Headache   . Hemochromatosis   . Hyperlipidemia   . Hyponatremia   . Medical history non-contributory   . Shoulder injury     Family History: Family History  Problem Relation Age of Onset  . Liver disease Unknown     Social History   Socioeconomic History  . Marital status: Married    Spouse name: Not on file   . Number of children: Not on file  . Years of education: Not on file  . Highest education level: Not on file  Occupational History  . Not on file  Social Needs  . Financial resource strain: Not on file  . Food insecurity:    Worry: Not on file    Inability: Not on file  . Transportation needs:    Medical: Not on file    Non-medical: Not on file  Tobacco Use  . Smoking status: Never Smoker  . Smokeless tobacco: Never Used  Substance and Sexual Activity  . Alcohol use: No    Alcohol/week: 0.0 standard drinks  . Drug use: No  . Sexual activity: Yes    Partners: Male  Lifestyle  . Physical activity:    Days per week: Not on file    Minutes per session: Not on file  . Stress: Not on file  Relationships  . Social connections:    Talks on phone: Not on file    Gets together: Not on file    Attends religious service: Not on file    Active member of club or organization: Not on file    Attends meetings of clubs or organizations: Not on file    Relationship status: Not on file  . Intimate partner violence:    Fear of current or ex partner: Not on file  Emotionally abused: Not on file    Physically abused: Not on file    Forced sexual activity: Not on file  Other Topics Concern  . Not on file  Social History Narrative  . Not on file      Review of Systems  Constitutional: Negative for chills, fatigue and unexpected weight change.  HENT: Negative for congestion, rhinorrhea, sneezing and sore throat.   Eyes: Negative for photophobia, pain and redness.  Respiratory: Negative for cough, chest tightness and shortness of breath.   Cardiovascular: Negative for chest pain and palpitations.  Gastrointestinal: Negative for abdominal pain, constipation, diarrhea, nausea and vomiting.  Endocrine: Negative.   Genitourinary: Negative for dysuria and frequency.  Musculoskeletal: Negative for arthralgias, back pain, joint swelling and neck pain.  Skin: Negative for rash.   Allergic/Immunologic: Negative.   Neurological: Negative for tremors and numbness.  Hematological: Negative for adenopathy. Does not bruise/bleed easily.  Psychiatric/Behavioral: Negative for behavioral problems and sleep disturbance. The patient is not nervous/anxious.     Vital Signs: BP 132/80   Pulse 64   Resp 16   Ht 5\' 5"  (1.651 m)   Wt 136 lb 9.6 oz (62 kg)   LMP  (LMP Unknown)   SpO2 94%   BMI 22.73 kg/m    Physical Exam  Constitutional: She is oriented to person, place, and time. She appears well-developed and well-nourished. No distress.  HENT:  Head: Normocephalic and atraumatic.  Mouth/Throat: Oropharynx is clear and moist. No oropharyngeal exudate.  Eyes: Pupils are equal, round, and reactive to light. EOM are normal.  Neck: Normal range of motion. Neck supple. No JVD present. No tracheal deviation present. No thyromegaly present.  Cardiovascular: Normal rate, regular rhythm and normal heart sounds. Exam reveals no gallop and no friction rub.  No murmur heard. Pulmonary/Chest: Effort normal and breath sounds normal. No respiratory distress. She has no wheezes. She has no rales. She exhibits no tenderness.  Abdominal: Soft. There is no tenderness. There is no guarding.  Musculoskeletal: Normal range of motion.  Lymphadenopathy:    She has no cervical adenopathy.  Neurological: She is alert and oriented to person, place, and time. No cranial nerve deficit.  Skin: Skin is warm and dry. She is not diaphoretic.  Psychiatric: She has a normal mood and affect. Her behavior is normal. Judgment and thought content normal.  Nursing note and vitals reviewed.   Assessment/Plan: 1. Mixed hyperlipidemia Will check Lipid panel, and follow up with results.  - Lipid Panel With LDL/HDL Ratio  2. Hereditary hemochromatosis (HCC) Pt seen by oncology.  They will follow her.  Due to Ferritin level being less than 1000, and asymptomatic patient they will wait and follow the  patient for treatment/intervention.  3. Early satiety Resolved.  Upper GI negative.  If symptoms return.  Will send patient to GI for EGD.  General Counseling: Tammy Lam verbalizes understanding of the findings of todays visit and agrees with plan of treatment. I have discussed any further diagnostic evaluation that may be needed or ordered today. We also reviewed her medications today. she has been encouraged to call the office with any questions or concerns that should arise related to todays visit.    Orders Placed This Encounter  Procedures  . Lipid Panel With LDL/HDL Ratio    No orders of the defined types were placed in this encounter.   Time spent: 25 Minutes   This patient was seen by Blima Ledger AGNP-C in Collaboration with Dr Lyndon Code  as a part of collaborative care agreement    Blima Ledger AGNP-C Internal medicine

## 2018-05-31 ENCOUNTER — Ambulatory Visit: Payer: Medicaid Other

## 2018-05-31 DIAGNOSIS — M542 Cervicalgia: Secondary | ICD-10-CM

## 2018-05-31 DIAGNOSIS — M6281 Muscle weakness (generalized): Secondary | ICD-10-CM | POA: Diagnosis not present

## 2018-05-31 DIAGNOSIS — M79632 Pain in left forearm: Secondary | ICD-10-CM

## 2018-05-31 DIAGNOSIS — M79642 Pain in left hand: Secondary | ICD-10-CM

## 2018-05-31 DIAGNOSIS — M5412 Radiculopathy, cervical region: Secondary | ICD-10-CM

## 2018-05-31 DIAGNOSIS — M25512 Pain in left shoulder: Secondary | ICD-10-CM

## 2018-05-31 DIAGNOSIS — G8929 Other chronic pain: Secondary | ICD-10-CM

## 2018-05-31 DIAGNOSIS — M79602 Pain in left arm: Secondary | ICD-10-CM

## 2018-05-31 DIAGNOSIS — M25522 Pain in left elbow: Secondary | ICD-10-CM

## 2018-05-31 NOTE — Patient Instructions (Signed)
Pt was recommended to have her husband massage the back of her L shoulder and arm at home to help with progress. Pt verbalized understanding.

## 2018-05-31 NOTE — Therapy (Signed)
[facility] [address], [place], [address] Phone: [phone]   Fax:  [phone]  Physical Therapy Treatment  Patient Details  Name: Tammy Lam MRN: [id number] Date of Birth: 08-20-1956 Referring Provider (PT): Gurney Maxin, MD   Encounter Date: 05/31/2018  PT End of Session - 05/31/18 1353    Visit Number  26    Number of Visits  28    Date for PT Re-Evaluation  06/22/18    Authorization Type  8 of 12    Authorization Time Period  Medicaid    PT Start Time  9935    PT Stop Time  1435    PT Time Calculation (min)  41 min    Activity Tolerance  Patient limited by pain    Behavior During Therapy  [facility] for tasks assessed/performed       Past Medical History:  Diagnosis Date  . Elevated ferritin   . Headache   . Hemochromatosis   . Hyperlipidemia   . Hyponatremia   . Medical history non-contributory   . Shoulder injury     Past Surgical History:  Procedure Laterality Date  . ANKLE SURGERY Right     There were no vitals filed for this visit.  Subjective Assessment - 05/31/18 1354    Subjective  Feels increased pain probably from the band exercise from last session. Difficulty raising and moving her L UE today. 3.5/10 L UE currently.     Pertinent History  L arm and shoulder pain: 16 years ago when pt was at work, pt had to lift heavy items. Pt had electric pad treatment. Also went to PT and for some reason, she could not do exercises.   Pt could not move her arm.  2 years ago pt started taking medication.  1 month ago pt started taking injections.  Medication does help however the injection helps with the pain but also has pain in her elbow to her hand and the back of her neck.  Feels pain from elbow down her whole arm and hand. Feels a lot of pain on the palm of her L hand (pt observed to rub her hand a lot along the L C7/C8 dermatome on her hand).  Denies loss of bowel or bladder control. When she  moves her L hand, her pain also affects her head down to her feet on the L side.  Doctor just gave her injections, no mention of surgery.  Also has pain in the L side of her tongue, eye, face.  Pt states that the neurologist knows about it.  Does not think she can exercise her L UE due to pain.  Last PT was 15-16 years ago.   Pt is L hand dominant.     Currently in Pain?  Yes    Pain Score  4    3.5/10                              PT Education - 05/31/18 1412    Education provided  Yes    Education Details  ther-ex    Person(s) Educated  Patient    Methods  Explanation;Demonstration;Tactile cues;Verbal cues    Comprehension  Verbalized understanding;Returned demonstration          Objectives  Interpreter Kim    Manual therapy  Seated STM around C4/5 area.   Seated STMbilateralUTmuscles  Seated STM to distal L  scalene and first rib area   Seated STM L teres major  Improved ability to raise her L arm up and perform elbow flexion  Seated STM L triceps  Increased ability to perform L shoulder flexion   Therapeutic exercise   seated bilateral scapular retraction 5x5 seconds for 3 sets              Seated manually resisted L scapular retraction isometrics 5x5 seconds for 3 sets  Standing L scapular depression isometrics 5x5 seconds for 3 sets  Then with gentle L bicep curls  Improved exercise technique, movement at target joints, use of target muscles after min to mod verbal, visual, tactile cues.    Pt demonstrates difficulty with L shoulder and elbow flexion and making a fist today. Improved ability to perform aforementioned actions following manual therapy to decrease muscle tension around the axillary and radial nerves of her L arm. Decreased pain following treatment to decrease tension to her cervical paraspinal, upper trap, first rib area, teres major, and triceps muscles. Pt will benefit from continued skilled physical  therapy services to decrease pain, improve ability to raise her L arm up, and improve ability to perform functional tasks.         PT Long Term Goals - 03/28/18 1208      PT LONG TERM GOAL #1   Title  Pt will have a decrease in L cervical and L UE pain to 5/10 or less at worst to promote ability to use L UE to perform functional tasks.     Baseline  8/10 at worst. (10/25/2017), (12/12/2017); 4/10 at worst for the past 7 days (01/31/2018); 5/10 pain at worst for the past 7 days (03/28/2018)    Time  12    Period  Weeks    Status  Partially Met    Target Date  06/22/18      PT LONG TERM GOAL #2   Title  Pt will improve her L UE strength by at least 1/2 MMT grade to promote ability to perform functional tasks.     Baseline  Left: shoulder flexion 2+/5, elbow flexion 2+/5, wrist extension 2+/5 (10/25/2017); Not tested today secondary to pt stating pain everytime she lifts her L arm (12/12/2017); L shoulder flexion 3-/5 with anterior shoulder pain, elbow flexion 3-/5, wrist extension 3-/5 (01/31/2018)    Time  6    Period  Weeks    Status  Achieved      PT LONG TERM GOAL #3   Title  Patient will improve her FOTO score by at least 10 points as a demonstration of improved function.     Baseline  4 (filled out with husband's help 10/25/2017); 32 (with interpreter's help, 12/12/2017); 20 (with interpreter's help, 01/31/2018);  16 (with interpreter's help, 03/28/2018)    Time  4    Period  Weeks    Status  Achieved      PT LONG TERM GOAL #4   Title  Patient will improve her Quick Dash score by at least 10% as a demonnstration of improved function.     Baseline  88.6% (filled out with husband's help 10/25/2017); 63.6% (with interpreter's help 12/12/2017); 79.5% (with interpreter's help, 01/31/2018); 65.9% wiht interpreter's help (03/28/2018)    Time  9    Period  Weeks    Status  Achieved    Target Date  04/06/18      PT LONG TERM GOAL #5   Title  Patient will improve her FOTO score  to at least 42 points as  a demonstration of improved function.     Baseline  32 (12/12/2017); 20 (with interpreter's help, 01/31/2018); 16 (with interpreter's help)    Time  12    Period  Weeks    Status  On-going    Target Date  06/22/18      PT LONG TERM GOAL #6   Title  Patient will improve her Quick Dash score to least 53% or less as a demonnstration of improved function.     Baseline  63% (12/12/2017); 79.5% (with interpreter's help, 01/31/2018); 65.9% (with interpreter's help)    Time  12    Period  Weeks    Status  On-going    Target Date  06/22/18      PT LONG TERM GOAL #7   Title  Patient will improve L UE strength to 3/5 or more MMT grade for L UE to promote ability to perform functional tasks.     Baseline  L shoulder flexion 3-/5 with anterior shoulder pain, elbow flexion 3-/5, wrist extension 3-/5 (01/31/2018), (03/28/2018)    Time  12    Period  Weeks    Status  On-going    Target Date  06/22/18            Plan - 05/31/18 1414    Rehab Potential  Fair    Clinical Impairments Affecting Rehab Potential  Chronicity of condition, multiple areas involved, language barrier, pain, weakness, difficulty using her L UE for tasks    PT Frequency  2x / week    PT Duration  12 weeks    PT Treatment/Interventions  Manual techniques;Patient/family education;Therapeutic activities;Therapeutic exercise;Passive range of motion;Dry needling;Neuromuscular re-education;Ultrasound;Electrical Stimulation;Iontophoresis 69m/ml Dexamethasone;Aquatic Therapy    PT Next Visit Plan  manual techniques, ROM, gentle strengthening, modalities as needed    Consulted and Agree with Plan of Care  Patient    Family Member Consulted  husband       Patient will benefit from skilled therapeutic intervention in order to improve the following deficits and impairments:  Pain, Postural dysfunction, Improper body mechanics, Impaired UE functional use, Decreased strength, Decreased range of motion  Visit Diagnosis: No diagnosis  found.     Problem List Patient Active Problem List   Diagnosis Date Noted  . Abnormal brain scan 02/15/2017  . Inflammation of shoulder joint 02/15/2017  . Neck pain 02/15/2017  . Hemochromatosis, hereditary ([facility] 01/18/2017  . Hypertriglyceridemia 11/13/2016  . Blurry vision 05/24/2016  . Chronic tension-type headache, not intractable 05/24/2016  . Acute headache 04/19/2016  . Left arm pain 04/19/2016  . Rotator cuff tear 03/08/2016  . Carpal tunnel syndrome 03/08/2016  . Cervical radiculitis 03/08/2016    Darnel Mchan 05/31/2018, 2:44 PM  [facility] [address] [place] [id number]: [phone]  Fax:  [phone] Name: Tammy NorthropMRN: [id number] of Birth: 4July 03, 1957

## 2018-06-06 ENCOUNTER — Ambulatory Visit: Payer: Medicaid Other

## 2018-06-06 DIAGNOSIS — G8929 Other chronic pain: Secondary | ICD-10-CM

## 2018-06-06 DIAGNOSIS — M6281 Muscle weakness (generalized): Secondary | ICD-10-CM

## 2018-06-06 DIAGNOSIS — M79602 Pain in left arm: Secondary | ICD-10-CM

## 2018-06-06 DIAGNOSIS — M25512 Pain in left shoulder: Secondary | ICD-10-CM

## 2018-06-06 DIAGNOSIS — M25522 Pain in left elbow: Secondary | ICD-10-CM

## 2018-06-06 DIAGNOSIS — M79632 Pain in left forearm: Secondary | ICD-10-CM

## 2018-06-06 DIAGNOSIS — M5412 Radiculopathy, cervical region: Secondary | ICD-10-CM

## 2018-06-06 DIAGNOSIS — M79642 Pain in left hand: Secondary | ICD-10-CM

## 2018-06-06 DIAGNOSIS — M542 Cervicalgia: Secondary | ICD-10-CM

## 2018-06-06 NOTE — Patient Instructions (Signed)
Pt was recommended to have her husband massage the back of her neck, B upper trap muscles, and L UE. Pt verbalized understanding.

## 2018-06-06 NOTE — Therapy (Signed)
[facility] [address] [place], [address] Phone: [phone]   Fax:  [phone]  Physical Therapy Treatment  Patient Details  Name: Tammy Lam MRN: [id number] Date of Birth: Nov 12, 1955 Referring Provider (PT): Gurney Maxin, MD   Encounter Date: 06/06/2018  PT End of Session - 06/06/18 1037    Visit Number  27    Number of Visits  28    Date for PT Re-Evaluation  06/22/18    Authorization Type  9 of 12    Authorization Time Period  Medicaid    PT Start Time  1037    PT Stop Time  1118    PT Time Calculation (min)  41 min    Activity Tolerance  Patient limited by pain    Behavior During Therapy  [facility] for tasks assessed/performed       Past Medical History:  Diagnosis Date  . Elevated ferritin   . Headache   . Hemochromatosis   . Hyperlipidemia   . Hyponatremia   . Medical history non-contributory   . Shoulder injury     Past Surgical History:  Procedure Laterality Date  . ANKLE SURGERY Right     There were no vitals filed for this visit.  Subjective Assessment - 06/06/18 1045    Subjective  The L side is hurting right now. 3.5/10 currently. Both arms were sore after last session. Better today.     Pertinent History  L arm and shoulder pain: 16 years ago when pt was at work, pt had to lift heavy items. Pt had electric pad treatment. Also went to PT and for some reason, she could not do exercises.   Pt could not move her arm.  2 years ago pt started taking medication.  1 month ago pt started taking injections.  Medication does help however the injection helps with the pain but also has pain in her elbow to her hand and the back of her neck.  Feels pain from elbow down her whole arm and hand. Feels a lot of pain on the palm of her L hand (pt observed to rub her hand a lot along the L C7/C8 dermatome on her hand).  Denies loss of bowel or bladder control. When she moves her L hand, her pain also  affects her head down to her feet on the L side.  Doctor just gave her injections, no mention of surgery.  Also has pain in the L side of her tongue, eye, face.  Pt states that the neurologist knows about it.  Does not think she can exercise her L UE due to pain.  Last PT was 15-16 years ago.   Pt is L hand dominant.     Currently in Pain?  Yes    Pain Score  4    3.5/10                              PT Education - 06/06/18 1256    Education provided  Yes    Education Details  manual therapy    Person(s) Educated  Patient    Methods  Explanation    Comprehension  Verbalized understanding          Objectives  Interpreter: Language line: [phone]  Time taken for phone call to get interpreter (was on hold). Started interpretation at 10:44 am  Got verbal permission to place phone on speaker  Kim interpreter arrived at end of session (got stuck in traffic)    Manual therapy   Seated STM L teres major           Seated STM L triceps          Seated STM L supinator muscle   Pt states the pain in L arm is less after aforementioned treatment    Seated STM around C4/5 area.   Seated STMbilateralUTmuscles  Seated STM to distal L scalene and first rib area   Neck and arm feel much better afterward and better able to raise her L arm up. Decreased soreness. Able to open her fingers afterwards.     Decreased neck and L UE pain after manual therapy to decrease tension posterior neck, B upper trap, L teres major, triceps and supinator muscle. Pt states better able to open her L hand and better able to raise her L arm up after session.      PT Long Term Goals - 03/28/18 1208      PT LONG TERM GOAL #1   Title  Pt will have a decrease in L cervical and L UE pain to 5/10 or less at worst to promote ability to use L UE to perform functional tasks.     Baseline  8/10 at worst. (10/25/2017), (12/12/2017); 4/10 at worst for the past 7 days  (01/31/2018); 5/10 pain at worst for the past 7 days (03/28/2018)    Time  12    Period  Weeks    Status  Partially Met    Target Date  06/22/18      PT LONG TERM GOAL #2   Title  Pt will improve her L UE strength by at least 1/2 MMT grade to promote ability to perform functional tasks.     Baseline  Left: shoulder flexion 2+/5, elbow flexion 2+/5, wrist extension 2+/5 (10/25/2017); Not tested today secondary to pt stating pain everytime she lifts her L arm (12/12/2017); L shoulder flexion 3-/5 with anterior shoulder pain, elbow flexion 3-/5, wrist extension 3-/5 (01/31/2018)    Time  6    Period  Weeks    Status  Achieved      PT LONG TERM GOAL #3   Title  Patient will improve her FOTO score by at least 10 points as a demonstration of improved function.     Baseline  4 (filled out with husband's help 10/25/2017); 32 (with interpreter's help, 12/12/2017); 20 (with interpreter's help, 01/31/2018);  16 (with interpreter's help, 03/28/2018)    Time  4    Period  Weeks    Status  Achieved      PT LONG TERM GOAL #4   Title  Patient will improve her Quick Dash score by at least 10% as a demonnstration of improved function.     Baseline  88.6% (filled out with husband's help 10/25/2017); 63.6% (with interpreter's help 12/12/2017); 79.5% (with interpreter's help, 01/31/2018); 65.9% wiht interpreter's help (03/28/2018)    Time  9    Period  Weeks    Status  Achieved    Target Date  04/06/18      PT LONG TERM GOAL #5   Title  Patient will improve her FOTO score to at least 42 points as a demonstration of improved function.     Baseline  32 (12/12/2017); 20 (with interpreter's help, 01/31/2018); 16 (with interpreter's help)    Time  12    Period  Weeks    Status  On-going    Target Date  06/22/18      PT LONG TERM GOAL #6   Title  Patient will improve her Quick Dash score to least 53% or less as a demonnstration of improved function.     Baseline  63% (12/12/2017); 79.5% (with interpreter's help, 01/31/2018);  65.9% (with interpreter's help)    Time  12    Period  Weeks    Status  On-going    Target Date  06/22/18      PT LONG TERM GOAL #7   Title  Patient will improve L UE strength to 3/5 or more MMT grade for L UE to promote ability to perform functional tasks.     Baseline  L shoulder flexion 3-/5 with anterior shoulder pain, elbow flexion 3-/5, wrist extension 3-/5 (01/31/2018), (03/28/2018)    Time  12    Period  Weeks    Status  On-going    Target Date  06/22/18            Plan - 06/06/18 1040    Clinical Impression Statement  Decreased neck and L UE pain after manual therapy to decrease tension posterior neck, B upper trap, L teres major, triceps and supinator muscle. Pt states better able to open her L hand and better able to raise her L arm up after session.     Rehab Potential  Fair    Clinical Impairments Affecting Rehab Potential  Chronicity of condition, multiple areas involved, language barrier, pain, weakness, difficulty using her L UE for tasks    PT Frequency  2x / week    PT Duration  12 weeks    PT Treatment/Interventions  Manual techniques;Patient/family education;Therapeutic activities;Therapeutic exercise;Passive range of motion;Dry needling;Neuromuscular re-education;Ultrasound;Electrical Stimulation;Iontophoresis 76m/ml Dexamethasone;Aquatic Therapy    PT Next Visit Plan  manual techniques, ROM, gentle strengthening, modalities as needed    Consulted and Agree with Plan of Care  Patient    Family Member Consulted  husband       Patient will benefit from skilled therapeutic intervention in order to improve the following deficits and impairments:  Pain, Postural dysfunction, Improper body mechanics, Impaired UE functional use, Decreased strength, Decreased range of motion  Visit Diagnosis: Muscle weakness (generalized)  Radiculopathy, cervical region  Cervicalgia  Pain in left arm  Pain in left elbow  Pain in left hand  Chronic left shoulder pain  Pain  in left forearm     Problem List Patient Active Problem List   Diagnosis Date Noted  . Abnormal brain scan 02/15/2017  . Inflammation of shoulder joint 02/15/2017  . Neck pain 02/15/2017  . Hemochromatosis, [place] 01/18/2017  . Hypertriglyceridemia 11/13/2016  . Blurry vision 05/24/2016  . Chronic tension-type headache, not intractable 05/24/2016  . Acute headache 04/19/2016  . Left arm pain 04/19/2016  . Rotator cuff tear 03/08/2016  . Carpal tunnel syndrome 03/08/2016  . Cervical radiculitis 03/08/2016    MJoneen BoersPT, DPT   06/06/2018, 1:04 PM  [facility] [address] [place] [address]Phone: [phone]  Fax:  [phone] Name: Tammy InscoMRN: [id number] of Birth: [birth year]

## 2018-06-08 ENCOUNTER — Ambulatory Visit: Payer: Medicaid Other

## 2018-06-08 DIAGNOSIS — M25522 Pain in left elbow: Secondary | ICD-10-CM

## 2018-06-08 DIAGNOSIS — M79632 Pain in left forearm: Secondary | ICD-10-CM

## 2018-06-08 DIAGNOSIS — M6281 Muscle weakness (generalized): Secondary | ICD-10-CM

## 2018-06-08 DIAGNOSIS — M542 Cervicalgia: Secondary | ICD-10-CM

## 2018-06-08 DIAGNOSIS — G8929 Other chronic pain: Secondary | ICD-10-CM

## 2018-06-08 DIAGNOSIS — M79602 Pain in left arm: Secondary | ICD-10-CM

## 2018-06-08 DIAGNOSIS — M25512 Pain in left shoulder: Secondary | ICD-10-CM

## 2018-06-08 DIAGNOSIS — M5412 Radiculopathy, cervical region: Secondary | ICD-10-CM

## 2018-06-08 DIAGNOSIS — M79642 Pain in left hand: Secondary | ICD-10-CM

## 2018-06-08 NOTE — Therapy (Addendum)
[facility] [address] [place], [address] Phone: [phone]   Fax:  [phone]  Physical Therapy Treatment And Discharge Summary  Patient Details  Name: Tammy Lam MRN: [id number] Date of Birth: 06-09-1956 Referring Provider (PT): Gurney Maxin, MD   Encounter Date: 06/08/2018  PT End of Session - 06/08/18 0951    Visit Number  28    Number of Visits  28    Date for PT Re-Evaluation  06/22/18    Authorization Type  --    Authorization Time Period  Medicaid    PT Start Time  [phone]    PT Stop Time  1035    PT Time Calculation (min)  44 min    Activity Tolerance  Patient limited by pain    Behavior During Therapy  [facility] for tasks assessed/performed       Past Medical History:  Diagnosis Date  . Elevated ferritin   . Headache   . Hemochromatosis   . Hyperlipidemia   . Hyponatremia   . Medical history non-contributory   . Shoulder injury     Past Surgical History:  Procedure Laterality Date  . ANKLE SURGERY Right     There were no vitals filed for this visit.  Subjective Assessment - 06/08/18 0953    Subjective  The weather change increased her pain. 3.5/10 currently. Her husband massages her and she feels better. 4/10 at most for the past 7 days.  She can close her hands now. Still has a hard time using her L hand at home. Used to hold chopstics with L hand, now has to use a spoon.  Now only takes 1 pain medication as needed. Feels better. Can laugh and smile more.     Pertinent History  L arm and shoulder pain: 16 years ago when pt was at work, pt had to lift heavy items. Pt had electric pad treatment. Also went to PT and for some reason, she could not do exercises.   Pt could not move her arm.  2 years ago pt started taking medication.  1 month ago pt started taking injections.  Medication does help however the injection helps with the pain but also has pain in her elbow to her hand and the back  of her neck.  Feels pain from elbow down her whole arm and hand. Feels a lot of pain on the palm of her L hand (pt observed to rub her hand a lot along the L C7/C8 dermatome on her hand).  Denies loss of bowel or bladder control. When she moves her L hand, her pain also affects her head down to her feet on the L side.  Doctor just gave her injections, no mention of surgery.  Also has pain in the L side of her tongue, eye, face.  Pt states that the neurologist knows about it.  Does not think she can exercise her L UE due to pain.  Last PT was 15-16 years ago.   Pt is L hand dominant.     Currently in Pain?  Yes    Pain Score  4    3.5/10        [facility] PT Assessment - 06/08/18 0001      Observation/Other Assessments   Focus on Therapeutic Outcomes (FOTO)   27   measured on 05/25/2018   Quick DASH   79.5%   measured on 05/25/2018     Strength   Left Shoulder  Flexion  2+/5    Left Elbow Flexion  3-/5    Left Wrist Extension  3-/5                           PT Education - 06/08/18 1050    Education provided  Yes    Education Details  manual therapy, ther-ex, plan of care: D/C secondary to insurance.     Person(s) Educated  Patient    Methods  Explanation;Demonstration;Tactile cues;Verbal cues   interpreter present   Comprehension  Returned demonstration;Verbalized understanding         Objectives  Interpreter:  Kim  Pt was recommended to use heating pad to help decrease muscle tension. 15 minutes at a time. Pt verbalized understanding.   Manual therapy   Seated STM L teres major  Seated STM L triceps  Seated STM L supinator muscle   Pt states the pain in L arm is less after aforementioned treatment    Seated STM around C4/5 area.   Seated STMbilateralUTmuscles   Neck and arm feel much better afterwards per pt.     Therapeutic exercise  L shoulder flexion (76 degrees) 2+/5, L elbow flexion (90 degrees) 3-/5, L wrist  extension 3-/5  L scapular retraction targeting lower trap muscle 10x5 seconds isometrics, PT manual resistance  L scapular depression manual resistance 10x5 seconds for 2 sets. HEP  Increased time secondary to interpretation  Improved exercise technique, movement at target joints, use of target muscles after min to mod verbal, visual, tactile cues.      Pt demonstrates overall decreased L neck and L UE pain with worst pain level significantly decreasing from 8/10 at worst to 4/10 at worst for the past 7 days, overall improved L UE strength and function since initial evaluation. Pt also demonstrates good ability to perform exercises at home with her husband as well as her husband being able to massage her muscles to help maintain and continue her progress based on her subjective reports. Skilled physical therapy services discharged secondary to good progress with pain level as well as due to insurance with patient maintaining/continuing progress with her exercises at home. Per patient, she feels better, can laugh and smile more.      PT Long Term Goals - 06/08/18 1052      PT LONG TERM GOAL #1   Title  Pt will have a decrease in L cervical and L UE pain to 5/10 or less at worst to promote ability to use L UE to perform functional tasks.     Baseline  8/10 at worst. (10/25/2017), (12/12/2017); 4/10 at worst for the past 7 days (01/31/2018); 5/10 pain at worst for the past 7 days (03/28/2018); 4/10 at worst for the past 7 days (06/08/2018)    Time  12    Period  Weeks    Status  Achieved    Target Date  06/22/18      PT LONG TERM GOAL #2   Title  Pt will improve her L UE strength by at least 1/2 MMT grade to promote ability to perform functional tasks.     Baseline  Left: shoulder flexion 2+/5, elbow flexion 2+/5, wrist extension 2+/5 (10/25/2017); Not tested today secondary to pt stating pain everytime she lifts her L arm (12/12/2017); L shoulder flexion 3-/5 with anterior shoulder pain, elbow  flexion 3-/5, wrist extension 3-/5 (01/31/2018); 2+/5 L shoulder flexion, 3-/5 L elbow flexion and wrist extension (06/08/2018)  Time  6    Period  Weeks    Status  Partially Met      PT LONG TERM GOAL #3   Title  Patient will improve her FOTO score by at least 10 points as a demonstration of improved function.     Baseline  4 (filled out with husband's help 10/25/2017); 32 (with interpreter's help, 12/12/2017); 20 (with interpreter's help, 01/31/2018);  16 (with interpreter's help, 03/28/2018)    Time  4    Period  Weeks    Status  Achieved      PT LONG TERM GOAL #4   Title  Patient will improve her Quick Dash score by at least 10% as a demonnstration of improved function.     Baseline  88.6% (filled out with husband's help 10/25/2017); 63.6% (with interpreter's help 12/12/2017); 79.5% (with interpreter's help, 01/31/2018); 65.9% wiht interpreter's help (03/28/2018); 79.5% (with interpreter's help) 06/08/2018     Time  9    Period  Weeks    Status  Partially Met      PT LONG TERM GOAL #5   Title  Patient will improve her FOTO score to at least 42 points as a demonstration of improved function.     Baseline  32 (12/12/2017); 20 (with interpreter's help, 01/31/2018); 16 (with interpreter's help); 27 (with interpreter's help) 06/08/2018    Time  12    Period  Weeks    Status  On-going    Target Date  06/22/18      PT LONG TERM GOAL #6   Title  Patient will improve her Quick Dash score to least 53% or less as a demonnstration of improved function.     Baseline  63% (12/12/2017); 79.5% (with interpreter's help, 01/31/2018); 65.9% (with interpreter's help); 79.5% (with interpreter's help) 06/08/2018    Time  12    Period  Weeks    Status  On-going    Target Date  06/22/18      PT LONG TERM GOAL #7   Title  Patient will improve L UE strength to 3/5 or more MMT grade for L UE to promote ability to perform functional tasks.     Baseline  L shoulder flexion 3-/5 with anterior shoulder pain, elbow  flexion 3-/5, wrist extension 3-/5 (01/31/2018), (03/28/2018);  L shoulder flexion 2+/5, L elbow flexion 3-/5, L wrist extension 3-/5 (06/08/2018)    Time  12    Period  Weeks    Status  On-going    Target Date  06/22/18            Plan - 06/08/18 1051    Clinical Impression Statement  Pt demonstrates overall decreased L neck and L UE pain with worst pain level significantly decreasing from 8/10 at worst to 4/10 at worst for the past 7 days, overall improved L UE strength and function since initial evaluation. Pt also demonstrates good ability to perform exercises at home with her husband as well as her husband being able to massage her muscles to help maintain and continue her progress based on her subjective reports. Skilled physical therapy services discharged secondary to good progress with pain level as well as due to insurance with patient maintaining/continuing progress with her exercises at home. Per patient, she feels better, can laugh and smile more.     History and Personal Factors relevant to plan of care:  Chronicity of condition, multiple areas involved, language barrier, pain, weakness, difficulty using her L UE for tasks.  Clinical Presentation  Stable    Clinical Presentation due to:  significant decrease in pain since initial evaluation    Clinical Decision Making  Low    Rehab Potential  Fair    Clinical Impairments Affecting Rehab Potential  Chronicity of condition, multiple areas involved, language barrier, pain, weakness, difficulty using her L UE for tasks    PT Frequency  --    PT Duration  --    PT Treatment/Interventions  Manual techniques;Patient/family education;Therapeutic activities;Therapeutic exercise;Neuromuscular re-education    PT Next Visit Plan  Continue progress with her exercises at home    Consulted and Agree with Plan of Care  Patient   Interpreter present   Family Member Consulted  --       Patient will benefit from skilled therapeutic  intervention in order to improve the following deficits and impairments:  Pain, Postural dysfunction, Improper body mechanics, Impaired UE functional use, Decreased strength, Decreased range of motion  Visit Diagnosis: Muscle weakness (generalized)  Radiculopathy, cervical region  Cervicalgia  Pain in left arm  Pain in left elbow  Pain in left hand  Chronic left shoulder pain  Pain in left forearm     Problem List Patient Active Problem List   Diagnosis Date Noted  . Abnormal brain scan 02/15/2017  . Inflammation of shoulder joint 02/15/2017  . Neck pain 02/15/2017  . 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 Hypertriglyceridemia 11/13/2016  . Blurry vision 05/24/2016  . Chronic tension-type headache, not intractable 05/24/2016  . Acute headache 04/19/2016  . Left arm pain 04/19/2016  . Rotator cuff tear 03/08/2016  . Carpal tunnel syndrome 03/08/2016  . Cervical radiculitis 03/08/2016   Thank you for your referral.  Joneen Boers PT, DPT   06/08/2018, 11:13 AM  [facility] [facility] [address] [place], [address] Phone: [phone]   Fax:  [phone]  Name: Tammy Lam MRN: [id number] Date of Birth: November 17, 1955

## 2018-08-31 ENCOUNTER — Ambulatory Visit: Payer: Medicaid Other | Admitting: Nurse Practitioner

## 2018-08-31 ENCOUNTER — Encounter: Payer: Self-pay | Admitting: Nurse Practitioner

## 2018-08-31 ENCOUNTER — Ambulatory Visit: Payer: Self-pay | Admitting: Adult Health

## 2018-08-31 VITALS — BP 151/80 | HR 74 | Resp 16 | Ht 65.0 in | Wt 140.0 lb

## 2018-08-31 DIAGNOSIS — M25519 Pain in unspecified shoulder: Secondary | ICD-10-CM | POA: Diagnosis not present

## 2018-08-31 DIAGNOSIS — R531 Weakness: Secondary | ICD-10-CM

## 2018-08-31 DIAGNOSIS — E782 Mixed hyperlipidemia: Secondary | ICD-10-CM

## 2018-08-31 DIAGNOSIS — Z1239 Encounter for other screening for malignant neoplasm of breast: Secondary | ICD-10-CM

## 2018-08-31 DIAGNOSIS — M25419 Effusion, unspecified shoulder: Secondary | ICD-10-CM

## 2018-08-31 NOTE — Progress Notes (Signed)
[facility] [address] [place], [place] [address]  Internal MEDICINE  Office Visit Note  Patient Name: Tammy Lam  [id number]  [id number]  Date of Service: 09/02/2018  Chief Complaint  Patient presents with  . Hyperlipidemia  . Quality Metric Gaps    pap     The patient is here for routine follow up visit. Mentioned a small mass in the upper part of the abdomen. She had ultrasound which did show localized fatty tumor with no other acute abnormalities. Will continue to monitor.       Current Medication: Outpatient Encounter Medications as of 08/31/2018  Medication Sig  . diclofenac sodium (VOLTAREN) 1 % GEL Apply topically 4 (four) times daily.  . fenofibrate (TRICOR) 145 MG tablet Take 1 tablet by mouth daily.  Marland Kitchen. gabapentin (NEURONTIN) 100 MG capsule Take 1 capsule by mouth 4 (four) times daily.  Marland Kitchen. ibuprofen (ADVIL,MOTRIN) 600 MG tablet Take by mouth.  . nortriptyline (PAMELOR) 10 MG capsule Take 10 mg by mouth at bedtime.   No facility-administered encounter medications on file as of 08/31/2018.     Surgical History: Past Surgical History:  Procedure Laterality Date  . ANKLE SURGERY Right     Medical History: Past Medical History:  Diagnosis Date  . Elevated ferritin   . Headache   . Hemochromatosis   . Hyperlipidemia   . Hyponatremia   . Medical history non-contributory   . Shoulder injury     Family History: Family History  Problem Relation Age of Onset  . Liver disease Other     Social History   Socioeconomic History  . Marital status: Married    Spouse name: Not on file  . Number of children: Not on file  . Years of education: Not on file  . Highest education level: Not on file  Occupational History  . Not on file  Social Needs  . Financial resource strain: Not on file  . Food insecurity:    Worry: Not on file    Inability: Not on file  . Transportation needs:    Medical: Not on file    Non-medical: Not on file  Tobacco Use  .  Smoking status: Never Smoker  . Smokeless tobacco: Never Used  Substance and Sexual Activity  . Alcohol use: No    Alcohol/week: 0.0 standard drinks  . Drug use: No  . Sexual activity: Yes    Partners: Male  Lifestyle  . Physical activity:    Days per week: Not on file    Minutes per session: Not on file  . Stress: Not on file  Relationships  . Social connections:    Talks on phone: Not on file    Gets together: Not on file    Attends religious service: Not on file    Active member of club or organization: Not on file    Attends meetings of clubs or organizations: Not on file    Relationship status: Not on file  . Intimate partner violence:    Fear of current or ex partner: Not on file    Emotionally abused: Not on file    Physically abused: Not on file    Forced sexual activity: Not on file  Other Topics Concern  . Not on file  Social History Narrative  . Not on file      Review of Systems  Constitutional: Negative for activity change, chills, fatigue and unexpected weight change.  HENT: Negative for congestion, postnasal drip, rhinorrhea, sneezing  and sore throat.   Respiratory: Negative for cough, chest tightness and shortness of breath.   Cardiovascular: Negative for chest pain and palpitations.  Gastrointestinal: Negative for abdominal pain, constipation, diarrhea, nausea and vomiting.       Small palpable lump in the upper abdomen. Not tender and has not changed in some time.   Genitourinary: Negative for dysuria and frequency.  Musculoskeletal: Positive for arthralgias and myalgias. Negative for back pain, joint swelling and neck pain.       Continues to have pain and weakness in left shoulder and arm. Sees orthopedic provider for this.   Skin: Negative for rash.  Neurological: Positive for weakness. Negative for tremors and numbness.       Mostly left upper extremity.  Hematological: Negative for adenopathy. Does not bruise/bleed easily.   Psychiatric/Behavioral: Negative for behavioral problems (Depression), sleep disturbance and suicidal ideas. The patient is not nervous/anxious.     Today's Vitals   08/31/18 0927  BP: (!) 151/80  Pulse: 74  Resp: 16  SpO2: 96%  Weight: 140 lb (63.5 kg)  Height: 5\' 5"  (1.651 m)    Physical Exam Vitals signs and nursing note reviewed.  Constitutional:      General: She is not in acute distress.    Appearance: Normal appearance. She is well-developed. She is not diaphoretic.  HENT:     Head: Normocephalic and atraumatic.     Mouth/Throat:     Pharynx: No oropharyngeal exudate.  Eyes:     Pupils: Pupils are equal, round, and reactive to light.  Neck:     Musculoskeletal: Normal range of motion and neck supple.     Thyroid: No thyromegaly.     Vascular: No carotid bruit or JVD.     Trachea: No tracheal deviation.  Cardiovascular:     Rate and Rhythm: Normal rate and regular rhythm.     Heart sounds: Normal heart sounds. No murmur. No friction rub. No gallop.   Pulmonary:     Effort: Pulmonary effort is normal. No respiratory distress.     Breath sounds: Normal breath sounds. No wheezing or rales.  Chest:     Chest wall: No tenderness.  Abdominal:     General: Bowel sounds are normal.     Palpations: Abdomen is soft.     Comments: Small, smooth, palpable lump in right upper uadrant of the abdomen. Non-tender. Smooth borders. No change from prior visits.   Musculoskeletal:     Comments: Chronic left shoulder pain with reduced ROM and strength.   Lymphadenopathy:     Cervical: No cervical adenopathy.  Skin:    General: Skin is warm and dry.  Neurological:     Mental Status: She is alert and oriented to person, place, and time. Mental status is at baseline.     Cranial Nerves: No cranial nerve deficit.  Psychiatric:        Behavior: Behavior normal.        Thought Content: Thought content normal.        Judgment: Judgment normal.    Assessment/Plan: 1. Mixed  hyperlipidemia Check fasting lipid panel and adjust cholesterol medication as indicated  2. Hereditary hemochromatosis (HCC) Continues to see hematology regularly.  3. Left-sided weakness Unchanged. Continue regular visits with neurology as scheduled   4. Pain and swelling of shoulder, unspecified laterality She should continue regular visits with orthopedics as scheduled   5. Screening for breast cancer - MM DIGITAL SCREENING BILATERAL; Future  General Counseling: Sophiarose verbalizes  understanding of the findings of todays visit and agrees with plan of treatment. I have discussed any further diagnostic evaluation that may be needed or ordered today. We also reviewed her medications today. she has been encouraged to call the office with any questions or concerns that should arise related to todays visit.  This patient was seen by Vincent GrosHeather Caylynn Minchew FNP Collaboration with Dr Lyndon CodeFozia M Khan as a part of collaborative care agreement  Orders Placed This Encounter  Procedures  . MM DIGITAL SCREENING BILATERAL     Time spent: 4225 Minutes      Dr Lyndon CodeFozia M Khan Internal medicine

## 2018-09-01 LAB — COMPREHENSIVE METABOLIC PANEL
A/G RATIO: 1.7 (ref 1.2–2.2)
ALK PHOS: 121 IU/L — AB (ref 39–117)
ALT: 28 IU/L (ref 0–32)
AST: 26 IU/L (ref 0–40)
Albumin: 4.3 g/dL (ref 3.6–4.8)
BILIRUBIN TOTAL: 0.5 mg/dL (ref 0.0–1.2)
BUN/Creatinine Ratio: 13 (ref 12–28)
BUN: 10 mg/dL (ref 8–27)
CHLORIDE: 101 mmol/L (ref 96–106)
CO2: 26 mmol/L (ref 20–29)
Calcium: 9.6 mg/dL (ref 8.7–10.3)
Creatinine, Ser: 0.8 mg/dL (ref 0.57–1.00)
GFR calc Af Amer: 91 mL/min/{1.73_m2} (ref >59)
GFR calc non Af Amer: 79 mL/min/{1.73_m2} (ref >59)
GLUCOSE: 85 mg/dL (ref 65–99)
Globulin, Total: 2.6 g/dL (ref 1.5–4.5)
Potassium: 4.6 mmol/L (ref 3.5–5.2)
Sodium: 141 mmol/L (ref 134–144)
Total Protein: 6.9 g/dL (ref 6.0–8.5)

## 2018-09-01 LAB — LIPID PANEL WITH LDL/HDL RATIO
Cholesterol, Total: 335 mg/dL — ABNORMAL HIGH (ref 100–199)
HDL: 38 mg/dL — AB (ref >39)
Triglycerides: 1024 mg/dL (ref 0–149)

## 2018-09-02 DIAGNOSIS — E782 Mixed hyperlipidemia: Secondary | ICD-10-CM | POA: Insufficient documentation

## 2018-09-02 DIAGNOSIS — M25419 Effusion, unspecified shoulder: Secondary | ICD-10-CM | POA: Insufficient documentation

## 2018-09-02 DIAGNOSIS — Z1239 Encounter for other screening for malignant neoplasm of breast: Secondary | ICD-10-CM | POA: Insufficient documentation

## 2018-09-02 DIAGNOSIS — M25519 Pain in unspecified shoulder: Secondary | ICD-10-CM

## 2018-09-04 ENCOUNTER — Other Ambulatory Visit: Payer: Self-pay | Admitting: Nurse Practitioner

## 2018-09-04 DIAGNOSIS — Z1231 Encounter for screening mammogram for malignant neoplasm of breast: Secondary | ICD-10-CM

## 2018-09-13 ENCOUNTER — Other Ambulatory Visit: Payer: Self-pay | Admitting: Adult Health

## 2018-09-13 MED ORDER — FENOFIBRATE 145 MG PO TABS: 145.0000 mg | ORAL_TABLET | Freq: Every day | ORAL | 71 refills | Status: DC

## 2018-09-15 ENCOUNTER — Ambulatory Visit: Payer: Medicaid Other | Admitting: Adult Health

## 2018-09-15 ENCOUNTER — Encounter: Payer: Self-pay | Admitting: Adult Health

## 2018-09-15 VITALS — BP 113/63 | HR 61 | Temp 98.4°F | Resp 16 | Ht 65.0 in | Wt 140.0 lb

## 2018-09-15 DIAGNOSIS — E781 Pure hyperglyceridemia: Secondary | ICD-10-CM

## 2018-09-15 DIAGNOSIS — T50905A Adverse effect of unspecified drugs, medicaments and biological substances, initial encounter: Secondary | ICD-10-CM

## 2018-09-15 DIAGNOSIS — R531 Weakness: Secondary | ICD-10-CM

## 2018-09-15 MED ORDER — GEMFIBROZIL 600 MG PO TABS: 600.0000 mg | ORAL_TABLET | Freq: Two times a day (BID) | ORAL | 2 refills | Status: DC

## 2018-09-15 NOTE — Patient Instructions (Signed)
High Triglycerides Eating Plan  Triglycerides are a type of fat in the blood. High levels of triglycerides can increase your risk of heart disease and stroke. If your triglyceride levels are high, choosing the right foods can help lower your triglycerides and keep your heart healthy. Work with your health care provider or a diet and nutrition specialist (dietitian) to develop an eating plan that is right for you.  What are tips for following this plan?  General guidelines    · Lose weight, if you are overweight. For most people, losing 5-10 lbs (2-5 kg) helps lower triglyceride levels. A weight-loss plan may include.  ? 30 minutes of exercise at least 5 days a week.  ? Reducing the amount of calories, sugar, and fat you eat.  · Eat a wide variety of fresh fruits, vegetables, and whole grains. These foods are high in fiber.  · Eat foods that contain healthy fats, such as fatty fish, nuts, seeds, and olive oil.  · Avoid foods that are high in added sugar, added salt (sodium), saturated fat, and trans fat.  · Avoid low-fiber, refined carbohydrates such as white bread, crackers, noodles, and white rice.  · Avoid foods with partially hydrogenated oils (trans fats), such as fried foods or stick margarine.  · Limit alcohol intake to no more than 1 drink a day for nonpregnant women and 2 drinks a day for men. One drink equals 12 oz of beer, 5 oz of wine, or 1½ oz of hard liquor. Your health care provider may recommend that you drink less depending on your overall health.  Reading food labels  · Check food labels for the amount of saturated fat. Choose foods with no or very little saturated fat.  · Check food labels for the amount of trans fat. Choose foods with no trans fat.  · Check food labels for the amount of cholesterol. Choose foods low in cholesterol. Ask your dietitian how much cholesterol you should have each day.  · Check food labels for the amount of sodium. Choose foods with less than 140 milligrams (mg) per  serving.  Shopping  · Buy dairy products labeled as nonfat (skim) or low-fat (1%).  · Avoid buying processed or prepackaged foods. These are often high in added sugar, sodium, and fat.  Cooking  · Choose healthy fats when cooking, such as olive oil or canola oil.  · Cook foods using lower fat methods, such as baking, broiling, boiling, or grilling.  · Make your own sauces, dressings, and marinades when possible, instead of buying them. Store-bought sauces, dressings, and marinades are often high in sodium and sugar.  Meal planning  · Eat more home-cooked food and less restaurant, buffet, and fast food.  · Eat fatty fish at least 2 times each week. Examples of fatty fish include salmon, trout, mackerel, tuna, and herring.  · If you eat whole eggs, do not eat more than 3 egg yolks per week.  What foods are recommended?  The items listed may not be a complete list. Talk with your dietitian about what dietary choices are best for you.  Grains  Whole wheat or whole grain breads, crackers, cereals, and pasta. Unsweetened oatmeal. Bulgur. Barley. Quinoa. Brown rice. Whole wheat flour tortillas.  Vegetables  Fresh or frozen vegetables. Low-sodium canned vegetables.  Fruits  All fresh, canned (in natural juice), or frozen fruits.  Meats and other protein foods  Skinless chicken or turkey. Ground chicken or turkey. Lean cuts of pork, trimmed of   fat. Fish and seafood, especially salmon, trout, and herring. Egg whites. Dried beans, peas, or lentils. Unsalted nuts or seeds. Unsalted canned beans. Natural peanut or almond butter.  Dairy  Low-fat dairy products. Skim or low-fat (1%) milk. Reduced fat (2%) and low-sodium cheese. Low-fat ricotta cheese. Low-fat cottage cheese. Plain, low-fat yogurt.  Fats and oils  Tub margarine without trans fats. Light or reduced-fat mayonnaise. Light or reduced-fat salad dressings. Avocado. Safflower, olive, sunflower, soybean, and canola oils.  What foods are not recommended?  The items listed  may not be a complete list. Talk with your dietitian about what dietary choices are best for you.  Grains  White bread. White (regular) pasta. White rice. Cornbread. Bagels. Pastries. Crackers that contain trans fat.  Vegetables  Creamed or fried vegetables. Vegetables in a cheese sauce.  Fruits  Sweetened dried fruit. Canned fruit in syrup. Fruit juice.  Meats and other protein foods  Fatty cuts of meat. Ribs. Chicken wings. Bacon. Sausage. Bologna. Salami. Chitterlings. Fatback. Hot dogs. Bratwurst. Packaged lunch meats.  Dairy  Whole or reduced-fat (2%) milk. Half-and-half. Cream cheese. Full-fat or sweetened yogurt. Full-fat cheese. Nondairy creamers. Whipped toppings. Processed cheese or cheese spreads. Cheese curds.  Beverages  Alcohol. Sweetened drinks, such as soda, lemonade, fruit drinks, or punches.  Fats and oils  Butter. Stick margarine. Lard. Shortening. Ghee. Bacon fat. Tropical oils, such as coconut, palm kernel, or palm oils.  Sweets and desserts  Corn syrup. Sugars. Honey. Molasses. Candy. Jam and jelly. Syrup. Sweetened cereals. Cookies. Pies. Cakes. Donuts. Muffins. Ice cream.  Condiments  Store-bought sauces, dressings, and marinades that are high in sugar, such as ketchup and barbecue sauce.  Summary  · High levels of triglycerides can increase the risk of heart disease and stroke. Choosing the right foods can help lower your triglycerides.  · Eat plenty of fresh fruits, vegetables, and whole grains. Choose low-fat dairy and lean meats. Eat fatty fish at least twice a week.  · Avoid processed and prepackaged foods with added sugar, sodium, saturated fat, and trans fat.  · If you need suggestions or have questions about what types of food are good for you, talk with your health care provider or a dietitian.  This information is not intended to replace advice given to you by your health care provider. Make sure you discuss any questions you have with your health care provider.  Document Released:  05/27/2004 Document Revised: 10/12/2016 Document Reviewed: 10/12/2016  Elsevier Interactive Patient Education © 2019 [employer]

## 2018-09-15 NOTE — Progress Notes (Signed)
[facility] [address] [place], [place] [address]  Internal MEDICINE  Office Visit Note  Patient Name: Tammy Lam  [id number]  [id number]  Date of Service: 09/20/2018  Chief Complaint  Patient presents with  . Fever    started last night, body has a burning sensation, took tylenol at 2am. chills, no vomitig or diarrhea     HPI Pt is here for a sick visit. Pt reports after starting tricor (2 doses) she noticed this episode of left sided weakness, hot sensation.  No other sympotoms.  Patient has stopped taking medication per husband.  They would like an alternative to the TriCor.  She denies any other symptoms of allergic reaction.  No rash, itching, vomiting or diarrhea.     Current Medication:  Outpatient Encounter Medications as of 09/15/2018  Medication Sig  . diclofenac sodium (VOLTAREN) 1 % GEL Apply topically 4 (four) times daily.  Marland Kitchen gabapentin (NEURONTIN) 100 MG capsule Take 1 capsule by mouth 4 (four) times daily.  Marland Kitchen ibuprofen (ADVIL,MOTRIN) 600 MG tablet Take by mouth.  . nortriptyline (PAMELOR) 10 MG capsule Take 10 mg by mouth at bedtime.  . [DISCONTINUED] fenofibrate (TRICOR) 145 MG tablet Take 1 tablet (145 mg total) by mouth daily.  Marland Kitchen gemfibrozil (LOPID) 600 MG tablet Take 1 tablet (600 mg total) by mouth 2 (two) times daily before a meal.   No facility-administered encounter medications on file as of 09/15/2018.       Medical History: Past Medical History:  Diagnosis Date  . Elevated ferritin   . Headache   . Hemochromatosis   . Hyperlipidemia   . Hyponatremia   . Medical history non-contributory   . Shoulder injury      Vital Signs: BP 113/63 (BP Location: Left Arm, Patient Position: Sitting, Cuff Size: Normal)   Pulse 61   Temp 98.4 F (36.9 C)   Resp 16   Ht 5\' 5"  (1.651 m)   Wt 140 lb (63.5 kg)   LMP  (LMP Unknown)   SpO2 93%   BMI 23.30 kg/m    Review of Systems  Constitutional: Negative for chills, fatigue and  unexpected weight change.  HENT: Negative for congestion, rhinorrhea, sneezing and sore throat.   Eyes: Negative for photophobia, pain and redness.  Respiratory: Negative for cough, chest tightness and shortness of breath.   Cardiovascular: Negative for chest pain and palpitations.  Gastrointestinal: Negative for abdominal pain, constipation, diarrhea, nausea and vomiting.  Endocrine: Negative.   Genitourinary: Negative for dysuria and frequency.  Musculoskeletal: Negative for arthralgias, back pain, joint swelling and neck pain.  Skin: Negative for rash.  Allergic/Immunologic: Negative.   Neurological: Negative for tremors and numbness.  Hematological: Negative for adenopathy. Does not bruise/bleed easily.  Psychiatric/Behavioral: Negative for behavioral problems and sleep disturbance. The patient is not nervous/anxious.     Physical Exam Vitals signs and nursing note reviewed.  Constitutional:      General: She is not in acute distress.    Appearance: She is well-developed. She is not diaphoretic.  HENT:     Head: Normocephalic and atraumatic.     Mouth/Throat:     Pharynx: No oropharyngeal exudate.  Eyes:     Pupils: Pupils are equal, round, and reactive to light.  Neck:     Musculoskeletal: Normal range of motion and neck supple.     Thyroid: No thyromegaly.     Vascular: No JVD.     Trachea: No tracheal deviation.  Cardiovascular:  Rate and Rhythm: Normal rate and regular rhythm.     Heart sounds: Normal heart sounds. No murmur. No friction rub. No gallop.   Pulmonary:     Effort: Pulmonary effort is normal. No respiratory distress.     Breath sounds: Normal breath sounds. No wheezing or rales.  Chest:     Chest wall: No tenderness.  Abdominal:     Palpations: Abdomen is soft.     Tenderness: There is no abdominal tenderness. There is no guarding.  Musculoskeletal: Normal range of motion.  Lymphadenopathy:     Cervical: No cervical adenopathy.  Skin:    General:  Skin is warm and dry.  Neurological:     Mental Status: She is alert and oriented to person, place, and time.     Cranial Nerves: No cranial nerve deficit.  Psychiatric:        Behavior: Behavior normal.        Thought Content: Thought content normal.        Judgment: Judgment normal.    Assessment/Plan: 1. Adverse effect of drug, initial encounter Patient had likely allergic reaction to TriCor.  Instructed patient to stop taking medication which she is already done.  2. High triglycerides New medication gemfibrozil started for patients hypertriglyceridemia at this time. - gemfibrozil (LOPID) 600 MG tablet; Take 1 tablet (600 mg total) by mouth 2 (two) times daily before a meal.  Dispense: 60 tablet; Refill: 2  3. Left-sided weakness Patient has chronic left-sided weakness.  She denies any change in this at this time.  General Counseling: Tammy Lam verbalizes understanding of the findings of todays visit and agrees with plan of treatment. I have discussed any further diagnostic evaluation that may be needed or ordered today. We also reviewed her medications today. she has been encouraged to call the office with any questions or concerns that should arise related to todays visit.   No orders of the defined types were placed in this encounter.   Meds ordered this encounter  Medications  . gemfibrozil (LOPID) 600 MG tablet    Sig: Take 1 tablet (600 mg total) by mouth 2 (two) times daily before a meal.    Dispense:  60 tablet    Refill:  2    Time spent: 25 Minutes  This patient was seen by Blima LedgerAdam Jamine Wingate AGNP-C in Collaboration with Dr Lyndon CodeFozia M Khan as a part of collaborative care agreement.  Johnna AcostaAdam J. Richar Dunklee AGNP-C Internal Medicine

## 2018-09-20 ENCOUNTER — Ambulatory Visit
Admission: RE | Admit: 2018-09-20 | Discharge: 2018-09-20 | Disposition: A | Discharge status: Home / Self Care | Payer: Medicaid Other | Source: Ambulatory Visit | Attending: Nurse Practitioner | Admitting: Nurse Practitioner

## 2018-09-20 DIAGNOSIS — Z1231 Encounter for screening mammogram for malignant neoplasm of breast: Secondary | ICD-10-CM | POA: Insufficient documentation

## 2018-12-11 ENCOUNTER — Other Ambulatory Visit: Payer: Self-pay | Admitting: Nurse Practitioner

## 2019-01-09 ENCOUNTER — Other Ambulatory Visit: Payer: Self-pay

## 2019-01-09 ENCOUNTER — Other Ambulatory Visit: Payer: Self-pay | Admitting: Hematology and Oncology

## 2019-01-09 ENCOUNTER — Inpatient Hospital Stay: Payer: Medicaid Other | Attending: Hematology and Oncology

## 2019-01-09 DIAGNOSIS — E119 Type 2 diabetes mellitus without complications: Secondary | ICD-10-CM | POA: Diagnosis not present

## 2019-01-09 DIAGNOSIS — Z803 Family history of malignant neoplasm of breast: Secondary | ICD-10-CM | POA: Insufficient documentation

## 2019-01-09 DIAGNOSIS — E781 Pure hyperglyceridemia: Secondary | ICD-10-CM | POA: Insufficient documentation

## 2019-01-09 DIAGNOSIS — Z79899 Other long term (current) drug therapy: Secondary | ICD-10-CM | POA: Insufficient documentation

## 2019-01-09 DIAGNOSIS — E785 Hyperlipidemia, unspecified: Secondary | ICD-10-CM | POA: Insufficient documentation

## 2019-01-09 LAB — COMPREHENSIVE METABOLIC PANEL
ALT: 28 U/L (ref 0–44)
AST: 28 U/L (ref 15–41)
Albumin: 4.1 g/dL (ref 3.5–5.0)
Alkaline Phosphatase: 110 U/L (ref 38–126)
Anion gap: 10 (ref 5–15)
BUN: 15 mg/dL (ref 8–23)
CO2: 23 mmol/L (ref 22–32)
Calcium: 8.8 mg/dL — ABNORMAL LOW (ref 8.9–10.3)
Chloride: 105 mmol/L (ref 98–111)
Creatinine, Ser: 0.79 mg/dL (ref 0.44–1.00)
GFR calc Af Amer: >60 mL/min (ref >60)
GFR calc non Af Amer: >60 mL/min (ref >60)
Glucose, Bld: 96 mg/dL (ref 70–99)
Potassium: 3.7 mmol/L (ref 3.5–5.1)
Sodium: 138 mmol/L (ref 135–145)
Total Bilirubin: 0.9 mg/dL (ref 0.3–1.2)
Total Protein: 6.9 g/dL (ref 6.5–8.1)

## 2019-01-09 LAB — CBC WITH DIFFERENTIAL/PLATELET
Abs Immature Granulocytes: 0.02 10*3/uL (ref 0.00–0.07)
Basophils Absolute: 0 10*3/uL (ref 0.0–0.1)
Basophils Relative: 1 %
Eosinophils Absolute: 0.2 10*3/uL (ref 0.0–0.5)
Eosinophils Relative: 4 %
HCT: 43.6 % (ref 36.0–46.0)
Hemoglobin: 15 g/dL (ref 12.0–15.0)
Immature Granulocytes: 0 %
Lymphocytes Relative: 47 %
Lymphs Abs: 2.4 10*3/uL (ref 0.7–4.0)
MCH: 32.1 pg (ref 26.0–34.0)
MCHC: 34.4 g/dL (ref 30.0–36.0)
MCV: 93.2 fL (ref 80.0–100.0)
Monocytes Absolute: 0.3 10*3/uL (ref 0.1–1.0)
Monocytes Relative: 6 %
Neutro Abs: 2.1 10*3/uL (ref 1.7–7.7)
Neutrophils Relative %: 42 %
Platelets: 145 10*3/uL — ABNORMAL LOW (ref 150–400)
RBC: 4.68 MIL/uL (ref 3.87–5.11)
RDW: 12 % (ref 11.5–15.5)
WBC: 5 10*3/uL (ref 4.0–10.5)
nRBC: 0 % (ref 0.0–0.2)

## 2019-01-09 LAB — IRON AND TIBC
Iron: 83 ug/dL (ref 28–170)
Saturation Ratios: 23 % (ref 10.4–31.8)
TIBC: 359 ug/dL (ref 250–450)
UIBC: 276 ug/dL

## 2019-01-09 LAB — FERRITIN: Ferritin: 454 ng/mL — ABNORMAL HIGH (ref 11–307)

## 2019-01-10 LAB — AFP TUMOR MARKER: AFP, Serum, Tumor Marker: 5.2 ng/mL (ref 0.0–8.3)

## 2019-01-15 NOTE — Progress Notes (Signed)
[facility]  [address] [place], [place] [address] Phone: [phone]  Fax: [phone]   Clinic Day:  01/16/2019  Referring physician: Lavera Guise, MD  Chief Complaint: Tammy Lam is a 63 y.o. female with hemochromatosis (H63D heterozygote) who is seen for new patient assessment.  HPI:  The patient was diagnosed with hemochromatosis in April 2017 following an elevated ferritin level of 485. She was referred by Dr. Humphrey Rolls. She was asymptomatic.   She was initially seen in the hematology clinic on 12/02/2015 by Dr. Charlaine Dalton.  Work-up revealed a ferritin of 332 and CRP of 3.0.  Hemochromatosis testing was positive for the H63D mutation and negative for the C282Y and S65C mutations.  She was felt to be a carrier.  Plan was for phelbotomy if ferritin was greater than 1000, or iron saturation greater than 60%.  She was scheduled to see by Dr. Rogue Bussing annually.  Ferritin has been followed: 485 on 11/18/2015, 332 on 12/02/2015, 387 on 01/14/2017, 340 on 01/10/2018, 598 on 05/15/2018, and 454 on 01/09/2019.   Iron saturation has been followed: 38% on 11/18/2015, 29% on 01/14/2017, 45% on 01/10/2018, 37% on 05/15/2018, 23% on 01/09/2019.   The patient was last seen in the hematology clinic on 05/29/2018 by Dr. Rogue Bussing.  At that time, she was fatigued. She had increased chronic joint and back pain in addition to pain on the left side of her body. She was previously evaluated in neurology by Dr. Melrose Nakayama.   Abdominal ultrasound at [facility] on 05/26/2018 revealed probable hepatic fatty infiltration and focal fatty sparing.  She is seen in Physical Therapy by Joneen Boers, most recently on 06/08/2018.   She saw Leretha Pol, NP on 08/31/2018 and continued on Lopid for hypertriglyceridemia. Medication was changed to gemfibrozil on 09/15/2018.   CBC on 01/09/2019: WBC 5,000 (ANC 2,100), hemoglobin 15.0, hematocrit 43.6, platelets 145,000.  Calcium 8.8. Iron saturation 23%, ferritin 454. AFP was 5.2.   During the interim, she is doing well. She has hot flashes and occasional tremor in her hand and leg.   She reports chronic left shoulder pain from a work injury several years ago. She has hyperlipidemia. She entered menopause at about age 68.   Regarding her diet, she eats rice and lots of fast food. The only meat she eats is chicken and fish. She does not drink or smoke.  She denies any family history of blood disorders. She denies any history of hepatitis or jaundice.    Past Medical History:  Diagnosis Date  . Elevated ferritin   . Headache   . Hemochromatosis   . Hyperlipidemia   . Hyponatremia   . Medical history non-contributory   . Shoulder injury     Past Surgical History:  Procedure Laterality Date  . ANKLE SURGERY Right     Family History  Problem Relation Age of Onset  . Liver disease Other   . Breast cancer Neg Hx     Social History:  reports that she has never smoked. She has never used smokeless tobacco. She reports that she does not drink alcohol or use drugs. She does not drink or smoke. She is originally from [place] and came to the [place] 25 years ago as a refugee.  Her name is pronounced "zip boy". She moved to [place] from [place] a few years ago. Her husband is also from [place].  She is retired but previously worked in a [employer].  The patient is accompanied by  interpreter Namon Cirri today.   Allergies:  Allergies  Allergen Reactions  . Diphenhydramine Other (See Comments)  . Benadryl [Diphenhydramine Hcl] Itching  . Influenza Vaccines   . Penicillins Itching    Current Medications: Current Outpatient Medications  Medication Sig Dispense Refill  . Baclofen 5 MG TABS Take 1 tablet by mouth 2 (two) times a day.     Marland Kitchen gemfibrozil (LOPID) 600 MG tablet Take 1 tablet (600 mg total) by mouth 2 (two) times daily before a meal. (Patient not taking: Reported on 01/16/2019) 60 tablet  2   No current facility-administered medications for this visit.     Review of Systems  Constitutional: Positive for diaphoresis (hot flashes) and malaise/fatigue. Negative for chills, fever and weight loss.  HENT: Negative.  Negative for congestion, hearing loss, nosebleeds, sinus pain and sore throat.   Eyes: Negative.  Negative for blurred vision.  Respiratory: Negative.  Negative for cough, shortness of breath and wheezing.   Cardiovascular: Negative.  Negative for chest pain, palpitations, claudication, leg swelling and PND.  Gastrointestinal: Negative.  Negative for abdominal pain, constipation, diarrhea, heartburn, nausea and vomiting.  Genitourinary: Negative.  Negative for dysuria, frequency and urgency.  Musculoskeletal: Positive for joint pain (shoulder). Negative for back pain and myalgias.  Skin: Negative.  Negative for rash.  Neurological: Positive for tremors (occasional in hand). Negative for dizziness, tingling, sensory change, weakness and headaches.  Endo/Heme/Allergies: Negative.  Does not bruise/bleed easily.  Psychiatric/Behavioral: Negative.  Negative for depression and memory loss. The patient is not nervous/anxious and does not have insomnia.   All other systems reviewed and are negative.  Performance status (ECOG): 1  Physical Exam  Constitutional: She is oriented to person, place, and time. She appears well-developed and well-nourished. No distress.  HENT:  Head: Normocephalic and atraumatic.  Mouth/Throat: Oropharynx is clear and moist. No oropharyngeal exudate.  Dark hair.  Wearing a mask.  Eyes: Pupils are equal, round, and reactive to light. Conjunctivae and EOM are normal. No scleral icterus.  Glasses.  Brown eyes.  Neck: Normal range of motion. Neck supple. No JVD present.  Cardiovascular: Normal rate and regular rhythm. Exam reveals no gallop and no friction rub.  No murmur heard. Pulmonary/Chest: Effort normal and breath sounds normal. No  respiratory distress. She has no wheezes. She has no rales.  Abdominal: Soft. Bowel sounds are normal. She exhibits no distension and no mass. There is no abdominal tenderness. There is no rebound and no guarding.  Musculoskeletal: Normal range of motion.        General: No tenderness or edema.  Lymphadenopathy:    She has no cervical adenopathy.    She has no axillary adenopathy.       Right: No supraclavicular adenopathy present.       Left: No supraclavicular adenopathy present.  Neurological: She is alert and oriented to person, place, and time.  Skin: Skin is warm and dry. No rash noted. She is not diaphoretic. No erythema. No pallor.  Psychiatric: She has a normal mood and affect. Her behavior is normal. Judgment and thought content normal.  Nursing note and vitals reviewed.   No visits with results within 3 Day(s) from this visit.  Latest known visit with results is:  Appointment on 01/09/2019  Component Date Value Ref Range Status  . Ferritin 01/09/2019 454* 11 - 307 ng/mL Final   Performed at [facility], [address] [place], [place] [address]  . Iron 01/09/2019 83  28 - 170  ug/dL Final  . TIBC 01/09/2019 359  250 - 450 ug/dL Final  . Saturation Ratios 01/09/2019 23  10.4 - 31.8 % Final  . UIBC 01/09/2019 276  ug/dL Final   Performed at [facility], [address], [place], [place] [address]  . Sodium 01/09/2019 138  135 - 145 mmol/L Final  . Potassium 01/09/2019 3.7  3.5 - 5.1 mmol/L Final  . Chloride 01/09/2019 105  98 - 111 mmol/L Final  . CO2 01/09/2019 23  22 - 32 mmol/L Final  . Glucose, Bld 01/09/2019 96  70 - 99 mg/dL Final  . BUN 01/09/2019 15  8 - 23 mg/dL Final  . Creatinine, Ser 01/09/2019 0.79  0.44 - 1.00 mg/dL Final  . Calcium 01/09/2019 8.8* 8.9 - 10.3 mg/dL Final  . Total Protein 01/09/2019 6.9  6.5 - 8.1 g/dL Final  . Albumin 01/09/2019 4.1  3.5 - 5.0 g/dL Final  . AST 01/09/2019 28  15 - 41 U/L Final  . ALT 01/09/2019 28  0 -  44 U/L Final  . Alkaline Phosphatase 01/09/2019 110  38 - 126 U/L Final  . Total Bilirubin 01/09/2019 0.9  0.3 - 1.2 mg/dL Final  . GFR calc non Af Amer 01/09/2019 >60  >60 mL/min Final  . GFR calc Af Amer 01/09/2019 >60  >60 mL/min Final  . Anion gap 01/09/2019 10  5 - 15 Final   Performed at [facility], [address], [place], [place] [address]  . WBC 01/09/2019 5.0  4.0 - 10.5 K/uL Final  . RBC 01/09/2019 4.68  3.87 - 5.11 MIL/uL Final  . Hemoglobin 01/09/2019 15.0  12.0 - 15.0 g/dL Final  . HCT 01/09/2019 43.6  36.0 - 46.0 % Final  . MCV 01/09/2019 93.2  80.0 - 100.0 fL Final  . MCH 01/09/2019 32.1  26.0 - 34.0 pg Final  . MCHC 01/09/2019 34.4  30.0 - 36.0 g/dL Final  . RDW 01/09/2019 12.0  11.5 - 15.5 % Final  . Platelets 01/09/2019 145* 150 - 400 K/uL Final  . nRBC 01/09/2019 0.0  0.0 - 0.2 % Final  . Neutrophils Relative % 01/09/2019 42  % Final  . Neutro Abs 01/09/2019 2.1  1.7 - 7.7 K/uL Final  . Lymphocytes Relative 01/09/2019 47  % Final  . Lymphs Abs 01/09/2019 2.4  0.7 - 4.0 K/uL Final  . Monocytes Relative 01/09/2019 6  % Final  . Monocytes Absolute 01/09/2019 0.3  0.1 - 1.0 K/uL Final  . Eosinophils Relative 01/09/2019 4  % Final  . Eosinophils Absolute 01/09/2019 0.2  0.0 - 0.5 K/uL Final  . Basophils Relative 01/09/2019 1  % Final  . Basophils Absolute 01/09/2019 0.0  0.0 - 0.1 K/uL Final  . Immature Granulocytes 01/09/2019 0  % Final  . Abs Immature Granulocytes 01/09/2019 0.02  0.00 - 0.07 K/uL Final   Performed at [facility], [address], [place], [place] [address]  . AFP, Serum, Tumor Marker 01/09/2019 5.2  0.0 - 8.3 ng/mL Final   Comment: (NOTE) Roche Diagnostics Electrochemiluminescence Immunoassay (ECLIA) Values obtained with different assay methods or kits cannot be used interchangeably.  Results cannot be interpreted as absolute evidence of the presence or absence of malignant disease. This test is not interpretable in  pregnant females. Performed At: [facility] [place], [place] [id number] Rush Farmer MD TW:[id number]     Assessment:  Tammy Lam is a 64 y.o. female with hereditary hemochromatosis (  H63D heterozygote) diagnosed in 11/2015.    Ferritin has been followed: 485 on 11/18/2015, 332 on 12/02/2015, 387 on 01/14/2017, 340 on 01/10/2018, 598 on 05/15/2018, and 454 on 01/09/2019.   Iron saturation has been followed: 38% on 11/18/2015, 29% on 01/14/2017, 45% on 01/10/2018, 37% on 05/15/2018, 23% on 01/09/2019.   Abdominal ultrasound at [facility] on 05/26/2018 revealed probable hepatic fatty infiltration and focal fatty sparing.  AFP has ben followed:  5.2 on 01/09/2019.  Symptomatically, she notes chronic left shoulder pain felt work related.  She denies any arthritis, diabetes, or change in skin pigmentation.  Plan: 1.   Labs today: sed rate, CRP, hepatitis B core antibody total, hepatitis B surface antigen, hepatitis C antibody. 2.   Hereditary hemochromatosis  Review entire medical history, diagnosis and management of hereditary hemochromatosis.  Testing on 04/11/217 revealed one copy of H63D mutation   Patients with one copy of H63D are typically carriers and do not have manifestations of iron overload unless they carry a rare mutation (not tested).   Typically iron saturation >= 40% for evidence of iron overload.    Patient noted to have 1 iron saturation elevated (45%) on 01/10/2018.   Ferritin is an acute phase reactant and can be elevated in inflammation.    CRP was elevated on 12/02/2015.  Discuss signs and symptoms of iron overload secondary to iron deposition in organs   Fatigue, joint pain, liver injury (including hepatocellular carcinoma), diabetes, cardiac issues, increased skin pigmentation.   Patient denies all of these symptoms.   Discuss plan for follow-up abdominal ultrasound    Discuss ultrasound and AFP typically performed every 6 months in  patients with hereditary hemochromatosis and iron overload.  Discuss correlating markers of inflammation (ESR and CRP) with ferritin.  Discuss checking hepatitis serologies (B and C) as hepatitis increases risk for liver damage.  Discuss follow-up with primary care physician if markers of inflammation are elevated. 3.   Schedule RUQ ultrasound. 4.   RTC in 6 months for MD assessment, labs (CBC with diff, CMP, ferritin, AFP), and review of ultrasound.  I discussed the assessment and treatment plan with the patient.  The patient was provided an opportunity to ask questions and all were answered.  The patient agreed with the plan and demonstrated an understanding of the instructions.  The patient was advised to call back if the symptoms worsen or if the condition fails to improve as anticipated.  I provided 35 minutes of face-to-face time during this this encounter and > 50% was spent counseling as documented under my assessment and plan.    Lequita Asal, MD, PhD    01/16/2019, 11:14 AM  I, Molly Dorshimer, am acting as Education administrator for [employer]. Mike Gip, MD, PhD.  I, Melissa C. Mike Gip, MD, have reviewed the above documentation for accuracy and completeness, and I agree with the above.

## 2019-01-16 ENCOUNTER — Encounter: Payer: Self-pay | Admitting: Hematology and Oncology

## 2019-01-16 ENCOUNTER — Inpatient Hospital Stay: Payer: Medicaid Other

## 2019-01-16 ENCOUNTER — Ambulatory Visit: Payer: Medicaid Other | Admitting: Internal Medicine

## 2019-01-16 ENCOUNTER — Other Ambulatory Visit: Payer: Self-pay

## 2019-01-16 ENCOUNTER — Inpatient Hospital Stay (HOSPITAL_BASED_OUTPATIENT_CLINIC_OR_DEPARTMENT_OTHER): Payer: Medicaid Other | Admitting: Hematology and Oncology

## 2019-01-16 DIAGNOSIS — E781 Pure hyperglyceridemia: Secondary | ICD-10-CM

## 2019-01-16 DIAGNOSIS — Z803 Family history of malignant neoplasm of breast: Secondary | ICD-10-CM

## 2019-01-16 DIAGNOSIS — E785 Hyperlipidemia, unspecified: Secondary | ICD-10-CM | POA: Diagnosis not present

## 2019-01-16 DIAGNOSIS — E119 Type 2 diabetes mellitus without complications: Secondary | ICD-10-CM

## 2019-01-16 DIAGNOSIS — Z79899 Other long term (current) drug therapy: Secondary | ICD-10-CM

## 2019-01-16 LAB — C-REACTIVE PROTEIN: CRP: 1.9 mg/dL — ABNORMAL HIGH (ref <1.0)

## 2019-01-16 LAB — SEDIMENTATION RATE: Sed Rate: 8 mm/hr (ref 0–30)

## 2019-01-16 NOTE — Progress Notes (Signed)
Pt here for follow up. Previous Dr. Brahmanday patient. Denies any concerns.  

## 2019-01-17 LAB — HEPATITIS B CORE ANTIBODY, TOTAL: Hep B Core Total Ab: NEGATIVE

## 2019-01-17 LAB — HEPATITIS C ANTIBODY: HCV Ab: 0.1 s/co ratio (ref 0.0–0.9)

## 2019-01-17 LAB — HEPATITIS B SURFACE ANTIGEN: Hepatitis B Surface Ag: NEGATIVE

## 2019-01-30 ENCOUNTER — Other Ambulatory Visit: Payer: Self-pay

## 2019-01-30 ENCOUNTER — Ambulatory Visit
Admission: RE | Admit: 2019-01-30 | Discharge: 2019-01-30 | Disposition: A | Discharge status: Home / Self Care | Payer: Medicaid Other | Source: Ambulatory Visit | Attending: Hematology and Oncology | Admitting: Hematology and Oncology

## 2019-01-30 ENCOUNTER — Telehealth: Payer: Self-pay | Admitting: *Deleted

## 2019-01-30 NOTE — Telephone Encounter (Signed)
Called report:   IMPRESSION: Minimal sludge within gallbladder.  Question fatty infiltration of liver as above with potential 3.1 cm diameter mass lesion versus area of focal sparing; further evaluation of the liver by MR imaging with and without contrast recommended to exclude hepatic mass/malignancy.  These results will be called to the ordering clinician or representative by the Radiologist Assistant, and communication documented in the PACS or zVision Dashboard.   Electronically Signed   By: Lavonia Dana M.D.   On: 01/30/2019 15:06

## 2019-02-04 DIAGNOSIS — K769 Liver disease, unspecified: Secondary | ICD-10-CM | POA: Insufficient documentation

## 2019-02-04 NOTE — Progress Notes (Signed)
[facility] [facility]  [address], [address] [place], [place] [address] Phone: [phone]  Fax: [phone]   Clinic Day:  02/05/2019  Referring physician: Lyndon CodeKhan, Tammy M, MD  Chief Complaint: Tammy LittenDiep Lam is a 63 y.o. female with hemochromatosis (H63D heterozygote) who is seen for review of work-up and discussion regarding direction of therapy.  HPI: The patient was last seen in the hematology clinic on 01/16/2019. At that time, she noted chronic left shoulder pain, felt work related. She denied any arthritis, diabetes, or change in skin pigmentation. AFP was 5.2.  Hemoglobin was 16.1, hematocrit 43.6, platelets 145,000.  Ferritin was 454.  Hepatitis C Ab negative, Hepatitis B surface Ag negative, Hep B core total Ab negative. Sed rate was 8 and CRP 1.9 (< 1.0).  RUQ ultrasound on 01/30/2019 revealed minimal sludge within gallbladder. There was questionable fatty infiltration of liver with potential 3.1 cm diameter mass lesion versus area of focal sparing.  MRI was recommended to exclude hepatic mass/maligancy.     During the interim, she continues to feel "weak and tired".  She denies any RUQ pain.  She has some discomfort (3 out of 10) in her left hand and arm.  She denies any chest pain or shortness of breath.  She believes that it is do to repetitive work with her arm.  She saw the neurologist.  She plans to have her gabapentin refilled.   Past Medical History:  Diagnosis Date   Elevated ferritin    Headache    Hemochromatosis    Hyperlipidemia    Hyponatremia    Medical history non-contributory    Shoulder injury     Past Surgical History:  Procedure Laterality Date   ANKLE SURGERY Right     Family History  Problem Relation Age of Onset   Liver disease Other    Breast cancer Neg Hx     Social History:  reports that she has never smoked. She has never used smokeless tobacco. She reports that she does not drink alcohol or use drugs. She is  originally from [place] and came to the [place] 25 years ago as a refugee.  Her name is pronounced "zip boy". She moved to [place] from [place] a few years ago. Her husband is also from [place].  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  The patient is accompanied by interpreter Tammy MusselKim Lam today. The patient is alone today.  Allergies:  Allergies  Allergen Reactions   Diphenhydramine Other (See Comments)   Benadryl [Diphenhydramine Hcl] Itching   Influenza Vaccines    Penicillins Itching    Current Medications: Current Outpatient Medications  Medication Sig Dispense Refill   Baclofen 5 MG TABS Take 1 tablet by mouth 2 (two) times a day.      gemfibrozil (LOPID) 600 MG tablet Take 1 tablet (600 mg total) by mouth 2 (two) times daily before a meal. (Patient not taking: Reported on 01/16/2019) 60 tablet 2   No current facility-administered medications for this visit.     Review of Systems  Constitutional: Positive for diaphoresis (hot flashes) and malaise/fatigue. Negative for chills, fever and weight loss (up 1 pound).  HENT: Negative.  Negative for congestion, ear pain, hearing loss, nosebleeds, sinus pain, sore throat and tinnitus.   Eyes: Negative.  Negative for blurred vision, double vision, photophobia and pain.  Respiratory: Negative.  Negative for cough, sputum production, shortness of breath and wheezing.   Cardiovascular: Negative.  Negative for chest pain, palpitations, claudication,  leg swelling and PND.  Gastrointestinal: Negative.  Negative for abdominal pain, constipation, diarrhea, heartburn, nausea and vomiting.  Genitourinary: Negative.  Negative for frequency and urgency.  Musculoskeletal: Positive for joint pain (shoulder). Negative for back pain and myalgias.       Left arm and hand pain.  Skin: Negative.  Negative for itching.  Neurological: Positive for tremors (occasional in hand). Negative for dizziness, tingling, sensory change,  weakness and headaches.  Endo/Heme/Allergies: Negative.  Does not bruise/bleed easily.  Psychiatric/Behavioral: Negative.  Negative for depression and memory loss. The patient is not nervous/anxious and does not have insomnia.   All other systems reviewed and are negative.  Performance status (ECOG): 1   Physical Exam  Constitutional: She is oriented to person, place, and time. She appears well-developed and well-nourished. No distress.  HENT:  Head: Normocephalic and atraumatic.  Dark hair.  Wearing a mask.  Eyes: Conjunctivae and EOM are normal. No scleral icterus.  Glasses.  Brown eyes.  Neck: Normal range of motion. Neck supple. No JVD present.  Cardiovascular: Normal rate, regular rhythm and normal heart sounds. Exam reveals no gallop and no friction rub.  No murmur heard. Pulmonary/Chest: Effort normal and breath sounds normal. No respiratory distress. She has no wheezes. She has no rales.  Abdominal: Soft. Bowel sounds are normal. She exhibits no distension. There is no abdominal tenderness.  Musculoskeletal: Normal range of motion.        General: No tenderness or edema.  Lymphadenopathy:    She has no cervical adenopathy.       Right: No supraclavicular adenopathy present.       Left: No supraclavicular adenopathy present.  Neurological: She is alert and oriented to person, place, and time.  Skin: Skin is warm and dry. No rash noted. She is not diaphoretic. No erythema. No pallor.  Psychiatric: She has a normal mood and affect. Her behavior is normal. Judgment and thought content normal.  Nursing note and vitals reviewed.   No visits with results within 3 Day(s) from this visit.  Latest known visit with results is:  Office Visit on 01/16/2019  Component Date Value Ref Range Status   CRP 01/16/2019 1.9* <1.0 mg/dL Final   Performed at [facility], [address] [place], [place] [address]   HCV Ab 01/16/2019 0.1  0.0 - 0.9 s/co ratio Final   Comment: (NOTE)                                   Negative:     < 0.8                             Indeterminate: 0.8 - 0.9                                  Positive:     > 0.9 The CDC recommends that a positive HCV antibody result be followed up with a HCV Nucleic Acid Amplification test ([id number]). Performed At: [facility] [place] [address] [place], [place] [id number] Jolene SchimkeNagendra Sanjai MD GN:[id number]:[phone]    Hepatitis B Surface Ag 01/16/2019 Negative  Negative Final   Comment: (NOTE) Performed At: [facility] [place] [address] [place], [place] [id number] Jolene SchimkeNagendra Sanjai MD WU:[id number]:[phone]    Hep B Core Total Ab 01/16/2019 Negative  Negative Final   Comment: (NOTE) Performed At: [facility] [place], [place] [id number] Rush Farmer MD BP:[id number]    Sed Rate 01/16/2019 8  0 - 30 mm/hr Final   Performed at [facility], [address], [place], [place] [address]    Assessment:  Mckenzye Cutright is a 63 y.o. female with hereditary hemochromatosis (H63D heterozygote) diagnosed in 11/2015.    Ferritin has been followed: 485 on 11/18/2015, 332 on 12/02/2015, 387 on 01/14/2017, 340 on 01/10/2018, 598 on 05/15/2018, and 454 on 01/09/2019.   Sed rate and CRP were normal on 01/16/2019.  Iron saturation has been followed: 38% on 11/18/2015, 29% on 01/14/2017, 45% on 01/10/2018, 37% on 05/15/2018, 23% on 01/09/2019.   Abdominal ultrasound at [facility] on 05/26/2018 revealed probable hepatic fatty infiltration and focal fatty sparing.  RUQ ultrasound on 01/30/2019 revealed minimal sludge within gallbladder. There was questionable fatty infiltration of liver with potential 3.1 cm diameter mass lesion versus area of focal sparing.  MRI was recommended to exclude hepatic mass/maligancy.     AFP has ben followed:  5.2 on 01/09/2019.  Hepatitis B and C serologies were negative on 01/16/2019.  Symptomatically, she noted some mild discomfort in her left arm and hand.  She  denies any chest pain or shortness of breath.  Plan: 1.   Review labs from 01/16/2019. 2.   Hereditary hemochromatosis             She has one copy of H63D mutation                         Patients with one copy of H63D are typically carriers and do not have manifestations of iron overload unless they carry a rare mutation (not tested).  Iron saturation is 23%.   Patients with HH typically have iron saturation >= 40%.  Ferritin is an acute phase reactant and can be elevated in inflammation.   However, CRP and sed rate were normal on 01/16/2019.             Patient has no signs or symptoms of iron overload.  Discuss interval RUQ ultrasound    There was fatty infiltration of liver with potential 3.1 cm mass lesion versus area of focal sparing.    Discuss plans for liver MRI to assess lesion.   Anticipate MRI will estimate iron deposition in liver (LIC > 3-7 Fe/g indicates iron overload).  Continue surveillance ultrasound and AFP every 6 months. 3.   Schedule liver MRI. 4.   RTC after liver MRI for MD assessment and review of imaging.  I discussed the assessment and treatment plan with the patient.  The patient was provided an opportunity to ask questions and all were answered.  The patient agreed with the plan and demonstrated an understanding of the instructions.  The patient was advised to call back if the symptoms worsen or if the condition fails to improve as anticipated.   Lequita Asal, MD, PhD    02/05/2019, 9:11 AM

## 2019-02-05 ENCOUNTER — Other Ambulatory Visit: Payer: Self-pay

## 2019-02-05 ENCOUNTER — Encounter: Payer: Self-pay | Admitting: Hematology and Oncology

## 2019-02-05 ENCOUNTER — Inpatient Hospital Stay: Payer: Medicaid Other | Attending: Hematology and Oncology | Admitting: Hematology and Oncology

## 2019-02-05 DIAGNOSIS — K769 Liver disease, unspecified: Secondary | ICD-10-CM

## 2019-02-05 NOTE — Progress Notes (Signed)
Patient c/o left arm pain today ( pain level 3) she states with the pain to left shoulder it goes to her head and cause her to be nasusa at times.The patient states the pain in constant.

## 2019-02-21 ENCOUNTER — Ambulatory Visit
Admission: RE | Admit: 2019-02-21 | Discharge: 2019-02-21 | Disposition: A | Discharge status: Home / Self Care | Payer: Medicaid Other | Source: Ambulatory Visit | Attending: Hematology and Oncology | Admitting: Hematology and Oncology

## 2019-02-21 ENCOUNTER — Other Ambulatory Visit: Payer: Self-pay

## 2019-02-21 LAB — POCT I-STAT CREATININE: Creatinine, Ser: 0.8 mg/dL (ref 0.44–1.00)

## 2019-02-21 MED ORDER — GADOBUTROL 1 MMOL/ML IV SOLN
6.0000 mL | Freq: Once | INTRAVENOUS | Status: AC | PRN
  Administered 2019-02-21: 6 mL via INTRAVENOUS

## 2019-02-21 NOTE — Progress Notes (Signed)
[facility]  [address], [address] [place], [place] [address] Phone: [phone]  Fax: [phone]   Clinic Day:  02/26/2019  Referring physician: Lyndon CodeKhan, Fozia M, MD  Chief Complaint: Tammy Lam is a 63 y.o. female with hemochromatosis (H63D heterozygote) who is seen for 3 week assessment.  HPI: The patient was last seen in the medical oncology clinic on 02/05/2019. At that time, she noted some mild discomfort in her left arm and hand.  She denied any chest pain or shortness of breath.  Iron saturation was 23%. Ferritin was 454 with a normal CRP and sed rate.  RUQ ultrasound revealed questionable fatty infiltration of the liver with a 3 cm diameter mass lesion versus an area of focal sparing.  MRI was recommended.  Liver MRI on 02/21/2019 revealed moderate to severe hepatic steatosis. There was focal fatty sparing in the central left lobe corresponding with the lesion seen on US. There was no evidence of hepatic neoplasm, no evidence of abnormal iron deposition seen within the liver or other abdominal parenchymal organs.   During the interim, she is doing "okay." She reports pain in her left shoulder, for which she is taking medication and received steroid injections with mild improvement. She reports headaches and neck pain radiating down her arms.    Past Medical History:  Diagnosis Date   Elevated ferritin    Headache    Hemochromatosis    Hyperlipidemia    Hyponatremia    Medical history non-contributory    Shoulder injury     Past Surgical History:  Procedure Laterality Date   ANKLE SURGERY Right     Family History  Problem Relation Age of Onset   Liver disease Other    Breast cancer Neg Hx     Social History:  reports that she has never smoked. She has never used smokeless tobacco. She reports that she does not drink alcohol or use drugs. [place].Her name is  pronounced "zip boy".[place]Her husband is also from [place]. 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 The patient is accompanied by interpreter Lottie MusselKim Lam today.  Allergies:  Allergies  Allergen Reactions   Diphenhydramine Other (See Comments)   Benadryl [Diphenhydramine Hcl] Itching   Influenza Vaccines    Penicillins Itching    Current Medications: Current Outpatient Medications  Medication Sig Dispense Refill   Baclofen 5 MG TABS Take 1 tablet by mouth 2 (two) times a day.      nortriptyline (PAMELOR) 10 MG capsule Take 10 mg by mouth at bedtime. 3 caps po at bedtime     gemfibrozil (LOPID) 600 MG tablet Take 1 tablet (600 mg total) by mouth 2 (two) times daily before a meal. (Patient not taking: Reported on 01/16/2019) 60 tablet 2   No current facility-administered medications for this visit.     Review of Systems  Constitutional: Negative.  Negative for chills, diaphoresis, fever, malaise/fatigue and weight loss.       Feeling "okay."  HENT: Negative.  Negative for congestion, ear pain, hearing loss, nosebleeds, sinus pain and sore throat.   Eyes: Negative.  Negative for blurred vision, double vision, photophobia and pain.  Respiratory: Negative.  Negative for cough, shortness of breath and wheezing.   Cardiovascular: Negative.  Negative for chest pain, palpitations, claudication, leg swelling and PND.  Gastrointestinal: Negative.  Negative for abdominal pain, constipation, diarrhea, heartburn,  nausea and vomiting.  Genitourinary: Negative.  Negative for dysuria, frequency and urgency.  Musculoskeletal: Positive for joint pain (left shoulder) and neck pain (radiating down arms). Negative for back pain and myalgias.  Skin: Negative.  Negative for rash.  Neurological: Positive for headaches. Negative for dizziness, tingling, tremors, sensory change and weakness.  Endo/Heme/Allergies: Negative.  Does  not bruise/bleed easily.  Psychiatric/Behavioral: Negative.  Negative for depression and memory loss. The patient is not nervous/anxious and does not have insomnia.   All other systems reviewed and are negative.  Performance status (ECOG): 1  Blood pressure 125/66, pulse 73, temperature 97.9 F (36.6 C), temperature source Tympanic, resp. rate 18, height 5\' 5"  (1.651 Lam), weight 141 lb 10.3 oz (64.3 kg), SpO2 98 %.   Physical Exam  Constitutional: She is oriented to person, place, and time. She appears well-developed and well-nourished. No distress.  HENT:  Head: Normocephalic and atraumatic.  Dark hair.  Wearing a mask.  Eyes: Conjunctivae and EOM are normal. No scleral icterus.  Glasses.  Brown eyes.  Cardiovascular: Exam reveals no gallop.  Neurological: She is alert and oriented to person, place, and time.  Skin: She is not diaphoretic. No erythema. No pallor.  Psychiatric: She has a normal mood and affect. Judgment and thought content normal.  Nursing note and vitals reviewed.   No visits with results within 3 Day(s) from this visit.  Latest known visit with results is:  Hospital Outpatient Visit on 02/21/2019  Component Date Value Ref Range Status   Creatinine, Ser 02/21/2019 0.80  0.44 - 1.00 mg/dL Final    Assessment:  Tammy Lam is a 63 y.o. female with hereditaryhemochromatosis(H63D heterozygote) diagnosed in 11/2015.   Ferritin has been followed: 485 on 11/18/2015, 332 on 12/02/2015, 387 on 01/14/2017, 340 on 01/10/2018, 598 on 05/15/2018, and 454 on 01/09/2019.   Sed rate and CRP were normal on 01/16/2019.  Iron saturationhas been followed: 38% on 11/18/2015, 29% on 01/14/2017, 45% on 01/10/2018, 37% on 05/15/2018, 23% on 01/09/2019.   [facility]dominal ultrasoundat [facility] on 05/26/2018 revealed probable hepatic fatty infiltration and focal fatty sparing.  RUQ ultrasound on 01/30/2019 revealed minimal sludge within gallbladder. There was questionable fatty  infiltration of liver with potential 3.1 cm diameter mass lesion versus area of focal sparing.  MRI was recommended to exclude hepatic mass/maligancy.   Liver MRI on 02/21/2019 revealed moderate to severe hepatic steatosis. There was focal fatty sparing in the central left lobe corresponding with the lesion seen on ultrasound. There was no evidence of hepatic neoplasm, no evidence of abnormal iron deposition seen within the liver or other abdominal parenchymal organs.    AFPhas ben followed:5.2 on 01/09/2019.  Hepatitis B and C serologies were negative on 01/16/2019.  Symptomatically, she notes pain in her left shoulder.  She denies any abdominal complaints.  Plan: 1.    Review liver MRI.  2.  Hereditary hemochromatosis Testing on 04/11/217 revealed one copy of H63D mutation Carrierstypically donot have manifestations of iron overload unless they carry a rare mutation (not tested). Iron saturation >= 40% for evidence of iron overload. Ferritin is an acute phase reactant and can be elevated in inflammation. She has no signs or symptoms of iron overload Fatigue, joint pain, liver injury, diabetes, cardiac issues, increased skin pigmentation. RUQ ultrasound revealed a 3.1 cm area of questionable fat infiltration.  Liver MRI on 02/21/2019 was personally reviewed.  Agree with radiology interpretation.   She has moderate to severe hepatic steatosis.   There is an  area of focal fatty sparing corresponding to the ultrasound.   There is no evidence of abnormal iron deposition.   Continue surveillance RUQ ultrasound and AFP every 6 months.   AFP was normal on 01/09/2019. Hepatitis B and C testing was negative. 3.   Hepatitis steatosis  MRI suggests moderate to severe hepatic steatosis.  Discuss follow-up with Dr Beverely RisenFozia Khan.  Consider GI referral. 4.    RUQ ultrasound 08/01/2019. 5.   RTC after ultrasound for MD assessment, labs (CBC with diff, CMP, ferritin, iron studies, AFP), and review of ultrasound.  I discussed the assessment and treatment plan with the patient.  The patient was provided an opportunity to ask questions and all were answered.  The patient agreed with the plan and demonstrated an understanding of the instructions.  The patient was advised to call back if the symptoms worsen or if the condition fails to improve as anticipated.    Rosey BathMelissa C Corcoran, MD, PhD    02/26/2019, 9:04 AM  I, Molly Dorshimer, am acting as Neurosurgeonscribe for General MotorsMelissa C. Merlene Pullingorcoran, MD, PhD.  I, Melissa C. Merlene Pullingorcoran, MD, have reviewed the above documentation for accuracy and completeness, and I agree with the above.

## 2019-02-26 ENCOUNTER — Other Ambulatory Visit: Payer: Self-pay

## 2019-02-26 ENCOUNTER — Inpatient Hospital Stay: Payer: Medicaid Other | Attending: Hematology and Oncology | Admitting: Hematology and Oncology

## 2019-02-26 ENCOUNTER — Encounter: Payer: Self-pay | Admitting: Hematology and Oncology

## 2019-02-26 DIAGNOSIS — K769 Liver disease, unspecified: Secondary | ICD-10-CM

## 2019-02-26 DIAGNOSIS — K760 Fatty (change of) liver, not elsewhere classified: Secondary | ICD-10-CM

## 2019-03-12 DIAGNOSIS — K760 Fatty (change of) liver, not elsewhere classified: Secondary | ICD-10-CM | POA: Insufficient documentation

## 2019-07-20 ENCOUNTER — Other Ambulatory Visit: Payer: Medicaid Other

## 2019-07-20 ENCOUNTER — Ambulatory Visit: Payer: Medicaid Other | Admitting: Hematology and Oncology

## 2019-08-01 ENCOUNTER — Ambulatory Visit
Admission: RE | Admit: 2019-08-01 | Discharge: 2019-08-01 | Disposition: A | Discharge status: Home / Self Care | Payer: Medicaid Other | Source: Ambulatory Visit | Attending: Hematology and Oncology | Admitting: Hematology and Oncology

## 2019-08-01 ENCOUNTER — Other Ambulatory Visit: Payer: Self-pay

## 2019-08-01 DIAGNOSIS — K769 Liver disease, unspecified: Secondary | ICD-10-CM | POA: Diagnosis present

## 2019-08-06 ENCOUNTER — Inpatient Hospital Stay: Payer: Medicaid Other

## 2019-08-06 ENCOUNTER — Encounter: Payer: Self-pay | Admitting: Oncology

## 2019-08-06 ENCOUNTER — Other Ambulatory Visit: Payer: Self-pay

## 2019-08-06 ENCOUNTER — Inpatient Hospital Stay: Payer: Medicaid Other | Attending: Hematology and Oncology | Admitting: Oncology

## 2019-08-06 DIAGNOSIS — K760 Fatty (change of) liver, not elsewhere classified: Secondary | ICD-10-CM | POA: Insufficient documentation

## 2019-08-06 DIAGNOSIS — E119 Type 2 diabetes mellitus without complications: Secondary | ICD-10-CM | POA: Diagnosis not present

## 2019-08-06 DIAGNOSIS — M25512 Pain in left shoulder: Secondary | ICD-10-CM | POA: Insufficient documentation

## 2019-08-06 DIAGNOSIS — M79602 Pain in left arm: Secondary | ICD-10-CM | POA: Insufficient documentation

## 2019-08-06 DIAGNOSIS — R519 Headache, unspecified: Secondary | ICD-10-CM | POA: Diagnosis not present

## 2019-08-06 DIAGNOSIS — R110 Nausea: Secondary | ICD-10-CM | POA: Diagnosis not present

## 2019-08-06 LAB — CBC WITH DIFFERENTIAL/PLATELET
Abs Immature Granulocytes: 0.02 10*3/uL (ref 0.00–0.07)
Basophils Absolute: 0 10*3/uL (ref 0.0–0.1)
Basophils Relative: 1 %
Eosinophils Absolute: 0.2 10*3/uL (ref 0.0–0.5)
Eosinophils Relative: 4 %
HCT: 46.5 % — ABNORMAL HIGH (ref 36.0–46.0)
Hemoglobin: 15.5 g/dL — ABNORMAL HIGH (ref 12.0–15.0)
Immature Granulocytes: 0 %
Lymphocytes Relative: 43 %
Lymphs Abs: 2.4 10*3/uL (ref 0.7–4.0)
MCH: 30.9 pg (ref 26.0–34.0)
MCHC: 33.3 g/dL (ref 30.0–36.0)
MCV: 92.6 fL (ref 80.0–100.0)
Monocytes Absolute: 0.3 10*3/uL (ref 0.1–1.0)
Monocytes Relative: 6 %
Neutro Abs: 2.5 10*3/uL (ref 1.7–7.7)
Neutrophils Relative %: 46 %
Platelets: 152 10*3/uL (ref 150–400)
RBC: 5.02 MIL/uL (ref 3.87–5.11)
RDW: 12.3 % (ref 11.5–15.5)
WBC: 5.5 10*3/uL (ref 4.0–10.5)
nRBC: 0 % (ref 0.0–0.2)

## 2019-08-06 LAB — COMPREHENSIVE METABOLIC PANEL
ALT: 39 U/L (ref 0–44)
AST: 33 U/L (ref 15–41)
Albumin: 4.2 g/dL (ref 3.5–5.0)
Alkaline Phosphatase: 89 U/L (ref 38–126)
Anion gap: 8 (ref 5–15)
BUN: 14 mg/dL (ref 8–23)
CO2: 26 mmol/L (ref 22–32)
Calcium: 9.3 mg/dL (ref 8.9–10.3)
Chloride: 105 mmol/L (ref 98–111)
Creatinine, Ser: 0.79 mg/dL (ref 0.44–1.00)
GFR calc Af Amer: >60 mL/min (ref >60)
GFR calc non Af Amer: >60 mL/min (ref >60)
Glucose, Bld: 91 mg/dL (ref 70–99)
Potassium: 3.5 mmol/L (ref 3.5–5.1)
Sodium: 139 mmol/L (ref 135–145)
Total Bilirubin: 0.7 mg/dL (ref 0.3–1.2)
Total Protein: 7.4 g/dL (ref 6.5–8.1)

## 2019-08-06 LAB — FERRITIN: Ferritin: 442 ng/mL — ABNORMAL HIGH (ref 11–307)

## 2019-08-06 NOTE — Progress Notes (Signed)
[facility]  [address] [place], [place] [address] Phone: [phone]  Fax: [phone]   Clinic Day:  08/06/2019  Referring physician: Lyndon Code, MD  Chief Complaint: Tammy Lam is a 63 y.o. female with hemochromatosis (H63D heterozygote) who is seen for 6 month assessment.  HPI: The patient was last seen in the medical oncology clinic on 02/26/2019.  At that time she reported pain in her left shoulder which was improved since receiving steroid injections.  She reported mild headaches and neck pain that radiated down her arm.  During the interim, she has been doing "okay".  She continues to have persistent left arm pain but states it is "better" than previous.  See's Dr. Malvin Johns in neurology. She states approximately 10 days ago, she developed some intermittent left-sided facial pain, headache when she experiences her left arm pain.  She also admitted to some nausea during these episodes.  She is concerned of cardiac issues.  Reports having an EKG and echocardiogram approximately 4 years ago. She denies any palpitation or chest pain.   Past Medical History:  Diagnosis Date  . Elevated ferritin   . Headache   . Hemochromatosis   . Hyperlipidemia   . Hyponatremia   . Medical history non-contributory   . Shoulder injury     Past Surgical History:  Procedure Laterality Date  . ANKLE SURGERY Right     Family History  Problem Relation Age of Onset  . Liver disease Other   . Breast cancer Neg Hx     Social History:  reports that she has never smoked. She has never used smokeless tobacco. She reports that she does not drink alcohol or use drugs. She is originally from [place] came to the [place] 25 years ago as a refugee.Her name is pronounced "zip boy".[place]Her husband is also from [place]. 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 The patient is accompanied by  interpreter Lottie Mussel today.  Allergies:  Allergies  Allergen Reactions  . Diphenhydramine Other (See Comments)  . Benadryl [Diphenhydramine Hcl] Itching  . Influenza Vaccines   . Penicillins Itching    Current Medications: Current Outpatient Medications  Medication Sig Dispense Refill  . Baclofen 5 MG TABS Take 1 tablet by mouth 2 (two) times a day.     Marland Kitchen gemfibrozil (LOPID) 600 MG tablet Take 1 tablet (600 mg total) by mouth 2 (two) times daily before a meal. (Patient not taking: Reported on 01/16/2019) 60 tablet 2  . nortriptyline (PAMELOR) 10 MG capsule Take 10 mg by mouth at bedtime. 3 caps po at bedtime     No current facility-administered medications for this visit.    Review of Systems  Constitutional: Negative.  Negative for chills, fever, malaise/fatigue and weight loss.  HENT: Negative for congestion, ear pain and tinnitus.   Eyes: Negative.  Negative for blurred vision and double vision.  Respiratory: Negative.  Negative for cough, sputum production and shortness of breath.   Cardiovascular: Negative.  Negative for chest pain, palpitations and leg swelling.       Left arm pain-improved  Gastrointestinal: Positive for nausea. Negative for abdominal pain, constipation, diarrhea and vomiting.  Genitourinary: Negative for dysuria, frequency and urgency.  Musculoskeletal: Negative for back pain and falls.  Skin: Negative.  Negative for rash.  Neurological: Positive for headaches. Negative for weakness.  Endo/Heme/Allergies: Negative.  Does not bruise/bleed easily.  Psychiatric/Behavioral: Negative.  Negative for depression. The patient  is not nervous/anxious and does not have insomnia.    Performance status (ECOG): 1  There were no vitals taken for this visit.   Physical Exam  Constitutional: She is oriented to person, place, and time. Vital signs are normal. She appears well-developed and well-nourished.  HENT:  Head: Normocephalic and atraumatic.  Eyes: Pupils  are equal, round, and reactive to light.  Cardiovascular: Normal rate and regular rhythm.  No murmur heard. Pulmonary/Chest: Effort normal and breath sounds normal. She has no wheezes.  Abdominal: Soft. Bowel sounds are normal. She exhibits no distension and no mass. There is no abdominal tenderness.  Musculoskeletal:        General: No edema. Normal range of motion.     Cervical back: Normal range of motion.  Neurological: She is alert and oriented to person, place, and time.  Skin: Skin is warm and dry.  Psychiatric: She has a normal mood and affect. Her behavior is normal.    Appointment on 08/06/2019  Component Date Value Ref Range Status  . WBC 08/06/2019 5.5  4.0 - 10.5 K/uL Final  . RBC 08/06/2019 5.02  3.87 - 5.11 MIL/uL Final  . Hemoglobin 08/06/2019 15.5* 12.0 - 15.0 g/dL Final  . HCT 08/06/2019 46.5* 36.0 - 46.0 % Final  . MCV 08/06/2019 92.6  80.0 - 100.0 fL Final  . MCH 08/06/2019 30.9  26.0 - 34.0 pg Final  . MCHC 08/06/2019 33.3  30.0 - 36.0 g/dL Final  . RDW 08/06/2019 12.3  11.5 - 15.5 % Final  . Platelets 08/06/2019 152  150 - 400 K/uL Final  . nRBC 08/06/2019 0.0  0.0 - 0.2 % Final  . Neutrophils Relative % 08/06/2019 46  % Final  . Neutro Abs 08/06/2019 2.5  1.7 - 7.7 K/uL Final  . Lymphocytes Relative 08/06/2019 43  % Final  . Lymphs Abs 08/06/2019 2.4  0.7 - 4.0 K/uL Final  . Monocytes Relative 08/06/2019 6  % Final  . Monocytes Absolute 08/06/2019 0.3  0.1 - 1.0 K/uL Final  . Eosinophils Relative 08/06/2019 4  % Final  . Eosinophils Absolute 08/06/2019 0.2  0.0 - 0.5 K/uL Final  . Basophils Relative 08/06/2019 1  % Final  . Basophils Absolute 08/06/2019 0.0  0.0 - 0.1 K/uL Final  . Immature Granulocytes 08/06/2019 0  % Final  . Abs Immature Granulocytes 08/06/2019 0.02  0.00 - 0.07 K/uL Final   Performed at [facility], [address], [place], [place] [address]    Assessment:  Tammy Lam is a 63 y.o. female with  hereditaryhemochromatosis(H63D heterozygote) diagnosed in 11/2015.   Ferritin has been followed: 485 on 11/18/2015, 332 on 12/02/2015, 387 on 01/14/2017, 340 on 01/10/2018, 598 on 05/15/2018, and 454 on 01/09/2019.   Sed rate and CRP were normal on 01/16/2019.  Iron saturationhas been followed: 38% on 11/18/2015, 29% on 01/14/2017, 45% on 01/10/2018, 37% on 05/15/2018, 23% on 01/09/2019.   Abdominal ultrasoundat [facility] on 05/26/2018 revealed probable hepatic fatty infiltration and focal fatty sparing.  RUQ ultrasound on 01/30/2019 revealed minimal sludge within gallbladder. There was questionable fatty infiltration of liver with potential 3.1 cm diameter mass lesion versus area of focal sparing.  MRI was recommended to exclude hepatic mass/maligancy.   Liver MRI on 02/21/2019 revealed moderate to severe hepatic steatosis. There was focal fatty sparing in the central left lobe corresponding with the lesion seen on ultrasound. There was no evidence of hepatic neoplasm, no evidence of abnormal iron deposition seen within  the liver or other abdominal parenchymal organs.    AFPhas ben followed:5.2 on 01/09/2019.  Hepatitis B and C serologies were negative on 01/16/2019.  Symptomatically, she notes pain in her left shoulder, intermittent left facial pain and headache. Has occasional nausea.  She denies any abdominal complaints.  Plan: 1.    Review liver Ultrasound and labwork (CBC, CMP, Ferritin and AFP) 2.  Hereditary hemochromatosis Testing on 04/11/217 revealed one copy of H63D mutation Carrierstypically donot have manifestations of iron overload unless they carry a rare mutation (not tested). Iron saturation >= 40% for evidence of iron overload. Ferritin is an acute phase reactant and can be elevated in inflammation. She has no signs or symptoms of iron  overload Fatigue, joint pain, liver injury, diabetes, increased skin pigmentation.  C/o chronic left arm pain, nausea, left facial pain and numbness. RUQ ultrasound revealed stable hepatic steatosis.  Liver MRI on 02/21/2019 revealed moderate to severe hepatic steatosis.   There is an area of focal fatty sparing corresponding to the ultrasound.   There is no evidence of abnormal iron deposition.   Continue surveillance RUQ ultrasound and AFP every 6 months.   AFP was normal on 01/09/2019; pending from today. Hepatitis B and C testing was negative. 3.   Hepatitis steatosis  RUQ ultrasound suggest hepatic steatosis.   Discuss follow-up with Dr Beverely RisenFozia Khan.  Consider GI referral. 4.   Chronic left arm and shoulder pain  Followed by neurology Dr. Malvin JohnsPotter.  Currently taking baclofen 5 mg twice daily and nortriptyline 30 mg nightly.  Notes headache at last visit thought to be related to shoulder pain. 5.    Left sided facial pain with nausea  Started approximately 10 days ago; intermittent  Will check EKG given ferritin levels are not quite back yet and elevated ferritin levels can cause cause cardiac  concerns.  EKG: Normal sinus rhythm.  If persistent, would recommend follow-up with cardiology or neurology. 6.   RUQ ultrasound in 6 months. 7.   RTC after ultrasound for MD assessment, labs (CBC with diff, CMP, ferritin, iron studies, AFP), and review of ultrasound.  I discussed the assessment and treatment plan with the patient.  The patient was provided an opportunity to ask questions and all were answered.  The patient agreed with the plan and demonstrated an understanding of the instructions.  The patient was advised to call back if the symptoms worsen or if the condition fails to improve as anticipated.   Greater than 50% was spent in counseling and coordination of care with this patient including but not limited to discussion of the relevant  topics above (See A&P) including, but not limited to diagnosis and management of acute and chronic medical conditions.   Durenda HurtJennifer Kacey Dysert, NP 08/06/2019 10:28 AM

## 2019-08-06 NOTE — Progress Notes (Signed)
The patient c/o left arm pain with nausea, half of her head pain on left side,  but no chest pain , no vomiting, patient also states the pain comes and goes.

## 2019-08-07 LAB — AFP TUMOR MARKER: AFP, Serum, Tumor Marker: 6.7 ng/mL (ref 0.0–8.3)

## 2019-09-18 IMAGING — MG DIGITAL SCREENING BILATERAL MAMMOGRAM WITH TOMO AND CAD
8 series · 9 of 24 positions shown · non-contrast
Comparison: Previous exam(s).

CLINICAL DATA: Screening.

EXAM:
DIGITAL SCREENING BILATERAL MAMMOGRAM WITH TOMO AND CAD

[L CC synth-2D]
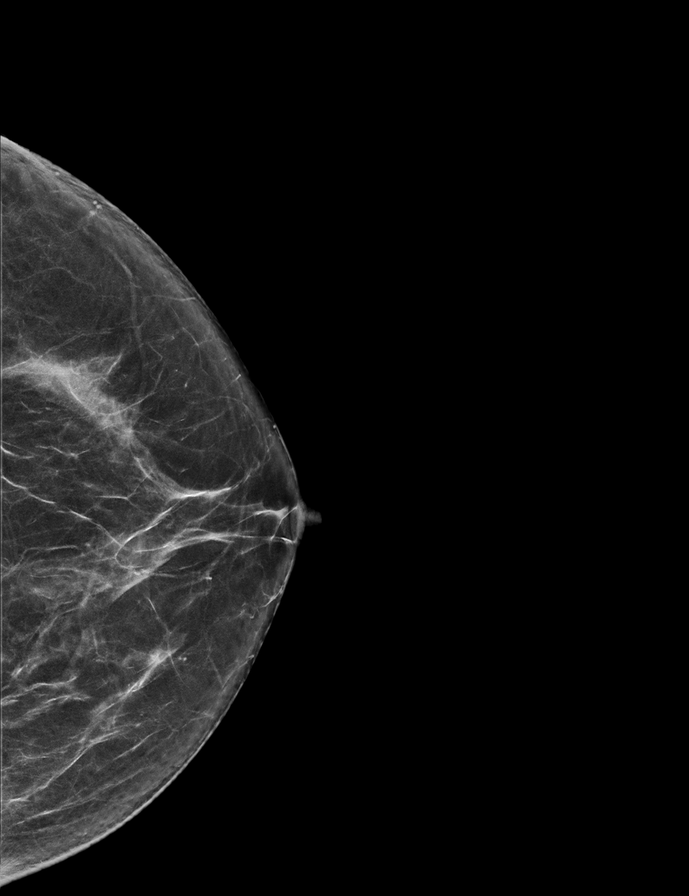

[R CC synth-2D]
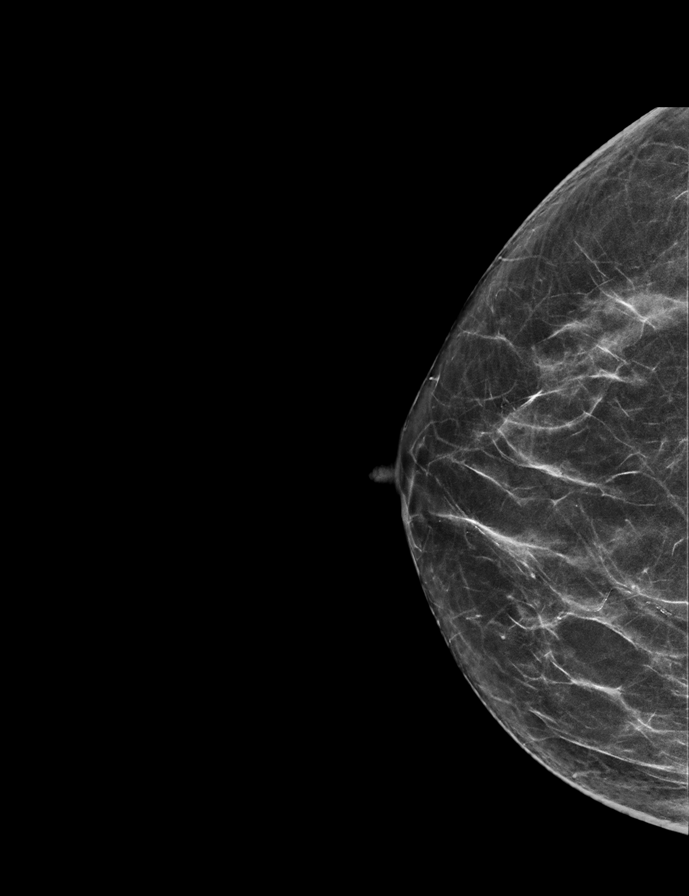

[L MLO synth-2D]
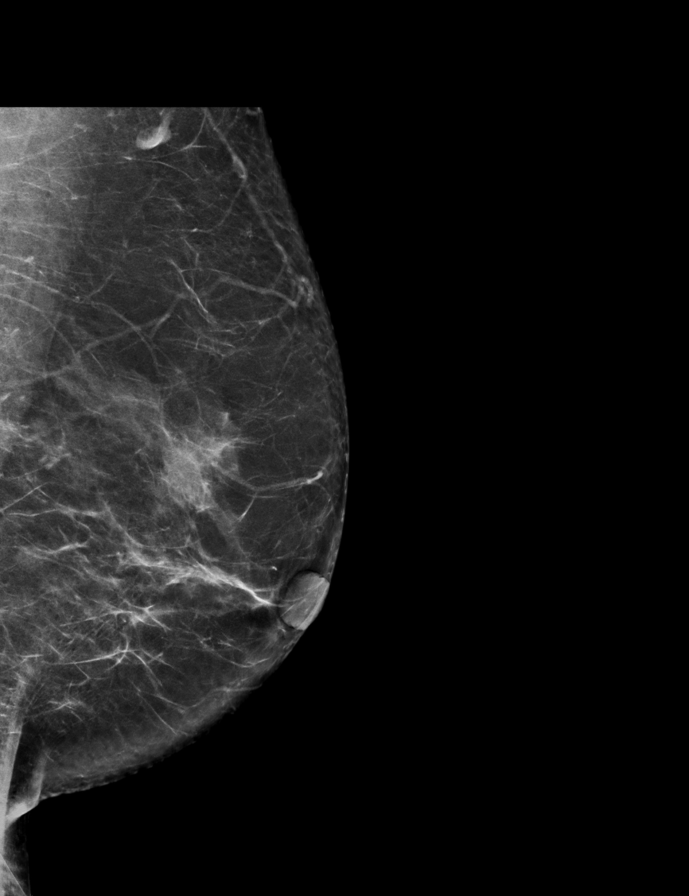

[R MLO synth-2D]
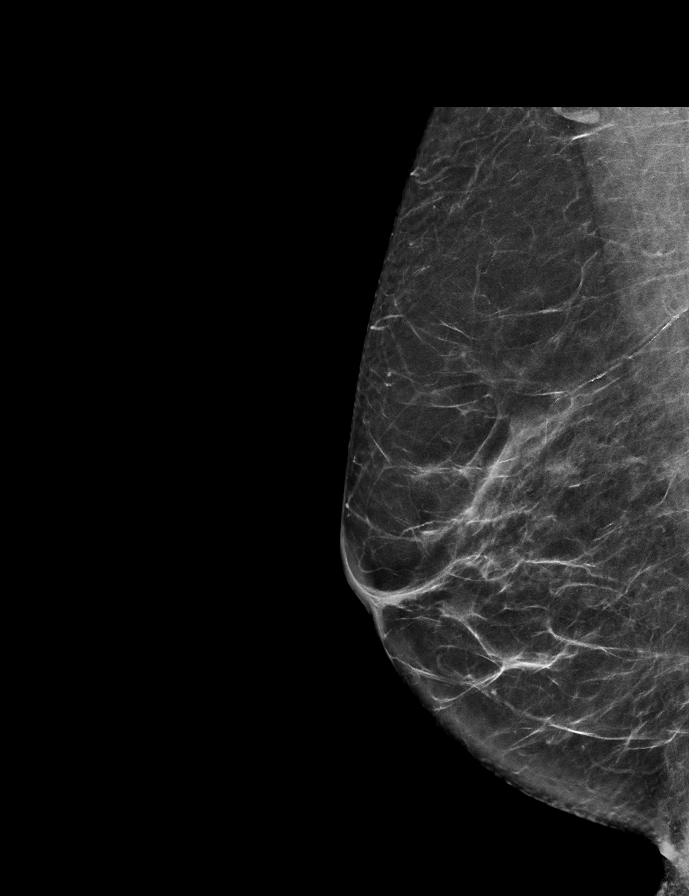

[L MLO tomo · 2 of 71 frames shown]
[frame 23/71]
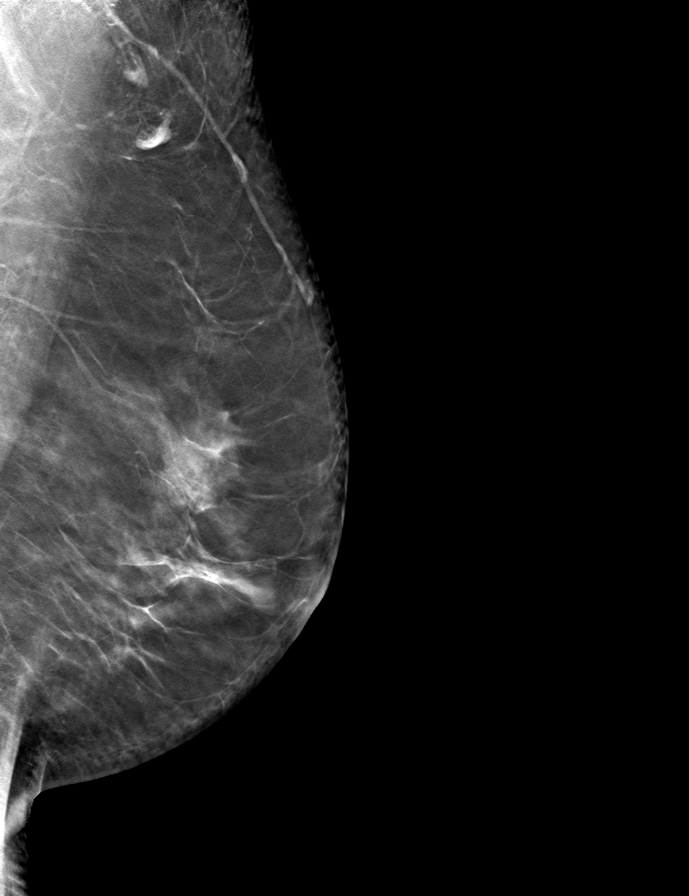
[frame 36/71]
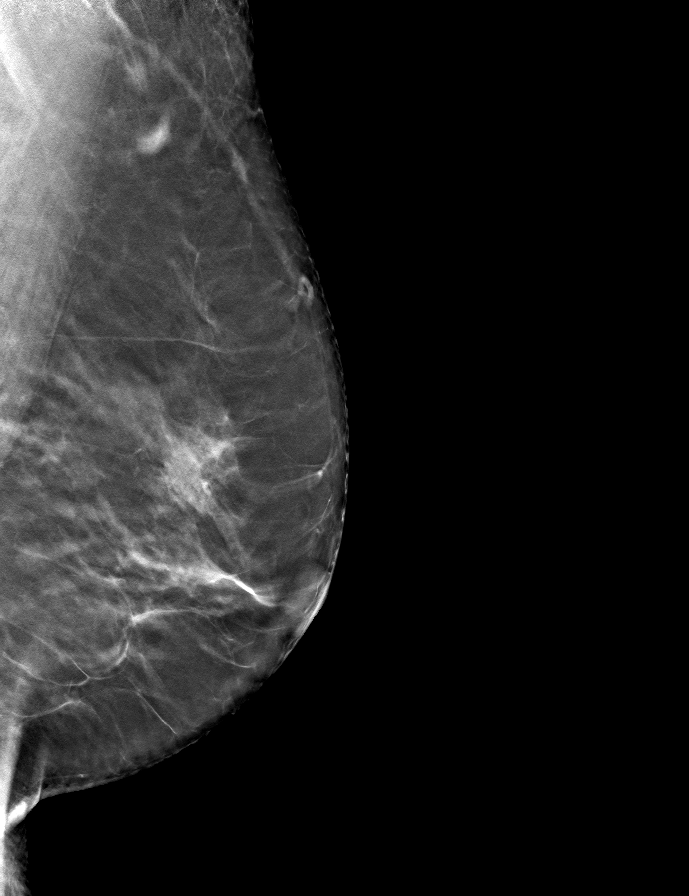

[R MLO tomo · tomo slice 34/67.0]
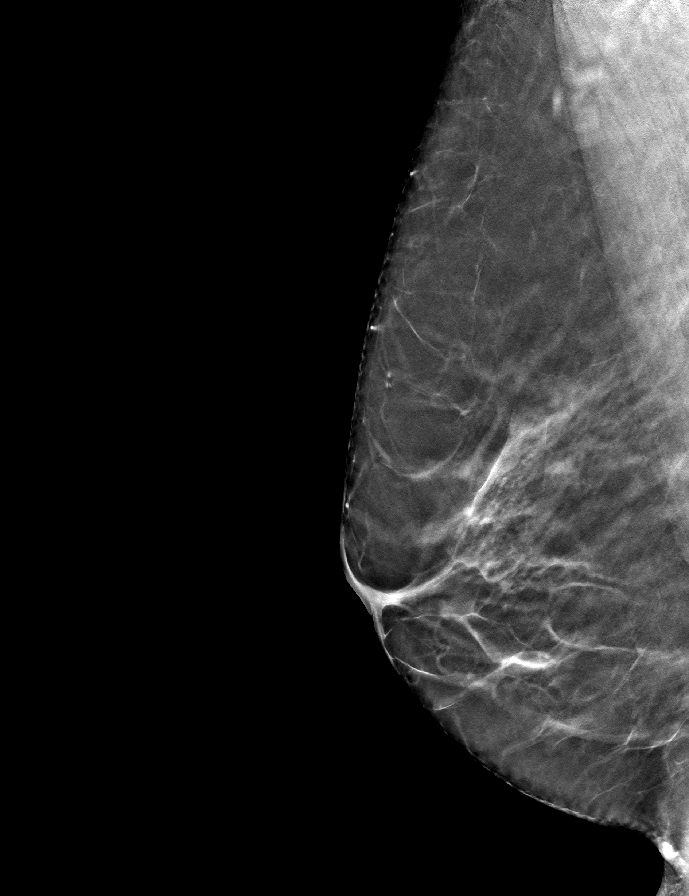

[R CC tomo · tomo slice 31/60.0]
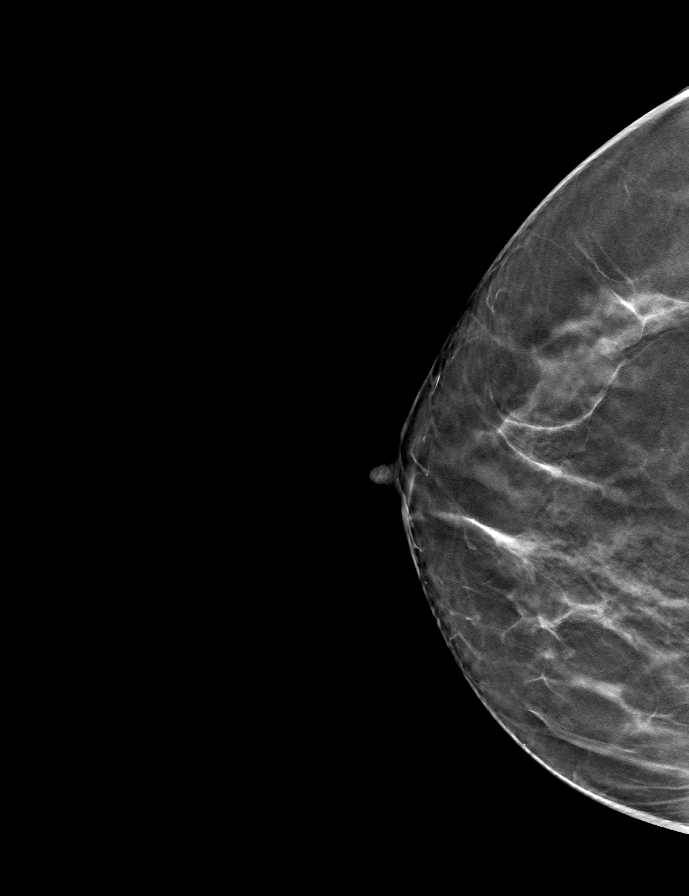

[L CC tomo · tomo slice 31/61.0]
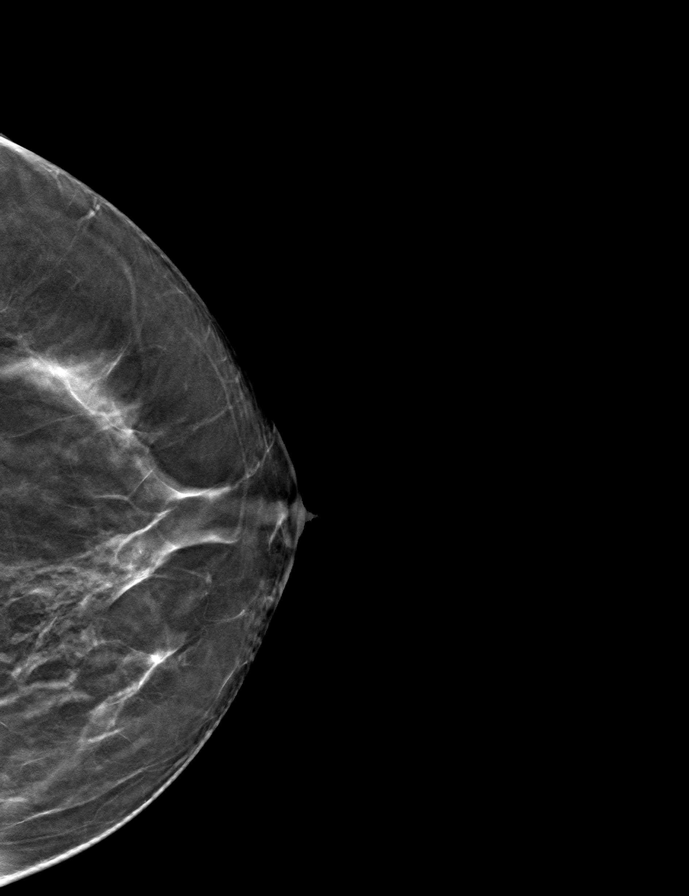

[9 of 24 positions shown; findings below may reference images not displayed]

ACR Breast Density Category b: There are scattered areas of
fibroglandular density.
FINDINGS: There are no findings suspicious for malignancy. Images were
processed with CAD.
IMPRESSION: No mammographic evidence of malignancy. A result letter of this
screening mammogram will be mailed directly to the patient.

RECOMMENDATION:
Screening mammogram in one year. (Code:[id number])

BI-RADS CATEGORY  1: Negative.

## 2019-11-01 ENCOUNTER — Telehealth: Payer: Self-pay

## 2019-11-02 NOTE — Telephone Encounter (Signed)
Can you find out what kind of allergic reaction she has had to flu shot? Thanks.

## 2019-11-02 NOTE — Telephone Encounter (Signed)
Left message to pt that what kind of allergic reaction they have with flu vaccine

## 2019-11-02 NOTE — Telephone Encounter (Signed)
Pt  Husband was notified

## 2019-11-02 NOTE — Telephone Encounter (Signed)
From what I have read, if she had this type of allergy she should be cautious about getting COVID vaccine. I would probably advise against it for now.

## 2020-02-04 ENCOUNTER — Other Ambulatory Visit: Payer: Self-pay

## 2020-02-04 ENCOUNTER — Ambulatory Visit
Admission: RE | Admit: 2020-02-04 | Discharge: 2020-02-04 | Disposition: A | Discharge status: Home / Self Care | Payer: Medicaid Other | Source: Ambulatory Visit | Attending: Oncology | Admitting: Oncology

## 2020-02-04 NOTE — Progress Notes (Signed)
[facility]  [address] [place], [place] [address] Phone: [phone]  Fax: [phone]   Clinic Day:  02/05/2020  Referring physician: Lyndon Code, MD  Chief Complaint: Tammy Lam is a 64 y.o. female with hemochromatosis (H63D heterozygote) who is seen for a 6 month assessment.   HPI:  The patient was last seen in the medical oncology clinic on 02/26/2019. At that time, she noted pain in her left shoulder.  She denied any abdominal complaints. We discussed a referral to GI.  RUQ ultrasound in 07/2019 was planned.   RUQ abdominal ultrasound on 08/01/2019 revealed hepatic steatosis with stable areas of sparing along the falciform ligament.  The patient was seen in the medical oncology clinic on 08/06/2019 by Durenda Hurt, NP. At that time, she noted pain in her left shoulder, intermittent left facial pain and headache. Had occasional nausea. She denied any abdominal complaints. Hematocrit 46.5, hemoglobin 15.5, platelets 152,000, WBC 5,500.  Ferritin was 442. CMP was normal. AFP was 6.7.   She was last seen by Dr. Malvin Johns on 12/03/2019 for evaluation of left arm and shoulder pain as well as headache.  Headaches were stable.  She had not been taking nortriptyline as she had run out.  Left arm and shoulder pain felt related to a ligamnet or tendon abnormality.  MRI was ordered and she was referred to orthopedics for a second opinion.  She was seen by Dr Joice Lofts on 01/11/2020.  She was felt to have rotator cuff tendonitis.  She received a subacromial shoulder Kenalog injection.  RUQ abdominal ultrasound on 02/04/2020 revealed hepatic steatosis with areas of sparing.  There were no new or progressive findings.  There was no evidence of gallbladder disease or ductal dilatation.  During the interim, patient stated she was not feeling very good. She had pain in left shoulder that radiated down arm and back pain. She now has pain in her left forearm and her  forearm "feels tired".  Her left wrist and hand feel numb and weak. The pain in her right forearm in less than the pain in her left forearm. Sometimes has trouble dropping objects. Sometimes she has pain in her whole back s/p steroid injection in shoulder. She feels like symptoms are related to the steroid shot.   She is eating soup but no solids. Patient does not have an appetite. Reports some nausea and heartburn. She has headaches sometimes. Patient received the COVID-19 vaccine x 3 months ago with associated fatigue.   I discussed possible referral to GI for her fatty liver.    Past Medical History:  Diagnosis Date  . Elevated ferritin   . Headache   . Hemochromatosis   . Hyperlipidemia   . Hyponatremia   . Medical history non-contributory   . Shoulder injury     Past Surgical History:  Procedure Laterality Date  . ANKLE SURGERY Right     Family History  Problem Relation Age of Onset  . Liver disease Other   . Breast cancer Neg Hx     Social History:  reports that she has never smoked. She has never used smokeless tobacco. She reports that she does not drink alcohol and does not use drugs. She is originally from [place] came to the [place] 25 years ago as a refugee.Her name is pronounced "zip boy".[place]Her husband is also from [place]. 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 The patient is accompanied by interpreter Selena Batten  Antony Salmon today.  Allergies:  Allergies  Allergen Reactions  . Diphenhydramine Other (See Comments)  . Benadryl [Diphenhydramine Hcl] Itching  . Influenza Vaccines   . Penicillins Itching    Current Medications: Current Outpatient Medications  Medication Sig Dispense Refill  . Baclofen 5 MG TABS Take 1 tablet by mouth 2 (two) times a day.  (Patient not taking: Reported on 02/05/2020)    . gemfibrozil (LOPID) 600 MG tablet Take 1 tablet (600 mg total) by mouth 2 (two) times daily  before a meal. (Patient not taking: Reported on 01/16/2019) 60 tablet 2  . nortriptyline (PAMELOR) 10 MG capsule Take 10 mg by mouth at bedtime. 3 caps po at bedtime (Patient not taking: Reported on 02/05/2020)     No current facility-administered medications for this visit.    Review of Systems  Constitutional: Positive for weight loss (3 lbs). Negative for chills, diaphoresis, fever and malaise/fatigue.       Not feeling good.  HENT: Negative.  Negative for congestion, ear pain, hearing loss, nosebleeds, sinus pain and sore throat.   Eyes: Negative.  Negative for blurred vision, double vision, photophobia and pain.  Respiratory: Negative.  Negative for cough, shortness of breath and wheezing.   Cardiovascular: Positive for chest pain (on occasion s/p steroid shot). Negative for palpitations, claudication, leg swelling and PND.  Gastrointestinal: Positive for heartburn and nausea. Negative for abdominal pain, constipation, diarrhea and vomiting.       No appetite. Eating soups, no solids.   Genitourinary: Negative.  Negative for dysuria, frequency and urgency.  Musculoskeletal: Positive for back pain (whole back s/p steroid shot) and myalgias (left>right forearm; "feels tired"). Negative for joint pain (left shoulder, resolved) and neck pain.  Skin: Negative.  Negative for rash.  Neurological: Positive for sensory change (numbness in left wrist and hand) and weakness (hands). Negative for dizziness, tingling, tremors and headaches (off and on).       Dropping objects sometimes.  Endo/Heme/Allergies: Negative.  Does not bruise/bleed easily.  Psychiatric/Behavioral: Negative.  Negative for depression and memory loss. The patient is not nervous/anxious and does not have insomnia.   All other systems reviewed and are negative.  Performance status (ECOG): 1-2  Vitals: Blood pressure 138/79, pulse 65, temperature (!) 96.4 F (35.8 C), temperature source Tympanic, resp. rate 16, weight 142 lb  10.2 oz (64.7 kg), SpO2 98 %.   Physical Exam Vitals and nursing note reviewed.  Constitutional:      General: She is not in acute distress.    Appearance: Normal appearance. She is well-developed. She is not diaphoretic.     Interventions: Face mask in place.  HENT:     Head: Normocephalic and atraumatic.     Mouth/Throat:     Mouth: Mucous membranes are moist.     Pharynx: Oropharynx is clear.  Eyes:     General: No scleral icterus.    Extraocular Movements: Extraocular movements intact.     Conjunctiva/sclera: Conjunctivae normal.     Pupils: Pupils are equal, round, and reactive to light.     Comments: Glasses.  Brown eyes.  Cardiovascular:     Rate and Rhythm: Normal rate and regular rhythm.     Pulses: Normal pulses.     Heart sounds: Normal heart sounds. No murmur heard.  No gallop.   Pulmonary:     Effort: Pulmonary effort is normal. No respiratory distress.     Breath sounds: Normal breath sounds. No wheezing or rales.  Chest:  Chest wall: No tenderness.  Abdominal:     General: Bowel sounds are normal. There is no distension.     Palpations: Abdomen is soft. There is no mass.     Tenderness: There is no abdominal tenderness. There is no guarding.  Musculoskeletal:        General: No swelling or tenderness. Normal range of motion.     Cervical back: Normal range of motion and neck supple.  Lymphadenopathy:     Head:     Right side of head: No preauricular, posterior auricular or occipital adenopathy.     Left side of head: No preauricular, posterior auricular or occipital adenopathy.     Cervical: No cervical adenopathy.     Upper Body:     Right upper body: No supraclavicular adenopathy.     Left upper body: No supraclavicular adenopathy.     Lower Body: No right inguinal adenopathy. No left inguinal adenopathy.  Skin:    General: Skin is warm and dry.     Coloration: Skin is not pale.     Findings: No erythema.  Neurological:     Mental Status: She is  alert and oriented to person, place, and time. Mental status is at baseline.     Comments: Weak grip due to pain.  Psychiatric:        Mood and Affect: Mood normal.        Behavior: Behavior normal.        Thought Content: Thought content normal.        Judgment: Judgment normal.    Appointment on 02/05/2020  Component Date Value Ref Range Status  . Sodium 02/05/2020 140  135 - 145 mmol/L Final  . Potassium 02/05/2020 3.7  3.5 - 5.1 mmol/L Final  . Chloride 02/05/2020 107  98 - 111 mmol/L Final  . CO2 02/05/2020 26  22 - 32 mmol/L Final  . Glucose, Bld 02/05/2020 100* 70 - 99 mg/dL Final   Glucose reference range applies only to samples taken after fasting for at least 8 hours.  . BUN 02/05/2020 15  8 - 23 mg/dL Final  . Creatinine, Ser 02/05/2020 0.84  0.44 - 1.00 mg/dL Final  . Calcium 02/05/2020 9.1  8.9 - 10.3 mg/dL Final  . Total Protein 02/05/2020 7.2  6.5 - 8.1 g/dL Final  . Albumin 02/05/2020 4.0  3.5 - 5.0 g/dL Final  . AST 02/05/2020 23  15 - 41 U/L Final  . ALT 02/05/2020 25  0 - 44 U/L Final  . Alkaline Phosphatase 02/05/2020 81  38 - 126 U/L Final  . Total Bilirubin 02/05/2020 0.6  0.3 - 1.2 mg/dL Final  . GFR calc non Af Amer 02/05/2020 >60  >60 mL/min Final  . GFR calc Af Amer 02/05/2020 >60  >60 mL/min Final  . Anion gap 02/05/2020 7  5 - 15 Final   Performed at [facility], [address] [place], [place] [address]  . WBC 02/05/2020 5.2  4.0 - 10.5 K/uL Final  . RBC 02/05/2020 4.98  3.87 - 5.11 MIL/uL Final  . Hemoglobin 02/05/2020 15.4* 12.0 - 15.0 g/dL Final  . HCT 02/05/2020 45.6  36 - 46 % Final  . MCV 02/05/2020 91.6  80.0 - 100.0 fL Final  . MCH 02/05/2020 30.9  26.0 - 34.0 pg Final  . MCHC 02/05/2020 33.8  30.0 - 36.0 g/dL Final  . RDW 02/05/2020 12.0  11.5 - 15.5 % Final  . Platelets 02/05/2020 174  150 - 400 K/uL Final  . nRBC 02/05/2020 0.0  0.0 - 0.2 % Final  . Neutrophils Relative % 02/05/2020 53  % Final  . Neutro Abs 02/05/2020  2.8  1.7 - 7.7 K/uL Final  . Lymphocytes Relative 02/05/2020 37  % Final  . Lymphs Abs 02/05/2020 1.9  0.7 - 4.0 K/uL Final  . Monocytes Relative 02/05/2020 6  % Final  . Monocytes Absolute 02/05/2020 0.3  0 - 1 K/uL Final  . Eosinophils Relative 02/05/2020 3  % Final  . Eosinophils Absolute 02/05/2020 0.2  0 - 0 K/uL Final  . Basophils Relative 02/05/2020 1  % Final  . Basophils Absolute 02/05/2020 0.0  0 - 0 K/uL Final  . Immature Granulocytes 02/05/2020 0  % Final  . Abs Immature Granulocytes 02/05/2020 0.01  0.00 - 0.07 K/uL Final   Performed at [facility], [address] [place], [place] [id number]    Assessment:  Harless LittenDiep Snedden is a 64 y.o. female with hereditaryhemochromatosis(H63D heterozygote) diagnosed in 11/2015.   Ferritin has been followed: 485 on 11/18/2015, 332 on 12/02/2015, 387 on 01/14/2017, 340 on 01/10/2018, 598 on 05/15/2018, 454 on 01/09/2019, 442 on 08/06/2019, 424 on 02/05/2020.   Sed rate and CRP were normal on 01/16/2019.  Iron saturationhas been followed: 38% on 11/18/2015, 29% on 01/14/2017, 45% on 01/10/2018, 37% on 05/15/2018, 23% on 01/09/2019.   [facility]at [facility] on 05/26/2018 revealed probable hepatic fatty infiltration and focal fatty sparing.  RUQ ultrasound on 01/30/2019 revealed minimal sludge within gallbladder. There was questionable fatty infiltration of liver with potential 3.1 cm diameter mass lesion versus area of focal sparing.  MRI was recommended to exclude hepatic mass/maligancy. RUQ abdominal ultrasound on 02/04/2020 revealed hepatic steatosis with areas of sparing.  There were no new or progressive findings.  There was no evidence of gallbladder disease or ductal dilatation.  Liver MRI on 02/21/2019 revealed moderate to severe hepatic steatosis. There was focal fatty sparing in the central left lobe corresponding with the lesion seen on ultrasound. There was no evidence of hepatic neoplasm, no evidence of  abnormal iron deposition seen within the liver or other abdominal parenchymal organs.    AFPhas ben followed:5.2 on 01/09/2019 and 4.9 on 02/05/2020.  Hepatitis B and C serologies were negative on 01/16/2019.  Patient received the COVID-19 vaccine months ago   Symptomatically, she has chronic pain in her left shoulder.  Handgrip is decreased.  Plan: 1.    Labs today: CBC with diff, CMP, ferritin, iron studies, AFP.  Exam reveals slight decreased grip. 2.  Hereditary hemochromatosis She has 1 copy of H63D mutation Carrierstypically donot have manifestations of iron overload unless they carry a rare mutation (not tested). Iron saturation >= 40% for evidence of iron overload. Ferritin is an acute phase reactant and can be elevated in inflammation. She continues to have no signs or symptoms of iron overload. Fatigue, joint pain, liver injury, diabetes, cardiac issues, increased skin pigmentation.   Doubt shoulder discomfort due to iron deposition. Liver MRI on 02/21/2019 revealed no evidence of abnormal iron tests deposition   RUQ ultrasound revealed a 3.1 cm area of questionable fat infiltration.  Liver MRI on 02/21/2019 was personally reviewed.  Agree with radiology interpretation.   She has moderate to severe hepatic steatosis.   There is an area of focal fatty sparing corresponding to the ultrasound.   There is no evidence of abnormal iron deposition.   RUQ abdominal ultrasound on 02/04/2020 revealed hepatic steatosis  with areas of sparing.    Continue right upper quadrant ultrasound and AFP surveillance every 6 months.   AFP is 4.9 on 02/05/2020. Hepatitis B and C testing were negative. 3.   Hepatitis steatosis  MRI on 02/21/2019 revealed moderate to severe hepatic steatosis without excess iron deposition.  Discuss consideration of GI  referral. 4.   Decreased hand grip  Patient noted to have decreased strength in hand.  Patient to schedule follow-up with neurologist. 5.   Schedule right upper quadrant ultrasound on 08/06/2020. 6.   RTC after ultrasound for MD assessment, labs (CBC with diff, CMP, ferritin, iron studies, AFP) and review of ultrasound.  I discussed the assessment and treatment plan with the patient.  The patient was provided an opportunity to ask questions and all were answered.  The patient agreed with the plan and demonstrated an understanding of the instructions.  The patient was advised to call back if the symptoms worsen or if the condition fails to improve as anticipated.   I provided 20 minutes of face-to-face time during this this encounter and > 50% was spent counseling as documented under my assessment and plan.  I provided these services from the [facility] office.   Rosey Bath, MD, PhD    02/05/2020, 9:16 AM  I, Theador Hawthorne, am acting as scribe for [employer]. Merlene Pulling, MD, PhD.  I, Ayah Cozzolino C. Merlene Pulling, MD, have reviewed the above documentation for accuracy and completeness, and I agree with the above.

## 2020-02-05 ENCOUNTER — Inpatient Hospital Stay (HOSPITAL_BASED_OUTPATIENT_CLINIC_OR_DEPARTMENT_OTHER): Payer: Medicaid Other | Admitting: Hematology and Oncology

## 2020-02-05 ENCOUNTER — Inpatient Hospital Stay: Payer: Medicaid Other | Attending: Hematology and Oncology

## 2020-02-05 ENCOUNTER — Encounter: Payer: Self-pay | Admitting: Hematology and Oncology

## 2020-02-05 DIAGNOSIS — E119 Type 2 diabetes mellitus without complications: Secondary | ICD-10-CM | POA: Diagnosis not present

## 2020-02-05 DIAGNOSIS — K760 Fatty (change of) liver, not elsewhere classified: Secondary | ICD-10-CM | POA: Insufficient documentation

## 2020-02-05 DIAGNOSIS — R5383 Other fatigue: Secondary | ICD-10-CM | POA: Insufficient documentation

## 2020-02-05 DIAGNOSIS — M25512 Pain in left shoulder: Secondary | ICD-10-CM | POA: Diagnosis not present

## 2020-02-05 DIAGNOSIS — E785 Hyperlipidemia, unspecified: Secondary | ICD-10-CM | POA: Diagnosis not present

## 2020-02-05 LAB — CBC WITH DIFFERENTIAL/PLATELET
Abs Immature Granulocytes: 0.01 10*3/uL (ref 0.00–0.07)
Basophils Absolute: 0 10*3/uL (ref 0.0–0.1)
Basophils Relative: 1 %
Eosinophils Absolute: 0.2 10*3/uL (ref 0.0–0.5)
Eosinophils Relative: 3 %
HCT: 45.6 % (ref 36.0–46.0)
Hemoglobin: 15.4 g/dL — ABNORMAL HIGH (ref 12.0–15.0)
Immature Granulocytes: 0 %
Lymphocytes Relative: 37 %
Lymphs Abs: 1.9 10*3/uL (ref 0.7–4.0)
MCH: 30.9 pg (ref 26.0–34.0)
MCHC: 33.8 g/dL (ref 30.0–36.0)
MCV: 91.6 fL (ref 80.0–100.0)
Monocytes Absolute: 0.3 10*3/uL (ref 0.1–1.0)
Monocytes Relative: 6 %
Neutro Abs: 2.8 10*3/uL (ref 1.7–7.7)
Neutrophils Relative %: 53 %
Platelets: 174 10*3/uL (ref 150–400)
RBC: 4.98 MIL/uL (ref 3.87–5.11)
RDW: 12 % (ref 11.5–15.5)
WBC: 5.2 10*3/uL (ref 4.0–10.5)
nRBC: 0 % (ref 0.0–0.2)

## 2020-02-05 LAB — IRON AND TIBC
Iron: 119 ug/dL (ref 28–170)
Saturation Ratios: 34 % — ABNORMAL HIGH (ref 10.4–31.8)
TIBC: 347 ug/dL (ref 250–450)
UIBC: 228 ug/dL

## 2020-02-05 LAB — COMPREHENSIVE METABOLIC PANEL
ALT: 25 U/L (ref 0–44)
AST: 23 U/L (ref 15–41)
Albumin: 4 g/dL (ref 3.5–5.0)
Alkaline Phosphatase: 81 U/L (ref 38–126)
Anion gap: 7 (ref 5–15)
BUN: 15 mg/dL (ref 8–23)
CO2: 26 mmol/L (ref 22–32)
Calcium: 9.1 mg/dL (ref 8.9–10.3)
Chloride: 107 mmol/L (ref 98–111)
Creatinine, Ser: 0.84 mg/dL (ref 0.44–1.00)
GFR calc Af Amer: >60 mL/min (ref >60)
GFR calc non Af Amer: >60 mL/min (ref >60)
Glucose, Bld: 100 mg/dL — ABNORMAL HIGH (ref 70–99)
Potassium: 3.7 mmol/L (ref 3.5–5.1)
Sodium: 140 mmol/L (ref 135–145)
Total Bilirubin: 0.6 mg/dL (ref 0.3–1.2)
Total Protein: 7.2 g/dL (ref 6.5–8.1)

## 2020-02-05 LAB — FERRITIN: Ferritin: 424 ng/mL — ABNORMAL HIGH (ref 11–307)

## 2020-02-05 NOTE — Progress Notes (Signed)
Pain in left shoulder that radiates down arm and back pain. Sometimes has pain in heart after steroid shot in shoulder. She is eating soup, no solids. No appetite and some nausea

## 2020-02-05 NOTE — Patient Instructions (Signed)
  Patient to schedule follow-up with neurologist.

## 2020-02-06 LAB — AFP TUMOR MARKER: AFP, Serum, Tumor Marker: 4.9 ng/mL (ref 0.0–8.3)

## 2020-07-09 ENCOUNTER — Other Ambulatory Visit: Payer: Self-pay

## 2020-07-09 ENCOUNTER — Encounter: Payer: Self-pay | Admitting: Hospice and Palliative Medicine

## 2020-07-09 ENCOUNTER — Ambulatory Visit: Payer: Medicaid Other | Admitting: Hospice and Palliative Medicine

## 2020-07-09 DIAGNOSIS — Z124 Encounter for screening for malignant neoplasm of cervix: Secondary | ICD-10-CM | POA: Diagnosis not present

## 2020-07-09 DIAGNOSIS — R531 Weakness: Secondary | ICD-10-CM | POA: Diagnosis not present

## 2020-07-09 DIAGNOSIS — Z0001 Encounter for general adult medical examination with abnormal findings: Secondary | ICD-10-CM | POA: Diagnosis not present

## 2020-07-09 DIAGNOSIS — E782 Mixed hyperlipidemia: Secondary | ICD-10-CM

## 2020-07-09 DIAGNOSIS — G479 Sleep disorder, unspecified: Secondary | ICD-10-CM

## 2020-07-09 DIAGNOSIS — Z113 Encounter for screening for infections with a predominantly sexual mode of transmission: Secondary | ICD-10-CM

## 2020-07-09 DIAGNOSIS — R300 Dysuria: Secondary | ICD-10-CM

## 2020-07-09 NOTE — Progress Notes (Signed)
[facility] [address] [place], [place] [address]  Internal MEDICINE  Office Visit Note  Patient Name: Tammy Lam  [id number]  [id number]  Date of Service: 07/14/2020  Chief Complaint  Patient presents with  . Annual Exam    pt wants blookd work done  . Hyperlipidemia  . policy update form    received     HPI Pt is here for routine health maintenance examination Husband is present in room during exam--Tammy Lam speaks very little [place], husband speaks broken [place], difficulty communicating during exam Overall, she and her husband report things have been going well for her, no recent changes in her medications or history They are requesting to have her labs drawn for this year--lab slip given Followed by neurology as well as ortho for chronic shoulder pain, causing numbness and tingling to left face and arm-has completed PT, pain managed with nortriptyline and massage therapy Followed by hematology for hemochromatosis-stable, following repeat US results  Abdominal US 02/04/2020 Steatosis of the liver as seen previously, with areas of sparing. No new or progressive finding. No evidence of gallbladder disease or ductal dilatation.  Discussed needing a colonoscopy for routine screening--she declines at this time, not interested in screening for colon cancer Also not wanting to repeat her mammogram at this time  Husband wanted to discuss her sleeping habits, she does not sleep well,has been ongoing for several years, he reports that she does snore at night and has noticed she does have pauses in her breathing She feels tired during the day and reports when she sits down she does find herself nodding off  Current Medication: Outpatient Encounter Medications as of 07/09/2020  Medication Sig  . nortriptyline (PAMELOR) 10 MG capsule Take 10 mg by mouth at bedtime. 3 caps po at bedtime   . [DISCONTINUED] Baclofen 5 MG TABS Take 1 tablet by mouth 2 (two) times a day.   (Patient not taking: Reported on 02/05/2020)  . [DISCONTINUED] gemfibrozil (LOPID) 600 MG tablet Take 1 tablet (600 mg total) by mouth 2 (two) times daily before a meal. (Patient not taking: Reported on 01/16/2019)   No facility-administered encounter medications on file as of 07/09/2020.    Surgical History: Past Surgical History:  Procedure Laterality Date  . ANKLE SURGERY Right     Medical History: Past Medical History:  Diagnosis Date  . Elevated ferritin   . Headache   . Hemochromatosis   . Hyperlipidemia   . Hyponatremia   . Medical history non-contributory   . Shoulder injury     Family History: Family History  Problem Relation Age of Onset  . Liver disease Other   . Breast cancer Neg Hx       Review of Systems  Constitutional: Negative for chills, diaphoresis and fatigue.  HENT: Negative for ear pain, postnasal drip and sinus pressure.   Eyes: Negative for photophobia, discharge, redness, itching and visual disturbance.  Respiratory: Negative for cough, shortness of breath and wheezing.   Cardiovascular: Negative for chest pain, palpitations and leg swelling.  Gastrointestinal: Negative for abdominal pain, constipation, diarrhea, nausea and vomiting.  Genitourinary: Negative for dysuria and flank pain.  Musculoskeletal: Positive for neck pain and neck stiffness. Negative for arthralgias, back pain and gait problem.  Skin: Negative for color change.  Allergic/Immunologic: Negative for environmental allergies and food allergies.  Neurological: Positive for numbness. Negative for dizziness and headaches.       Left side of face and left arm intermittent numbness and tingling  Hematological: Does not bruise/bleed easily.  Psychiatric/Behavioral: Negative for agitation, behavioral problems (depression) and hallucinations.     Vital Signs: BP 116/88   Pulse 75   Temp (!) 97.4 F (36.3 C)   Resp 16   Ht 5\' 5"  (1.651 m)   Wt 150 lb 6.4 oz (68.2 kg)   LMP  (LMP  Unknown)   SpO2 98%   BMI 25.03 kg/m    Physical Exam Vitals reviewed. Exam conducted with a chaperone present.  Constitutional:      Appearance: Normal appearance.  HENT:     Right Ear: Tympanic membrane normal.     Left Ear: Tympanic membrane normal.     Nose: Nose normal.     Mouth/Throat:     Mouth: Mucous membranes are moist.     Pharynx: Oropharynx is clear.  Eyes:     Pupils: Pupils are equal, round, and reactive to light.  Cardiovascular:     Rate and Rhythm: Normal rate and regular rhythm.     Pulses: Normal pulses.     Heart sounds: Normal heart sounds.  Pulmonary:     Effort: Pulmonary effort is normal.     Breath sounds: Normal breath sounds.  Chest:     Breasts:        Right: Normal.        Left: Normal.  Abdominal:     General: Abdomen is flat.     Palpations: Abdomen is soft.  Genitourinary:    Vagina: Normal.     Cervix: Normal.     Uterus: Normal.   Musculoskeletal:        General: Normal range of motion.  Skin:    General: Skin is warm.  Neurological:     General: No focal deficit present.     Mental Status: She is alert and oriented to person, place, and time. Mental status is at baseline.  Psychiatric:        Mood and Affect: Mood normal.        Behavior: Behavior normal.        Thought Content: Thought content normal.      LABS: Recent Results (from the past 2160 hour(s))  UA/M w/rflx Culture, Routine     Status: Abnormal   Collection Time: 07/09/20 12:23 PM   Specimen: Urine   Urine  Result Value Ref Range   Specific Gravity, UA 1.024 1.005 - 1.030   pH, UA 5.5 5.0 - 7.5   Color, UA Yellow Yellow   Appearance Ur Turbid (A) Clear   Leukocytes,UA Negative Negative   Protein,UA Negative Negative/Trace   Glucose, UA Negative Negative   Ketones, UA Negative Negative   RBC, UA Negative Negative   Bilirubin, UA Negative Negative   Urobilinogen, Ur 0.2 0.2 - 1.0 mg/dL   Nitrite, UA Negative Negative   Microscopic Examination Comment      Comment: Microscopic follows if indicated.   Microscopic Examination See below:     Comment: Microscopic was indicated and was performed.   Urinalysis Reflex Comment     Comment: This specimen will not reflex to a Urine Culture.  Microscopic Examination     Status: Abnormal   Collection Time: 07/09/20 12:23 PM   Urine  Result Value Ref Range   WBC, UA 0-5 0 - 5 /hpf   RBC 0-2 0 - 2 /hpf   Epithelial Cells (non renal) 0-10 0 - 10 /hpf   Casts None seen None seen /lpf   Crystals Present (A)  N/A   Crystal Type Calcium Oxalate N/A   Bacteria, UA None seen None seen/Few  NuSwab Vaginitis Plus (VG+)     Status: None   Collection Time: 07/09/20 12:26 PM  Result Value Ref Range   Atopobium vaginae Low - 0 Score   BVAB 2 Low - 0 Score   Megasphaera 1 Low - 0 Score    Comment: Calculate total score by adding the 3 individual bacterial vaginosis (BV) marker scores together.  Total score is interpreted as follows: Total score 0-1: Indicates the absence of BV. Total score   2: Indeterminate for BV. Additional clinical                  data should be evaluated to establish a                  diagnosis. Total score 3-6: Indicates the presence of BV. This test was developed and its performance characteristics determined by [facility].  It has not been cleared or approved by the Food and Drug Administration.    Candida albicans, NAA Negative Negative   Candida glabrata, NAA Negative Negative   Trich vag by NAA Negative Negative   Chlamydia trachomatis, NAA Negative Negative   Neisseria gonorrhoeae, NAA Negative Negative     Assessment/Plan: 1. Encounter for routine adult health examination with abnormal findings Well appearing 64 year old [place] female Lab slip given for routine annual labs--will review and adjust plan of care as indicated Refusing colonoscopy and mammogram screening today--will need to continue with discussions at next visit  2. Sleep disturbance Will test for  OSA due to witnessed apnea as well as excessive daytime sleepiness - Home sleep test  3. Mixed hyperlipidemia Will review updated labs and adjust plan of care as indicated  4. Left-sided weakness Continue to be followed and managed by neurology as well as orhp//PT and massage therapy  5. Hereditary hemochromatosis (HCC) Followed and managed by hematology Continue with routine monitoring of liver US--stable at this time  6. Dysuria - UA/M w/rflx Culture, Routine - Microscopic Examination  7. Routine cervical smear - IGP, Aptima HPV - NuSwab Vaginitis Plus (VG+)  8. Screen for STD (sexually transmitted disease) - NuSwab Vaginitis Plus (VG+)  General Counseling: Bruchy verbalizes understanding of the findings of todays visit and agrees with plan of treatment. I have discussed any further diagnostic evaluation that may be needed or ordered today. We also reviewed her medications today. she has been encouraged to call the office with any questions or concerns that should arise related to todays visit.    Counseling:    Orders Placed This Encounter  Procedures  . Microscopic Examination  . UA/M w/rflx Culture, Routine  . NuSwab Vaginitis Plus (VG+)  . NuSwab Vaginitis Plus (VG+)  . Home sleep test      Total time spent: 30 Minutes  Time spent includes review of chart, medications, test results, and follow up plan with the patient.   This patient was seen by Brent General AGNP-C Collaboration with Dr Lyndon Code as a part of collaborative care agreement   Lubertha Basque. [facility] Internal Medicine

## 2020-07-10 ENCOUNTER — Other Ambulatory Visit: Payer: Self-pay | Admitting: Hospice and Palliative Medicine

## 2020-07-10 LAB — UA/M W/RFLX CULTURE, ROUTINE
Bilirubin, UA: NEGATIVE
Glucose, UA: NEGATIVE
Ketones, UA: NEGATIVE
Leukocytes,UA: NEGATIVE
Nitrite, UA: NEGATIVE
Protein,UA: NEGATIVE
RBC, UA: NEGATIVE
Specific Gravity, UA: 1.024 (ref 1.005–1.030)
Urobilinogen, Ur: 0.2 mg/dL (ref 0.2–1.0)
pH, UA: 5.5 (ref 5.0–7.5)

## 2020-07-10 LAB — MICROSCOPIC EXAMINATION
Bacteria, UA: NONE SEEN
Casts: NONE SEEN /lpf

## 2020-07-11 LAB — LIPID PANEL WITH LDL/HDL RATIO
Cholesterol, Total: 299 mg/dL — ABNORMAL HIGH (ref 100–199)
HDL: 40 mg/dL (ref >39)
LDL Chol Calc (NIH): 137 mg/dL — ABNORMAL HIGH (ref 0–99)
LDL/HDL Ratio: 3.4 ratio — ABNORMAL HIGH (ref 0.0–3.2)
Triglycerides: 644 mg/dL (ref 0–149)
VLDL Cholesterol Cal: 122 mg/dL — ABNORMAL HIGH (ref 5–40)

## 2020-07-11 LAB — CBC WITH DIFFERENTIAL/PLATELET

## 2020-07-11 LAB — COMPREHENSIVE METABOLIC PANEL
ALT: 31 IU/L (ref 0–32)
AST: 32 IU/L (ref 0–40)
Albumin/Globulin Ratio: 1.8 (ref 1.2–2.2)
Albumin: 4.5 g/dL (ref 3.8–4.8)
Alkaline Phosphatase: 121 IU/L (ref 44–121)
BUN/Creatinine Ratio: 16 (ref 12–28)
BUN: 12 mg/dL (ref 8–27)
Bilirubin Total: 0.5 mg/dL (ref 0.0–1.2)
CO2: 24 mmol/L (ref 20–29)
Calcium: 9.5 mg/dL (ref 8.7–10.3)
Chloride: 103 mmol/L (ref 96–106)
Creatinine, Ser: 0.77 mg/dL (ref 0.57–1.00)
GFR calc Af Amer: 94 mL/min/{1.73_m2} (ref >59)
GFR calc non Af Amer: 82 mL/min/{1.73_m2} (ref >59)
Globulin, Total: 2.5 g/dL (ref 1.5–4.5)
Glucose: 103 mg/dL — ABNORMAL HIGH (ref 65–99)
Potassium: 4.5 mmol/L (ref 3.5–5.2)
Sodium: 143 mmol/L (ref 134–144)
Total Protein: 7 g/dL (ref 6.0–8.5)

## 2020-07-11 LAB — TSH: TSH: 4.98 u[IU]/mL — ABNORMAL HIGH (ref 0.450–4.500)

## 2020-07-11 LAB — T4, FREE: Free T4: 1 ng/dL (ref 0.82–1.77)

## 2020-07-12 LAB — NUSWAB VAGINITIS PLUS (VG+)
Candida albicans, NAA: NEGATIVE
Candida glabrata, NAA: NEGATIVE
Chlamydia trachomatis, NAA: NEGATIVE
Neisseria gonorrhoeae, NAA: NEGATIVE
Trich vag by NAA: NEGATIVE

## 2020-07-14 ENCOUNTER — Encounter: Payer: Self-pay | Admitting: Hospice and Palliative Medicine

## 2020-07-15 LAB — IGP, APTIMA HPV: HPV Aptima: NEGATIVE

## 2020-07-15 NOTE — Progress Notes (Signed)
Normal PAP

## 2020-07-16 NOTE — Progress Notes (Signed)
Patient called, LMOM to schedule appt for next week to discuss labs.

## 2020-07-22 ENCOUNTER — Other Ambulatory Visit: Payer: Self-pay

## 2020-07-22 ENCOUNTER — Ambulatory Visit: Payer: Medicaid Other | Admitting: Internal Medicine

## 2020-07-22 ENCOUNTER — Encounter: Payer: Self-pay | Admitting: Internal Medicine

## 2020-07-22 VITALS — BP 137/66 | HR 70 | Temp 97.1°F | Resp 16 | Ht 65.0 in | Wt 152.0 lb

## 2020-07-22 DIAGNOSIS — G479 Sleep disorder, unspecified: Secondary | ICD-10-CM

## 2020-07-22 DIAGNOSIS — F411 Generalized anxiety disorder: Secondary | ICD-10-CM

## 2020-07-22 DIAGNOSIS — M25412 Effusion, left shoulder: Secondary | ICD-10-CM

## 2020-07-22 DIAGNOSIS — E782 Mixed hyperlipidemia: Secondary | ICD-10-CM | POA: Diagnosis not present

## 2020-07-22 DIAGNOSIS — M25512 Pain in left shoulder: Secondary | ICD-10-CM

## 2020-07-22 MED ORDER — ROSUVASTATIN CALCIUM 10 MG PO TABS: 10.0000 mg | ORAL_TABLET | Freq: Every day | ORAL | 3 refills | Status: DC

## 2020-07-22 MED ORDER — TRAMADOL HCL 50 MG PO TABS: ORAL_TABLET | ORAL | 1 refills | Status: DC

## 2020-07-22 MED ORDER — ESCITALOPRAM OXALATE 10 MG PO TABS: 10.0000 mg | ORAL_TABLET | Freq: Every day | ORAL | 1 refills | Status: DC

## 2020-07-22 NOTE — Progress Notes (Signed)
[facility] [address] [place], [place] [address]  Internal MEDICINE  Office Visit Note  Patient Name: Tammy Lam  [id number]  [id number]  Date of Service: 07/22/2020  Chief Complaint  Patient presents with  . Follow-up    review labs  . Hyperlipidemia  . policy update form    received  . Quality Metric Gaps    colonoscopy    HPI  Pt is here for routine follow up 1. Abnormal Lipid profile, has never been on any medicine  2. Problem sleeping at night due to shoulder pain, husband says that she stops to breath and makes funny sounds and is very restless. Her sleeping medicine does not help. She has difficulty falling asleep, maintaining her sleep, restless  3. Shoulder pain and numbness, has been seen by ortho and neurology, DDD cervical spine  4. Colonoscopy/ BMD due    Current Medication: Outpatient Encounter Medications as of 07/22/2020  Medication Sig  . [DISCONTINUED] nortriptyline (PAMELOR) 10 MG capsule Take 10 mg by mouth at bedtime. 3 caps po at bedtime   . escitalopram (LEXAPRO) 10 MG tablet Take 1 tablet (10 mg total) by mouth at bedtime. For anxiety  . rosuvastatin (CRESTOR) 10 MG tablet Take 1 tablet (10 mg total) by mouth daily.  . traMADol (ULTRAM) 50 MG tablet Take one tab po qhs for shoulder pain   No facility-administered encounter medications on file as of 07/22/2020.    Surgical History: Past Surgical History:  Procedure Laterality Date  . ANKLE SURGERY Right     Medical History: Past Medical History:  Diagnosis Date  . Elevated ferritin   . Headache   . Hemochromatosis   . Hyperlipidemia   . Hyponatremia   . Medical history non-contributory   . Shoulder injury     Family History: Family History  Problem Relation Age of Onset  . Liver disease Other   . Stroke Mother   . Breast cancer Neg Hx     Social History   Socioeconomic History  . Marital status: Married    Spouse name: Not on file  . Number of children: Not  on file  . Years of education: Not on file  . Highest education level: Not on file  Occupational History  . Not on file  Tobacco Use  . Smoking status: Never Smoker  . Smokeless tobacco: Never Used  Vaping Use  . Vaping Use: Never used  Substance and Sexual Activity  . Alcohol use: No    Alcohol/week: 0.0 standard drinks  . Drug use: No  . Sexual activity: Yes    Partners: Male  Other Topics Concern  . Not on file  Social History Narrative  . Not on file   Social Determinants of Health   Financial Resource Strain:   . Difficulty of Paying Living Expenses: Not on file  Food Insecurity:   . Worried About Programme researcher, broadcasting/film/video in the Last Year: Not on file  . Ran Out of Food in the Last Year: Not on file  Transportation Needs:   . Lack of Transportation (Medical): Not on file  . Lack of Transportation (Non-Medical): Not on file  Physical Activity:   . Days of Exercise per Week: Not on file  . Minutes of Exercise per Session: Not on file  Stress:   . Feeling of Stress : Not on file  Social Connections:   . Frequency of Communication with Friends and Family: Not on file  . Frequency of Social  Gatherings with Friends and Family: Not on file  . Attends Religious Services: Not on file  . Active Member of Clubs or Organizations: Not on file  . Attends Banker Meetings: Not on file  . Marital Status: Not on file  Intimate Partner Violence:   . Fear of Current or Ex-Partner: Not on file  . Emotionally Abused: Not on file  . Physically Abused: Not on file  . Sexually Abused: Not on file      Review of Systems  Constitutional: Negative for chills, diaphoresis and fatigue.  HENT: Negative for ear pain, postnasal drip and sinus pressure.   Eyes: Negative for photophobia, discharge, redness, itching and visual disturbance.  Respiratory: Negative for cough, shortness of breath and wheezing.   Cardiovascular: Negative for chest pain, palpitations and leg swelling.   Gastrointestinal: Negative for abdominal pain, constipation, diarrhea, nausea and vomiting.  Genitourinary: Negative for dysuria and flank pain.  Musculoskeletal: Positive for joint swelling and neck pain. Negative for arthralgias, back pain and gait problem.  Skin: Negative for color change.  Allergic/Immunologic: Negative for environmental allergies and food allergies.  Neurological: Positive for numbness. Negative for dizziness and headaches.  Hematological: Does not bruise/bleed easily.  Psychiatric/Behavioral: Positive for dysphoric mood and sleep disturbance. Negative for agitation, behavioral problems (depression) and hallucinations.    Vital Signs: BP 137/66   Pulse 70   Temp (!) 97.1 F (36.2 C)   Resp 16   Ht 5\' 5"  (1.651 m)   Wt 152 lb (68.9 kg)   LMP  (LMP Unknown)   SpO2 96%   BMI 25.29 kg/m    Physical Exam Constitutional:      General: She is not in acute distress.    Appearance: She is well-developed. She is not diaphoretic.  HENT:     Head: Normocephalic and atraumatic.     Mouth/Throat:     Pharynx: No oropharyngeal exudate.  Eyes:     Pupils: Pupils are equal, round, and reactive to light.  Neck:     Thyroid: No thyromegaly.     Vascular: No JVD.     Trachea: No tracheal deviation.  Cardiovascular:     Rate and Rhythm: Normal rate and regular rhythm.     Heart sounds: Normal heart sounds. No murmur heard.  No friction rub. No gallop.   Pulmonary:     Effort: Pulmonary effort is normal. No respiratory distress.     Breath sounds: No wheezing or rales.  Chest:     Chest wall: No tenderness.  Abdominal:     General: Bowel sounds are normal.     Palpations: Abdomen is soft.  Musculoskeletal:        General: Normal range of motion.     Cervical back: Normal range of motion and neck supple.  Lymphadenopathy:     Cervical: No cervical adenopathy.  Skin:    General: Skin is warm and dry.  Neurological:     Mental Status: She is alert and oriented  to person, place, and time.     Cranial Nerves: No cranial nerve deficit.  Psychiatric:        Behavior: Behavior normal.        Thought Content: Thought content normal.        Judgment: Judgment normal.     Assessment/Plan: 1. Mixed hyperlipidemia Pt is willing to start therapy, life style changes  - rosuvastatin (CRESTOR) 10 MG tablet; Take 1 tablet (10 mg total) by mouth daily.  Dispense:  90 tablet; Refill: 3  2. Sleep disturbance The problem of recurrent insomnia is discussed. Avoidance of caffeine sources is strongly encouraged. Sleep hygiene issues are reviewed. Pt has Apnea witnessed by her husband and restless sleep, will check ferritin level. Pain control as well  - PSG Sleep Study; Future  3. GAD (generalized anxiety disorder) Pt has significant symptoms of anxiety, will start on lexapro  - escitalopram (LEXAPRO) 10 MG tablet; Take 1 tablet (10 mg total) by mouth at bedtime. For anxiety  Dispense: 90 tablet; Refill: 1  4. Pain and swelling of left shoulder Followed by Dr Yves Dill for ongoing DDD  - traMADol (ULTRAM) 50 MG tablet; Take one tab po qhs for shoulder pain  Dispense: 30 tablet; Refill: 1  General Counseling: Alysiana verbalizes understanding of the findings of todays visit and agrees with plan of treatment. I have discussed any further diagnostic evaluation that may be needed or ordered today. We also reviewed her medications today. she has been encouraged to call the office with any questions or concerns that should arise related to todays visit.    Orders Placed This Encounter  Procedures  . PSG Sleep Study    Meds ordered this encounter  Medications  . rosuvastatin (CRESTOR) 10 MG tablet    Sig: Take 1 tablet (10 mg total) by mouth daily.    Dispense:  90 tablet    Refill:  3  . escitalopram (LEXAPRO) 10 MG tablet    Sig: Take 1 tablet (10 mg total) by mouth at bedtime. For anxiety    Dispense:  90 tablet    Refill:  1  . traMADol (ULTRAM) 50 MG tablet     Sig: Take one tab po qhs for shoulder pain    Dispense:  30 tablet    Refill:  1    Total time spent:35 Minutes Time spent includes review of chart, medications, test results, and follow up plan with the patient.      Dr Lyndon Code Internal medicine

## 2020-07-23 ENCOUNTER — Ambulatory Visit: Payer: Medicaid Other | Admitting: Hospice and Palliative Medicine

## 2020-07-29 ENCOUNTER — Telehealth: Payer: Self-pay

## 2020-07-29 NOTE — Telephone Encounter (Signed)
All documents for [facility] placed in folder, ready for pick up  

## 2020-07-29 NOTE — Telephone Encounter (Signed)
Pt husband called she was taking crestor before gave her  inflammation as per dr Welton Flakes advised her take once a week until next appt and also advised by thaun

## 2020-08-06 ENCOUNTER — Ambulatory Visit
Admission: RE | Admit: 2020-08-06 | Discharge: 2020-08-06 | Disposition: A | Discharge status: Home / Self Care | Payer: Medicaid Other | Source: Ambulatory Visit | Attending: Hematology and Oncology | Admitting: Hematology and Oncology

## 2020-08-06 ENCOUNTER — Other Ambulatory Visit: Payer: Self-pay

## 2020-08-07 ENCOUNTER — Ambulatory Visit: Payer: Medicaid Other | Admitting: Hematology and Oncology

## 2020-08-07 ENCOUNTER — Other Ambulatory Visit: Payer: Medicaid Other

## 2020-08-11 ENCOUNTER — Inpatient Hospital Stay: Payer: Medicaid Other | Attending: Hematology and Oncology

## 2020-08-11 ENCOUNTER — Other Ambulatory Visit: Payer: Self-pay

## 2020-08-11 ENCOUNTER — Inpatient Hospital Stay: Payer: Medicaid Other | Admitting: Hematology and Oncology

## 2020-08-12 ENCOUNTER — Telehealth: Payer: Self-pay

## 2020-08-12 NOTE — Telephone Encounter (Signed)
Patient has refused to schedule sleep study with [facility]. Tammy Lam

## 2020-09-08 ENCOUNTER — Ambulatory Visit: Payer: Medicaid Other | Admitting: Hospice and Palliative Medicine

## 2020-09-10 ENCOUNTER — Telehealth: Payer: Self-pay

## 2020-09-10 NOTE — Telephone Encounter (Signed)
Left message and asked pt to call back and reschedule appointment from 09/08/20 due to weather.Tammy Lam 

## 2020-09-25 ENCOUNTER — Encounter: Payer: Self-pay | Admitting: Hospice and Palliative Medicine

## 2020-09-25 ENCOUNTER — Ambulatory Visit (INDEPENDENT_AMBULATORY_CARE_PROVIDER_SITE_OTHER): Payer: Medicaid Other | Admitting: Physician Assistant

## 2020-09-25 VITALS — BP 122/80 | HR 80 | Temp 97.2°F | Resp 16 | Ht 65.0 in | Wt 153.0 lb

## 2020-09-25 DIAGNOSIS — E782 Mixed hyperlipidemia: Secondary | ICD-10-CM | POA: Diagnosis not present

## 2020-09-25 DIAGNOSIS — Z1211 Encounter for screening for malignant neoplasm of colon: Secondary | ICD-10-CM

## 2020-09-25 DIAGNOSIS — F411 Generalized anxiety disorder: Secondary | ICD-10-CM

## 2020-09-25 DIAGNOSIS — M25512 Pain in left shoulder: Secondary | ICD-10-CM

## 2020-09-25 DIAGNOSIS — Z1231 Encounter for screening mammogram for malignant neoplasm of breast: Secondary | ICD-10-CM | POA: Diagnosis not present

## 2020-09-25 DIAGNOSIS — M25412 Effusion, left shoulder: Secondary | ICD-10-CM

## 2020-09-25 DIAGNOSIS — G479 Sleep disorder, unspecified: Secondary | ICD-10-CM

## 2020-09-25 NOTE — Progress Notes (Signed)
[facility] [address] [place], [place] [address]  Internal MEDICINE  Office Visit Note  Patient Name: Tammy Lam  [id number]  [id number]  Date of Service: 09/27/2020  Chief Complaint  Patient presents with  . Follow-up    Discuss meds, pt wants to get blood work done  . Hyperlipidemia  . Quality Metric Gaps    Colonoscopy, mammogram    HPI  Pt is here to follow up on medication with her husband. She is doing well on crestor and taking it daily. Her sleep is better, but she did not get the sleep study done because she is afraid of going to the sleep lab due to covid. Her husband reports that she does still snore and wake gasping. Encouraged to set up sleep study. She would be interested in a home study, but her insurance will not cover it. She is taking escitalopram at night and feels her anxiety is improving. She has been having chronic left shoulder pain which she feels if she sleeps on her left side. She is followed by Dr. Yves Dill. She has previously had injections in L shoulder and felt it caused hair loss and does not want more injections. She takes mobic as needed and is followed by neurology. Pt refused colonoscopy, but is open to cologuard, and will also go for a mammogram.  Current Medication: Outpatient Encounter Medications as of 09/25/2020  Medication Sig  . escitalopram (LEXAPRO) 10 MG tablet Take 1 tablet (10 mg total) by mouth at bedtime. For anxiety  . meloxicam (MOBIC) 15 MG tablet Take 15 mg by mouth daily.  . rosuvastatin (CRESTOR) 10 MG tablet Take 1 tablet (10 mg total) by mouth daily.  . traMADol (ULTRAM) 50 MG tablet Take one tab po qhs for shoulder pain   No facility-administered encounter medications on file as of 09/25/2020.    Surgical History: Past Surgical History:  Procedure Laterality Date  . ANKLE SURGERY Right     Medical History: Past Medical History:  Diagnosis Date  . Elevated ferritin   . Headache   . Hemochromatosis   .  Hyperlipidemia   . Hyponatremia   . Medical history non-contributory   . Shoulder injury     Family History: Family History  Problem Relation Age of Onset  . Liver disease Other   . Stroke Mother   . Breast cancer Neg Hx     Social History   Socioeconomic History  . Marital status: Married    Spouse name: Not on file  . Number of children: Not on file  . Years of education: Not on file  . Highest education level: Not on file  Occupational History  . Not on file  Tobacco Use  . Smoking status: Never Smoker  . Smokeless tobacco: Never Used  Vaping Use  . Vaping Use: Never used  Substance and Sexual Activity  . Alcohol use: No    Alcohol/week: 0.0 standard drinks  . Drug use: No  . Sexual activity: Yes    Partners: Male  Other Topics Concern  . Not on file  Social History Narrative  . Not on file   Social Determinants of Health   Financial Resource Strain: Not on file  Food Insecurity: Not on file  Transportation Needs: Not on file  Physical Activity: Not on file  Stress: Not on file  Social Connections: Not on file  Intimate Partner Violence: Not on file      Review of Systems  Constitutional: Negative for  chills, fatigue and unexpected weight change.  HENT: Negative for congestion, postnasal drip, rhinorrhea, sneezing and sore throat.   Eyes: Negative for redness.  Respiratory: Negative for cough, chest tightness and shortness of breath.   Cardiovascular: Negative for chest pain and palpitations.  Gastrointestinal: Negative for abdominal pain, constipation, diarrhea, nausea and vomiting.  Genitourinary: Negative for dysuria and frequency.  Musculoskeletal: Positive for arthralgias. Negative for back pain, joint swelling and neck pain.       Chronic left shoulder pain  Skin: Negative for rash.  Neurological: Negative.  Negative for tremors and numbness.  Hematological: Negative for adenopathy. Does not bruise/bleed easily.  Psychiatric/Behavioral:  Positive for sleep disturbance. Negative for behavioral problems (Depression) and suicidal ideas. The patient is nervous/anxious.        Snores and wakes gasping occasionally, but reports overall sleep is improving    Vital Signs: BP 122/80   Pulse 80   Temp (!) 97.2 F (36.2 C)   Resp 16   Ht 5\' 5"  (1.651 m)   Wt 153 lb (69.4 kg)   LMP  (LMP Unknown)   SpO2 98%   BMI 25.46 kg/m    Physical Exam Constitutional:      General: She is not in acute distress.    Appearance: She is well-developed. She is not diaphoretic.  HENT:     Head: Normocephalic and atraumatic.     Mouth/Throat:     Pharynx: No oropharyngeal exudate.  Eyes:     Pupils: Pupils are equal, round, and reactive to light.  Neck:     Thyroid: No thyromegaly.     Vascular: No JVD.     Trachea: No tracheal deviation.  Cardiovascular:     Rate and Rhythm: Normal rate and regular rhythm.     Heart sounds: Normal heart sounds. No murmur heard. No friction rub. No gallop.   Pulmonary:     Effort: Pulmonary effort is normal. No respiratory distress.     Breath sounds: No wheezing or rales.  Chest:     Chest wall: No tenderness.  Abdominal:     General: Bowel sounds are normal.     Palpations: Abdomen is soft.  Musculoskeletal:        General: Tenderness present. Normal range of motion.     Cervical back: Normal range of motion and neck supple.     Comments: Chronic L shoulder pain  Lymphadenopathy:     Cervical: No cervical adenopathy.  Skin:    General: Skin is warm and dry.  Neurological:     Mental Status: She is alert and oriented to person, place, and time.     Cranial Nerves: No cranial nerve deficit.  Psychiatric:        Thought Content: Thought content normal.        Judgment: Judgment normal.     Comments: Pt appears anxious        Assessment/Plan: 1. Mixed hyperlipidemia Tolerating Crestor, will recheck a lipid panel prior to next visit and adjust medication as necessary. - Lipid  panel  2. GAD (generalized anxiety disorder) Anxiety well controlled at this time. Continue Lexapro 10mg .  3. Sleep disturbance Sleep is improving, though sill showing signs of sleep apnea. Pt is unwilling to do an in lab sleep study due to covid and insurance will not cover a home test. Counseled on the negative effects of untreated OSA and poor sleep, and encouraged to reconsider going for an in lab sleep study.  4. Pain and  swelling of left shoulder Followed by Dr. Yves Dill for DDD, as well as neurology.  5. Encounter for screening mammogram for malignant neoplasm of breast - MM DIGITAL SCREENING BILATERAL; Future  6. Screening for colon cancer Pt is not willing to do a colonoscopy, but will be set up for cologuard   General Counseling: Barbarann verbalizes understanding of the findings of todays visit and agrees with plan of treatment. I have discussed any further diagnostic evaluation that may be needed or ordered today. We also reviewed her medications today. she has been encouraged to call the office with any questions or concerns that should arise related to todays visit.    Orders Placed This Encounter  Procedures  . MM DIGITAL SCREENING BILATERAL  . Lipid panel    No orders of the defined types were placed in this encounter.   Total time spent:30 Minutes Time spent includes review of chart, medications, test results, and follow up plan with the patient.      Dr Lyndon Code Internal medicine

## 2020-09-29 ENCOUNTER — Telehealth: Payer: Self-pay

## 2020-09-29 NOTE — Telephone Encounter (Signed)
Faxed cologuard 

## 2020-10-01 ENCOUNTER — Other Ambulatory Visit: Payer: Self-pay | Admitting: Physician Assistant

## 2020-10-01 DIAGNOSIS — Z1231 Encounter for screening mammogram for malignant neoplasm of breast: Secondary | ICD-10-CM

## 2020-10-21 LAB — EXTERNAL GENERIC LAB PROCEDURE: COLOGUARD: NEGATIVE

## 2020-10-21 LAB — COLOGUARD: COLOGUARD: NEGATIVE

## 2020-10-22 ENCOUNTER — Telehealth: Payer: Self-pay

## 2020-10-22 ENCOUNTER — Other Ambulatory Visit: Payer: Self-pay

## 2020-10-22 ENCOUNTER — Ambulatory Visit
Admission: RE | Admit: 2020-10-22 | Discharge: 2020-10-22 | Disposition: A | Discharge status: Home / Self Care | Payer: Medicaid Other | Source: Ambulatory Visit | Attending: Physician Assistant | Admitting: Physician Assistant

## 2020-10-22 DIAGNOSIS — Z1231 Encounter for screening mammogram for malignant neoplasm of breast: Secondary | ICD-10-CM | POA: Diagnosis present

## 2020-10-22 NOTE — Telephone Encounter (Signed)
LMOM that cologuard results are negative

## 2020-11-24 LAB — COLOGUARD

## 2020-11-27 NOTE — Progress Notes (Signed)
[facility]  [address] [place], [place] [address] Phone: [phone]  Fax: [phone]   Clinic Day:  12/01/2020  Referring physician: Lyndon Code, MD  Chief Complaint: Tammy Lam is a 65 y.o. female with hemochromatosis (H63D heterozygote) who is seen for 10 month assessment.   HPI:  The patient was last seen in the medical oncology clinic on 02/05/2020. At that time, she had chronic pain in her left shoulder.  Handgrip was decreased. Hematocrit was 45.6, hemoglobin 15.4, platelets 174,000, WBC 5,200. CMP was normal. Ferritin was 424 with an iron saturation of 34% and a TIBC of 347. AFP was 4.9. She was to follow up after her RUQ ultrasound.  RUQ abdominal ultrasound on 08/06/2020 revealed hepatic steatosis. There were similar-appearing vague areas of hypodensity within the left hepatic lobe that were c/w focal fatty sparing on MRI abdomen 02/21/2019.  The patient saw Lynn Ito, PA on 09/25/2020. She reported continued symptoms of sleep apnea. She did not feel comfortable doing a sleep study due to COVID-19. Her insurance did not cover an at home sleep study. She was encouraged to reconsider. The patient declined a colonoscopy but was set up for Cologuard, which came back negative on 10/14/2020.  Bilateral screening mammogram on 10/22/2020 revealed no evidence of malignancy.  During the interim, she has been fine. She has left shoulder pain that radiates to her head, arm, and back. She has nausea when the pain is bad.  She has not seen GI.   Past Medical History:  Diagnosis Date  . Elevated ferritin   . Headache   . Hemochromatosis   . Hyperlipidemia   . Hyponatremia   . Medical history non-contributory   . Shoulder injury     Past Surgical History:  Procedure Laterality Date  . ANKLE SURGERY Right     Family History  Problem Relation Age of Onset  . Liver disease Other   . Stroke Mother   . Breast cancer Neg Hx     Social  History:  reports that she has never smoked. She has never used smokeless tobacco. She reports that she does not drink alcohol and does not use drugs. She is originally from [place] came to the [place] 25 years ago as a refugee.Her name is pronounced "zip boy".[place]Her husband is also from [place]. 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 The patient is accompanied by interpreter, Inetta Fermo, today.  Allergies:  Allergies  Allergen Reactions  . Diphenhydramine Other (See Comments)  . Benadryl [Diphenhydramine Hcl] Itching  . Influenza Vaccines   . Penicillins Itching  . Tricor [Fenofibrate]     Current Medications: Current Outpatient Medications  Medication Sig Dispense Refill  . escitalopram (LEXAPRO) 10 MG tablet Take 1 tablet (10 mg total) by mouth at bedtime. For anxiety 90 tablet 1  . meloxicam (MOBIC) 15 MG tablet Take 15 mg by mouth daily.    . rosuvastatin (CRESTOR) 10 MG tablet Take 1 tablet (10 mg total) by mouth daily. 90 tablet 3  . traMADol (ULTRAM) 50 MG tablet Take one tab po qhs for shoulder pain 30 tablet 1   No current facility-administered medications for this visit.    Review of Systems  Constitutional: Negative for chills, diaphoresis, fever, malaise/fatigue and weight loss (up 10 lbs).  HENT: Negative.  Negative for congestion, ear discharge, ear pain, hearing loss, nosebleeds, sinus pain, sore throat and tinnitus.   Eyes: Negative.  Negative for  blurred vision.  Respiratory: Negative.  Negative for cough, hemoptysis, sputum production and shortness of breath.   Cardiovascular: Negative for chest pain, palpitations, claudication and leg swelling.  Gastrointestinal: Positive for nausea (when pain is bad). Negative for abdominal pain, constipation, diarrhea, heartburn and vomiting.  Genitourinary: Negative.  Negative for dysuria, frequency, hematuria and urgency.  Musculoskeletal: Positive for back  pain and joint pain (left shoulder, radiates to head, back, and arm). Negative for myalgias and neck pain.  Skin: Negative.  Negative for itching and rash.  Neurological: Positive for headaches. Negative for dizziness, tingling, tremors, sensory change and weakness.  Endo/Heme/Allergies: Negative.  Does not bruise/bleed easily.  Psychiatric/Behavioral: Negative.  Negative for depression and memory loss. The patient is not nervous/anxious and does not have insomnia.   All other systems reviewed and are negative.  Performance status (ECOG): 1  Vitals: Blood pressure (!) 147/76, pulse 72, temperature 98.2 F (36.8 C), temperature source Oral, resp. rate 20, weight 152 lb 10.7 oz (69.3 kg), SpO2 98 %.   Physical Exam Vitals and nursing note reviewed.  Constitutional:      General: She is not in acute distress.    Appearance: Normal appearance. She is well-developed. She is not diaphoretic.     Interventions: Face mask in place.  HENT:     Head: Normocephalic and atraumatic.     Mouth/Throat:     Mouth: Mucous membranes are moist.     Pharynx: Oropharynx is clear.  Eyes:     General: No scleral icterus.    Extraocular Movements: Extraocular movements intact.     Conjunctiva/sclera: Conjunctivae normal.     Pupils: Pupils are equal, round, and reactive to light.     Comments: Glasses.  Brown eyes.  Cardiovascular:     Rate and Rhythm: Normal rate and regular rhythm.     Pulses: Normal pulses.     Heart sounds: Normal heart sounds. No murmur heard. No gallop.   Pulmonary:     Effort: Pulmonary effort is normal. No respiratory distress.     Breath sounds: Normal breath sounds. No wheezing or rales.  Chest:     Chest wall: No tenderness.  Breasts:     Right: No axillary adenopathy or supraclavicular adenopathy.     Left: No axillary adenopathy or supraclavicular adenopathy.    Abdominal:     General: Bowel sounds are normal. There is no distension.     Palpations: Abdomen is  soft. There is no mass.     Tenderness: There is no abdominal tenderness. There is no guarding or rebound.  Musculoskeletal:        General: No swelling or tenderness. Normal range of motion.     Cervical back: Normal range of motion and neck supple.  Lymphadenopathy:     Head:     Right side of head: No preauricular, posterior auricular or occipital adenopathy.     Left side of head: No preauricular, posterior auricular or occipital adenopathy.     Cervical: No cervical adenopathy.     Upper Body:     Right upper body: No supraclavicular or axillary adenopathy.     Left upper body: No supraclavicular or axillary adenopathy.     Lower Body: No right inguinal adenopathy. No left inguinal adenopathy.  Skin:    General: Skin is warm and dry.     Coloration: Skin is not pale.     Findings: No erythema.  Neurological:     Mental Status: She is alert  and oriented to person, place, and time. Mental status is at baseline.  Psychiatric:        Mood and Affect: Mood normal.        Behavior: Behavior normal.        Thought Content: Thought content normal.        Judgment: Judgment normal.    Appointment on 12/01/2020  Component Date Value Ref Range Status  . WBC 12/01/2020 5.7  4.0 - 10.5 K/uL Final  . RBC 12/01/2020 4.90  3.87 - 5.11 MIL/uL Final  . Hemoglobin 12/01/2020 15.3* 12.0 - 15.0 g/dL Final  . HCT 16/05/9603 44.4  36.0 - 46.0 % Final  . MCV 12/01/2020 90.6  80.0 - 100.0 fL Final  . MCH 12/01/2020 31.2  26.0 - 34.0 pg Final  . MCHC 12/01/2020 34.5  30.0 - 36.0 g/dL Final  . RDW 54/04/8118 12.1  11.5 - 15.5 % Final  . Platelets 12/01/2020 159  150 - 400 K/uL Final  . nRBC 12/01/2020 0.0  0.0 - 0.2 % Final  . Neutrophils Relative % 12/01/2020 50  % Final  . Neutro Abs 12/01/2020 2.9  1.7 - 7.7 K/uL Final  . Lymphocytes Relative 12/01/2020 37  % Final  . Lymphs Abs 12/01/2020 2.1  0.7 - 4.0 K/uL Final  . Monocytes Relative 12/01/2020 6  % Final  . Monocytes Absolute 12/01/2020  0.3  0.1 - 1.0 K/uL Final  . Eosinophils Relative 12/01/2020 6  % Final  . Eosinophils Absolute 12/01/2020 0.3  0.0 - 0.5 K/uL Final  . Basophils Relative 12/01/2020 1  % Final  . Basophils Absolute 12/01/2020 0.0  0.0 - 0.1 K/uL Final  . Immature Granulocytes 12/01/2020 0  % Final  . Abs Immature Granulocytes 12/01/2020 0.02  0.00 - 0.07 K/uL Final   Performed at [facility], [address], [place], [place] [address]  . Sodium 12/01/2020 138  135 - 145 mmol/L Final  . Potassium 12/01/2020 3.7  3.5 - 5.1 mmol/L Final  . Chloride 12/01/2020 106  98 - 111 mmol/L Final  . CO2 12/01/2020 26  22 - 32 mmol/L Final  . Glucose, Bld 12/01/2020 124* 70 - 99 mg/dL Final   Glucose reference range applies only to samples taken after fasting for at least 8 hours.  . BUN 12/01/2020 11  8 - 23 mg/dL Final  . Creatinine, Ser 12/01/2020 0.73  0.44 - 1.00 mg/dL Final  . Calcium 95/62/1308 9.2  8.9 - 10.3 mg/dL Final  . Total Protein 12/01/2020 7.1  6.5 - 8.1 g/dL Final  . Albumin 65/78/4696 4.0  3.5 - 5.0 g/dL Final  . AST 29/52/8413 33  15 - 41 U/L Final  . ALT 12/01/2020 33  0 - 44 U/L Final  . Alkaline Phosphatase 12/01/2020 100  38 - 126 U/L Final  . Total Bilirubin 12/01/2020 0.5  0.3 - 1.2 mg/dL Final  . GFR, Estimated 12/01/2020 >60  >60 mL/min Final   Comment: (NOTE) Calculated using the CKD-EPI Creatinine Equation (2021)   . Anion gap 12/01/2020 6  5 - 15 Final   Performed at [facility], [address] [place], [place] [address]    Assessment:  Tammy Lam is a 65 y.o. female with hereditaryhemochromatosis(H63D heterozygote) diagnosed in 11/2015.   Ferritin has been followed: 485 on 11/18/2015, 332 on 12/02/2015, 387 on 01/14/2017, 340 on 01/10/2018, 598 on 05/15/2018, 454 on 01/09/2019, 442 on 08/06/2019, 424 on 02/05/2020.   Sed rate and  CRP were normal on 01/16/2019.  Iron saturationhas been followed: 38% on 11/18/2015, 29% on 01/14/2017, 45% on  01/10/2018, 37% on 05/15/2018, 23% on 01/09/2019.   [facility] on 05/26/2018 revealed probable hepatic fatty infiltration and focal fatty sparing.  RUQ ultrasound on 01/30/2019 revealed minimal sludge within gallbladder. There was questionable fatty infiltration of liver with potential 3.1 cm diameter mass lesion versus area of focal sparing.  MRI was recommended to exclude hepatic mass/maligancy. RUQ abdominal ultrasound on 02/04/2020 revealed hepatic steatosis with areas of sparing.  There were no new or progressive findings.  There was no evidence of gallbladder disease or ductal dilatation.  RUQ abdominal ultrasound on 08/06/2020 revealed hepatic steatosis. There were similar-appearing vague areas of hypodensity within the left hepatic lobe that were c/w focal fatty sparing on MRI abdomen 02/21/2019.  Liver MRI on 02/21/2019 revealed moderate to severe hepatic steatosis. There was focal fatty sparing in the central left lobe corresponding with the lesion seen on ultrasound. There was no evidence of hepatic neoplasm, no evidence of abnormal iron deposition seen within the liver or other abdominal parenchymal organs.    AFPhas ben followed:5.2 on 01/09/2019 and 4.9 on 02/05/2020.  Hepatitis B and C serologies were negative on 01/16/2019.  Patient received the COVID-19 vaccine months ago   Symptomatically, she feels fine. She has left shoulder pain that radiates to her head, arm, and back with associated nausea when the pain is bad.  Exam is unremarkable.  Plan: 1.    Labs today: CBC with diff, CMP, ferritin, iron studies, AFP. 2.  Hereditary hemochromatosis She has 1 copy of H63D mutation. Carrierstypically donot have manifestations of iron overload unless they carry a rare mutation (not tested). Iron saturation >= 40% for evidence of iron overload. Ferritin is an acute phase  reactant and can be elevated in inflammation. She continues to have no signs or symptoms of iron overload. Fatigue, joint pain, liver injury, diabetes, cardiac issues, increased skin pigmentation.   Doubt left shoulder discomfort due to iron deposition. Liver MRI on 02/21/2019 revealed no evidence of abnormal iron deposition   RUQ abdominal ultrasound on 08/06/2020 revealed hepatic steatosis with similar appearing vague areas of hypodensity in the left hepatic lobe.    Consider follow-up liver MRI to confirm no increased iron stores.   Next RUQ ultrasound in 01/2021.  AFP was 4.9 on 02/05/2020. Hepatitis B and C testing were negative. 3.   Hepatitis steatosis  MRI on 02/21/2019 revealed moderate to severe hepatic steatosis without excess iron deposition.  Discuss plan to follow-up with GI. 4.   RUQ ultrasound 02/04/2021. 5.   Referral to GI Maximino Greenland([place]). 6.   RTC after ultrasound for MD assess and review of ultrasound.  I discussed the assessment and treatment plan with the patient.  The patient was provided an opportunity to ask questions and all were answered.  The patient agreed with the plan and demonstrated an understanding of the instructions.  The patient was advised to call back if the symptoms worsen or if the condition fails to improve as anticipated.   I provided 16 minutes of face-to-face time during this this encounter and > 50% was spent counseling as documented under my assessment and plan.  An additional 5 minutes were spent reviewing her chart (Epic and Care Everywhere) including notes, labs, and imaging studies.    Rosey BathMelissa C Christ Fullenwider, MD, PhD    12/01/2020, 10:39 AM  I, Danella PentonEmily J Tufford, am acting as Neurosurgeonscribe for General MotorsMelissa C. Merlene Pullingorcoran,  MD, PhD.  I, Saraia Platner C. Merlene Pulling, MD, have reviewed the above documentation for accuracy and completeness, and I agree with the above.

## 2020-12-01 ENCOUNTER — Inpatient Hospital Stay: Payer: Medicare Other

## 2020-12-01 ENCOUNTER — Encounter: Payer: Self-pay | Admitting: Hematology and Oncology

## 2020-12-01 ENCOUNTER — Inpatient Hospital Stay: Payer: Medicare Other | Attending: Hematology and Oncology | Admitting: Hematology and Oncology

## 2020-12-01 ENCOUNTER — Other Ambulatory Visit: Payer: Self-pay

## 2020-12-01 DIAGNOSIS — K760 Fatty (change of) liver, not elsewhere classified: Secondary | ICD-10-CM | POA: Diagnosis not present

## 2020-12-01 LAB — CBC WITH DIFFERENTIAL/PLATELET
Abs Immature Granulocytes: 0.02 10*3/uL (ref 0.00–0.07)
Basophils Absolute: 0 10*3/uL (ref 0.0–0.1)
Basophils Relative: 1 %
Eosinophils Absolute: 0.3 10*3/uL (ref 0.0–0.5)
Eosinophils Relative: 6 %
HCT: 44.4 % (ref 36.0–46.0)
Hemoglobin: 15.3 g/dL — ABNORMAL HIGH (ref 12.0–15.0)
Immature Granulocytes: 0 %
Lymphocytes Relative: 37 %
Lymphs Abs: 2.1 10*3/uL (ref 0.7–4.0)
MCH: 31.2 pg (ref 26.0–34.0)
MCHC: 34.5 g/dL (ref 30.0–36.0)
MCV: 90.6 fL (ref 80.0–100.0)
Monocytes Absolute: 0.3 10*3/uL (ref 0.1–1.0)
Monocytes Relative: 6 %
Neutro Abs: 2.9 10*3/uL (ref 1.7–7.7)
Neutrophils Relative %: 50 %
Platelets: 159 10*3/uL (ref 150–400)
RBC: 4.9 MIL/uL (ref 3.87–5.11)
RDW: 12.1 % (ref 11.5–15.5)
WBC: 5.7 10*3/uL (ref 4.0–10.5)
nRBC: 0 % (ref 0.0–0.2)

## 2020-12-01 LAB — COMPREHENSIVE METABOLIC PANEL
ALT: 33 U/L (ref 0–44)
AST: 33 U/L (ref 15–41)
Albumin: 4 g/dL (ref 3.5–5.0)
Alkaline Phosphatase: 100 U/L (ref 38–126)
Anion gap: 6 (ref 5–15)
BUN: 11 mg/dL (ref 8–23)
CO2: 26 mmol/L (ref 22–32)
Calcium: 9.2 mg/dL (ref 8.9–10.3)
Chloride: 106 mmol/L (ref 98–111)
Creatinine, Ser: 0.73 mg/dL (ref 0.44–1.00)
GFR, Estimated: >60 mL/min (ref >60)
Glucose, Bld: 124 mg/dL — ABNORMAL HIGH (ref 70–99)
Potassium: 3.7 mmol/L (ref 3.5–5.1)
Sodium: 138 mmol/L (ref 135–145)
Total Bilirubin: 0.5 mg/dL (ref 0.3–1.2)
Total Protein: 7.1 g/dL (ref 6.5–8.1)

## 2020-12-01 LAB — IRON AND TIBC
Iron: 132 ug/dL (ref 28–170)
Saturation Ratios: 40 % — ABNORMAL HIGH (ref 10.4–31.8)
TIBC: 332 ug/dL (ref 250–450)
UIBC: 200 ug/dL

## 2020-12-01 LAB — FERRITIN: Ferritin: 499 ng/mL — ABNORMAL HIGH (ref 11–307)

## 2020-12-01 NOTE — Progress Notes (Signed)
Patient here for follow up. She reports on-going pain in left should she is being treated by pain management. She also expressed concern over her prognosis.

## 2020-12-02 ENCOUNTER — Encounter: Payer: Self-pay | Admitting: *Deleted

## 2020-12-02 ENCOUNTER — Telehealth: Payer: Self-pay

## 2020-12-02 LAB — AFP TUMOR MARKER: AFP, Serum, Tumor Marker: 4.5 ng/mL (ref 0.0–9.2)

## 2020-12-02 NOTE — Telephone Encounter (Signed)
Patient aware.

## 2020-12-02 NOTE — Telephone Encounter (Signed)
-----   Message from Carlynn Herald, RN sent at 12/01/2020  4:16 PM EDT ----- Regarding: FW: Please call patient I attempted to call patient to review results and appts. No answer.  ----- Message ----- From: Rayfield Citizen Sent: 12/01/2020   3:06 PM EDT To: Carlynn Herald, RN Subject: FW: Please call patient                        Pt will need to be called w/ results but I already made the appts for 4/19 @ 11:15. Can you give her the appt info when you call? Thanks! ----- Message ----- From: Rosey Bath, MD Sent: 12/01/2020   2:15 PM EDT To: Jens Som Subject: Please call patient                             Please schedule brief follow-up appointment and phlebotomy next week as iron saturations are high and ferritin high.  M  ----- Message ----- From: Leory Plowman, [facility] [place] Sent: 12/01/2020   9:19 AM EDT To: Rosey Bath, MD

## 2020-12-04 NOTE — Progress Notes (Signed)
[facility]  [address], [address] [place], [place] [address] Phone: [phone]  Fax: [phone]   Clinic Day:  12/09/2020  Referring physician: Lyndon Code, MD  Chief Complaint: Tammy Lam is a 65 y.o. female with hemochromatosis (H63D heterozygote) who is seen for review of interval labs and discussion regarding direction of therapy.  HPI:  The patient was last seen in the medical oncology clinic on 12/01/2020. At that time, she felt fine.  RUQ abdominal ultrasound on 08/06/2020 revealed hepatic steatosis with vague areas of hypodensity in the left hepatic lobe.  Prior liver MRI on 02/21/2019 revealed no evidence of abnormal iron deposition.  Hematocrit was 44.4, hemoglobin 15.3, platelets 159,000, WBC 5,700. Ferritin was 499 with an iron saturation of 40% (increased) and a TIBC of 332.  AFP was 4.5. She was referred to Dr Maximino Greenland in GI.  RUQ ultrasound is scheduled for 02/04/2021.  During the interim, she has been fine. She denies any new symptoms or complaints.   Past Medical History:  Diagnosis Date  . Elevated ferritin   . Headache   . Hemochromatosis   . Hyperlipidemia   . Hyponatremia   . Medical history non-contributory   . Shoulder injury     Past Surgical History:  Procedure Laterality Date  . ANKLE SURGERY Right     Family History  Problem Relation Age of Onset  . Liver disease Other   . Stroke Mother   . Breast cancer Neg Hx     Social History:  reports that she has never smoked. She has never used smokeless tobacco. She reports that she does not drink alcohol and does not use drugs. She is originally from [place] came to the [place] 25 years ago as a refugee.Her name is pronounced "zip boy".[place].Her husband, Long, is also from [place]. 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 The patient is accompanied by Long and virtual interpreter, Marcial Pacas [phone],  today.  Allergies:  Allergies  Allergen Reactions  . Diphenhydramine Other (See Comments)  . Benadryl [Diphenhydramine Hcl] Itching  . Influenza Vaccines   . Penicillins Itching  . Tricor [Fenofibrate]     Current Medications: Current Outpatient Medications  Medication Sig Dispense Refill  . escitalopram (LEXAPRO) 10 MG tablet Take 1 tablet (10 mg total) by mouth at bedtime. For anxiety 90 tablet 1  . rosuvastatin (CRESTOR) 10 MG tablet Take 1 tablet (10 mg total) by mouth daily. 90 tablet 3  . meloxicam (MOBIC) 15 MG tablet Take 15 mg by mouth daily. (Patient not taking: Reported on 12/09/2020)    . nortriptyline (PAMELOR) 10 MG capsule TAKE THREE CAPSULES ( 30 MG) NIGHTLY (Patient not taking: Reported on 12/09/2020)    . tiZANidine (ZANAFLEX) 4 MG tablet 1/2 po bid prn (Patient not taking: Reported on 12/09/2020)    . traMADol (ULTRAM) 50 MG tablet Take one tab po qhs for shoulder pain (Patient not taking: Reported on 12/09/2020) 30 tablet 1   No current facility-administered medications for this visit.    Review of Systems  Constitutional: Positive for weight loss (2 lbs). Negative for chills, diaphoresis, fever and malaise/fatigue.  HENT: Negative.  Negative for congestion, ear discharge, ear pain, hearing loss, nosebleeds, sinus pain, sore throat and tinnitus.   Eyes: Negative.  Negative for blurred vision.  Respiratory: Negative.  Negative for cough, hemoptysis, sputum production and shortness of breath.   Cardiovascular: Negative for chest pain, palpitations, claudication and leg swelling.  Gastrointestinal: Positive for nausea (when pain is bad). Negative for abdominal pain, constipation, diarrhea, heartburn and vomiting.  Genitourinary: Negative.  Negative for dysuria, frequency, hematuria and urgency.  Musculoskeletal: Positive for back pain and joint pain (left shoulder, radiates to head, back, and arm). Negative for myalgias and neck pain.  Skin: Negative.  Negative for  itching and rash.  Neurological: Positive for headaches. Negative for dizziness, tingling, tremors, sensory change and weakness.  Endo/Heme/Allergies: Negative.  Does not bruise/bleed easily.  Psychiatric/Behavioral: Negative.  Negative for depression and memory loss. The patient is not nervous/anxious and does not have insomnia.   All other systems reviewed and are negative.  Performance status (ECOG): 1  Vitals: Blood pressure 139/78, pulse 70, temperature 97.7 F (36.5 C), resp. rate 20, weight 150 lb 14.5 oz (68.5 kg), SpO2 98 %.   Physical Exam Vitals and nursing note reviewed.  Constitutional:      General: She is not in acute distress.    Appearance: Normal appearance. She is well-developed.     Interventions: Face mask in place.  HENT:     Head: Normocephalic and atraumatic.  Eyes:     General: No scleral icterus.    Conjunctiva/sclera: Conjunctivae normal.     Comments: Glasses.  Brown eyes.  Neurological:     Mental Status: She is alert and oriented to person, place, and time. Mental status is at baseline.  Psychiatric:        Mood and Affect: Mood normal.        Behavior: Behavior normal.        Thought Content: Thought content normal.        Judgment: Judgment normal.    No visits with results within 3 Day(s) from this visit.  Latest known visit with results is:  Appointment on 12/01/2020  Component Date Value Ref Range Status  . WBC 12/01/2020 5.7  4.0 - 10.5 K/uL Final  . RBC 12/01/2020 4.90  3.87 - 5.11 MIL/uL Final  . Hemoglobin 12/01/2020 15.3* 12.0 - 15.0 g/dL Final  . HCT 72/53/6644 44.4  36.0 - 46.0 % Final  . MCV 12/01/2020 90.6  80.0 - 100.0 fL Final  . MCH 12/01/2020 31.2  26.0 - 34.0 pg Final  . MCHC 12/01/2020 34.5  30.0 - 36.0 g/dL Final  . RDW 03/47/4259 12.1  11.5 - 15.5 % Final  . Platelets 12/01/2020 159  150 - 400 K/uL Final  . nRBC 12/01/2020 0.0  0.0 - 0.2 % Final  . Neutrophils Relative % 12/01/2020 50  % Final  . Neutro Abs 12/01/2020  2.9  1.7 - 7.7 K/uL Final  . Lymphocytes Relative 12/01/2020 37  % Final  . Lymphs Abs 12/01/2020 2.1  0.7 - 4.0 K/uL Final  . Monocytes Relative 12/01/2020 6  % Final  . Monocytes Absolute 12/01/2020 0.3  0.1 - 1.0 K/uL Final  . Eosinophils Relative 12/01/2020 6  % Final  . Eosinophils Absolute 12/01/2020 0.3  0.0 - 0.5 K/uL Final  . Basophils Relative 12/01/2020 1  % Final  . Basophils Absolute 12/01/2020 0.0  0.0 - 0.1 K/uL Final  . Immature Granulocytes 12/01/2020 0  % Final  . Abs Immature Granulocytes 12/01/2020 0.02  0.00 - 0.07 K/uL Final   Performed at [facility], [address], [place], [place] [address]  . Sodium 12/01/2020 138  135 - 145 mmol/L Final  . Potassium 12/01/2020 3.7  3.5 - 5.1 mmol/L Final  . Chloride 12/01/2020 106  98 -  111 mmol/L Final  . CO2 12/01/2020 26  22 - 32 mmol/L Final  . Glucose, Bld 12/01/2020 124* 70 - 99 mg/dL Final   Glucose reference range applies only to samples taken after fasting for at least 8 hours.  . BUN 12/01/2020 11  8 - 23 mg/dL Final  . Creatinine, Ser 12/01/2020 0.73  0.44 - 1.00 mg/dL Final  . Calcium 00/93/8182 9.2  8.9 - 10.3 mg/dL Final  . Total Protein 12/01/2020 7.1  6.5 - 8.1 g/dL Final  . Albumin 99/37/1696 4.0  3.5 - 5.0 g/dL Final  . AST 78/93/8101 33  15 - 41 U/L Final  . ALT 12/01/2020 33  0 - 44 U/L Final  . Alkaline Phosphatase 12/01/2020 100  38 - 126 U/L Final  . Total Bilirubin 12/01/2020 0.5  0.3 - 1.2 mg/dL Final  . GFR, Estimated 12/01/2020 >60  >60 mL/min Final   Comment: (NOTE) Calculated using the CKD-EPI Creatinine Equation (2021)   . Anion gap 12/01/2020 6  5 - 15 Final   Performed at [facility], [address] [place], [place] [address]  . Ferritin 12/01/2020 499* 11 - 307 ng/mL Final   Performed at [facility], [address] [place], [place] [address]  . Iron 12/01/2020 132  28 - 170 ug/dL Final  . TIBC 78/24/2353 332  250 - 450 ug/dL Final  . Saturation  Ratios 12/01/2020 40* 10.4 - 31.8 % Final  . UIBC 12/01/2020 200  ug/dL Final   Performed at [facility], [address], [place], [place] [address]  . AFP, Serum, Tumor Marker 12/01/2020 4.5  0.0 - 9.2 ng/mL Final   Comment: (NOTE) [employer] Electrochemiluminescence Immunoassay (ECLIA) Values obtained with different assay methods or kits cannot be used interchangeably.  Results cannot be interpreted as absolute evidence of the presence or absence of malignant disease. This test is not interpretable in pregnant females. Performed At: [facility] [address] [place], [place] [id number] Jolene Schimke MD PP:[phone]     Assessment:  Tammy Lam is a 65 y.o. female with hereditaryhemochromatosis(H63D heterozygote) diagnosed in 11/2015.   Ferritin has been followed: 485 on 11/18/2015, 332 on 12/02/2015, 387 on 01/14/2017, 340 on 01/10/2018, 598 on 05/15/2018, 454 on 01/09/2019, 442 on 08/06/2019, 424 on 02/05/2020, 499 on 12/01/2020, 516 on 12/09/2020.   Sed rate and CRP were normal on 01/16/2019.  Iron saturationhas been followed: 38% on 11/18/2015, 29% on 01/14/2017, 45% on 01/10/2018, 37% on 05/15/2018, 23% on 01/09/2019, 34% on 02/05/2020, and 40% on 12/01/2020.   [facility]dominal ultrasoundat [facility] on 05/26/2018 revealed probable hepatic fatty infiltration and focal fatty sparing.  RUQ ultrasound on 01/30/2019 revealed minimal sludge within gallbladder. There was questionable fatty infiltration of liver with potential 3.1 cm diameter mass lesion versus area of focal sparing.  MRI was recommended to exclude hepatic mass/maligancy. RUQ abdominal ultrasound on 02/04/2020 revealed hepatic steatosis with areas of sparing.  There were no new or progressive findings.  There was no evidence of gallbladder disease or ductal dilatation.  RUQ abdominal ultrasound on 08/06/2020 revealed hepatic steatosis. There were similar-appearing vague areas of hypodensity  within the left hepatic lobe that were c/w focal fatty sparing on MRI abdomen 02/21/2019.  Liver MRI on 02/21/2019 revealed moderate to severe hepatic steatosis. There was focal fatty sparing in the central left lobe corresponding with the lesion seen on ultrasound. There was no evidence of hepatic neoplasm, no evidence of abnormal iron deposition seen within the  liver or other abdominal parenchymal organs.    AFPhas ben followed:5.2 on 01/09/2019, 4.9 on 02/05/2020, and 4.5 on 12/01/2020.  Hepatitis B and C serologies were negative on 01/16/2019.  Patient received the COVID-19 vaccine months ago   Symptomatically, she denies any new complaints.  She continues to have left shoulder discomfort.  Plan: 1.    Labs today: ferritin, iron studies, sed rate, CRP. 2.  Hereditary hemochromatosis She has 1 copy of H63D mutation Carrierstypically donot have manifestations of iron overload unless they carry a rare mutation (not tested). Iron saturation >= 40% for evidence of iron overload. Ferritin is an acute phase reactant and can be elevated in inflammation. She has no signs or symptoms of iron overload. Fatigue, joint pain, liver injury, diabetes, cardiac issues, increased skin pigmentation.   Doubt left shoulder discomfort due to iron deposition. Liver MRI on 02/21/2019 revealed no evidence of abnormal iron deposition   RUQ abdominal ultrasound on 08/06/2020 revealed hepatic steatosis with similar appearing vague areas of hypodensity in the left hepatic lobe.    AFP was 4.5 on 12/01/2020.  Discuss increase iron saturation (40%) raises the possibility of a rare mutation (in conjunction with 1 copy of H63D) resulting in iron deposition.   Discuss awaiting today's follow-up labs.  Brief discussion of management of HH with phlebotomy to maintain a ferritin of  50-100.  Multiple questions asked and answered. 3.   Hepatitis steatosis  MRI on 02/21/2019 revealed moderate to severe hepatic steatosis without excess iron deposition.  GI consult pending. 4.   No phlebotomy today. 5.   RTC on 12/12/2020 (telephone with interpreter) for MD assessment and decision regarding phlebotomy.  I discussed the assessment and treatment plan with the patient.  The patient was provided an opportunity to ask questions and all were answered.  The patient agreed with the plan and demonstrated an understanding of the instructions.  The patient was advised to call back if the symptoms worsen or if the condition fails to improve as anticipated.   I provided 28 minutes of face-to-face time during this this encounter and > 50% was spent counseling as documented under my assessment and plan.   Rosey BathMelissa C Yonna Alwin, MD, PhD    12/09/2020, 12:34 PM  I, Danella PentonEmily J Tufford, am acting as Neurosurgeonscribe for General MotorsMelissa C. Merlene Pullingorcoran, MD, PhD.  I, Gracyn Allor C. Merlene Pullingorcoran, MD, have reviewed the above documentation for accuracy and completeness, and I agree with the above.

## 2020-12-09 ENCOUNTER — Inpatient Hospital Stay: Payer: Medicare Other

## 2020-12-09 ENCOUNTER — Inpatient Hospital Stay (HOSPITAL_BASED_OUTPATIENT_CLINIC_OR_DEPARTMENT_OTHER): Payer: Medicare Other | Admitting: Hematology and Oncology

## 2020-12-09 ENCOUNTER — Other Ambulatory Visit: Payer: Self-pay

## 2020-12-09 ENCOUNTER — Encounter: Payer: Self-pay | Admitting: Hematology and Oncology

## 2020-12-09 DIAGNOSIS — K760 Fatty (change of) liver, not elsewhere classified: Secondary | ICD-10-CM | POA: Diagnosis not present

## 2020-12-09 LAB — SEDIMENTATION RATE: Sed Rate: 4 mm/hr (ref 0–30)

## 2020-12-09 LAB — IRON AND TIBC
Iron: 115 ug/dL (ref 28–170)
Saturation Ratios: 31 % (ref 10.4–31.8)
TIBC: 368 ug/dL (ref 250–450)
UIBC: 253 ug/dL

## 2020-12-09 LAB — FERRITIN: Ferritin: 516 ng/mL — ABNORMAL HIGH (ref 11–307)

## 2020-12-10 LAB — C-REACTIVE PROTEIN: CRP: 1.6 mg/dL — ABNORMAL HIGH (ref <1.0)

## 2020-12-11 NOTE — Progress Notes (Incomplete)
[facility]  [address] [place], [place] [address] Phone: [phone]  Fax: [phone]   Telemedicine Office Visit:  12/11/2020  Referring physician: Lyndon Code, MD  I connected with Tammy Lam on 12/12/2020 at 8:54 AM by videoconferencing and verified that I was speaking with the correct person using 2 identifiers.  The patient was at *** home.  I discussed the limitations, risk, security and privacy concerns of performing an evaluation and management service by videoconferencing and the availability of in person appointments.  I also discussed with the patient that there may be a patient responsible charge related to this service.  The patient expressed understanding and agreed to proceed.   Chief Complaint: Tammy Lam is a 65 y.o. female with hemochromatosis (H63D heterozygote) who is seen for review of interval labs and discussion regarding direction of therapy.  HPI:  The patient was last seen in the medical oncology clinic on 12/09/2020. At that time, ***. Ferritin was 516 with an iron saturation of 31% and a TIBC of 368. CRP was 1.6. Sed rate was 4.  During the interim, ***  Past Medical History:  Diagnosis Date  . Elevated ferritin   . Headache   . Hemochromatosis   . Hyperlipidemia   . Hyponatremia   . Medical history non-contributory   . Shoulder injury     Past Surgical History:  Procedure Laterality Date  . ANKLE SURGERY Right     Family History  Problem Relation Age of Onset  . Liver disease Other   . Stroke Mother   . Breast cancer Neg Hx     Social History:  reports that she has never smoked. She has never used smokeless tobacco. She reports that she does not drink alcohol and does not use drugs. She is originally from [place] came to the [place] 25 years ago as a refugee.Her name is pronounced "zip boy".[place].Her husband, Tammy Lam, is also from [place]. 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 The patient is accompanied by Tammy Lam and virtual interpreter, ***, today.  Participants in the patient's visit and their role in the encounter included the patient, ***, and Delano Metz, RN or Bronwen Betters, CMA, today.  The intake visit was provided by *** Delano Metz, RN or Bronwen Betters, CMA.  Allergies:  Allergies  Allergen Reactions  . Diphenhydramine Other (See Comments)  . Benadryl [Diphenhydramine Hcl] Itching  . Influenza Vaccines   . Penicillins Itching  . Tricor [Fenofibrate]     Current Medications: Current Outpatient Medications  Medication Sig Dispense Refill  . escitalopram (LEXAPRO) 10 MG tablet Take 1 tablet (10 mg total) by mouth at bedtime. For anxiety 90 tablet 1  . meloxicam (MOBIC) 15 MG tablet Take 15 mg by mouth daily. (Patient not taking: Reported on 12/09/2020)    . nortriptyline (PAMELOR) 10 MG capsule TAKE THREE CAPSULES ( 30 MG) NIGHTLY (Patient not taking: Reported on 12/09/2020)    . rosuvastatin (CRESTOR) 10 MG tablet Take 1 tablet (10 mg total) by mouth daily. 90 tablet 3  . tiZANidine (ZANAFLEX) 4 MG tablet 1/2 po bid prn (Patient not taking: Reported on 12/09/2020)    . traMADol (ULTRAM) 50 MG tablet Take one tab po qhs for shoulder pain (Patient not taking: Reported on 12/09/2020) 30 tablet 1   No current facility-administered medications for this visit.    Review of Systems  Constitutional: Positive for weight loss (2 lbs). Negative for  chills, diaphoresis, fever and malaise/fatigue.  HENT: Negative.  Negative for congestion, ear discharge, ear pain, hearing loss, nosebleeds, sinus pain, sore throat and tinnitus.   Eyes: Negative.  Negative for blurred vision.  Respiratory: Negative.  Negative for cough, hemoptysis, sputum production and shortness of breath.   Cardiovascular: Negative for chest pain, palpitations, claudication and leg swelling.  Gastrointestinal: Positive for nausea (when  pain is bad). Negative for abdominal pain, constipation, diarrhea, heartburn and vomiting.  Genitourinary: Negative.  Negative for dysuria, frequency, hematuria and urgency.  Musculoskeletal: Positive for back pain and joint pain (left shoulder, radiates to head, back, and arm). Negative for myalgias and neck pain.  Skin: Negative.  Negative for itching and rash.  Neurological: Positive for headaches. Negative for dizziness, tingling, tremors, sensory change and weakness.  Endo/Heme/Allergies: Negative.  Does not bruise/bleed easily.  Psychiatric/Behavioral: Negative.  Negative for depression and memory loss. The patient is not nervous/anxious and does not have insomnia.   All other systems reviewed and are negative.  Performance status (ECOG): 1***  Vitals: There were no vitals taken for this visit.   Physical Exam Vitals and nursing note reviewed.  Constitutional:      General: She is not in acute distress.    Appearance: Normal appearance. She is well-developed. She is not diaphoretic.     Interventions: Face mask in place.  HENT:     Head: Normocephalic and atraumatic.     Mouth/Throat:     Mouth: Mucous membranes are moist.     Pharynx: Oropharynx is clear.  Eyes:     General: No scleral icterus.    Extraocular Movements: Extraocular movements intact.     Conjunctiva/sclera: Conjunctivae normal.     Pupils: Pupils are equal, round, and reactive to light.     Comments: Glasses.  Brown eyes.  Cardiovascular:     Rate and Rhythm: Normal rate and regular rhythm.     Pulses: Normal pulses.     Heart sounds: Normal heart sounds. No murmur heard. No gallop.   Pulmonary:     Effort: Pulmonary effort is normal. No respiratory distress.     Breath sounds: Normal breath sounds. No wheezing or rales.  Chest:     Chest wall: No tenderness.  Breasts:     Right: No axillary adenopathy or supraclavicular adenopathy.     Left: No axillary adenopathy or supraclavicular adenopathy.     Abdominal:     General: Bowel sounds are normal. There is no distension.     Palpations: Abdomen is soft. There is no mass.     Tenderness: There is no abdominal tenderness. There is no guarding or rebound.  Musculoskeletal:        General: No swelling or tenderness. Normal range of motion.     Cervical back: Normal range of motion and neck supple.  Lymphadenopathy:     Head:     Right side of head: No preauricular, posterior auricular or occipital adenopathy.     Left side of head: No preauricular, posterior auricular or occipital adenopathy.     Cervical: No cervical adenopathy.     Upper Body:     Right upper body: No supraclavicular or axillary adenopathy.     Left upper body: No supraclavicular or axillary adenopathy.     Lower Body: No right inguinal adenopathy. No left inguinal adenopathy.  Skin:    General: Skin is warm and dry.     Coloration: Skin is not pale.     Findings:  No erythema.  Neurological:     Mental Status: She is alert and oriented to person, place, and time. Mental status is at baseline.  Psychiatric:        Mood and Affect: Mood normal.        Behavior: Behavior normal.        Thought Content: Thought content normal.        Judgment: Judgment normal.    No visits with results within 3 Day(s) from this visit.  Latest known visit with results is:  Appointment on 12/09/2020  Component Date Value Ref Range Status  . CRP 12/09/2020 1.6* <1.0 mg/dL Final   Performed at [facility], [address], [place], [place] [address]  . Sed Rate 12/09/2020 4  0 - 30 mm/hr Final   Performed at [facility], [address], [place], [place] [address]  . Iron 12/09/2020 115  28 - 170 ug/dL Final  . TIBC 21/30/8657 368  250 - 450 ug/dL Final  . Saturation Ratios 12/09/2020 31  10.4 - 31.8 % Final  . UIBC 12/09/2020 253  ug/dL Final   Performed at [facility], [address] [place], [place] [address]  . Ferritin 12/09/2020 516* 11  - 307 ng/mL Final   Performed at [facility], [address] [place] [place], [place] [address]    Assessment:  Tammy Lam is a 65 y.o. female with hereditaryhemochromatosis(H63D heterozygote) diagnosed in 11/2015.   Ferritin has been followed: 485 on 11/18/2015, 332 on 12/02/2015, 387 on 01/14/2017, 340 on 01/10/2018, 598 on 05/15/2018, 454 on 01/09/2019, 442 on 08/06/2019, 424 on 02/05/2020.   Sed rate and CRP were normal on 01/16/2019.  Iron saturationhas been followed: 38% on 11/18/2015, 29% on 01/14/2017, 45% on 01/10/2018, 37% on 05/15/2018, 23% on 01/09/2019.   Abdominal ultrasoundat [facility] on 05/26/2018 revealed probable hepatic fatty infiltration and focal fatty sparing.  RUQ ultrasound on 01/30/2019 revealed minimal sludge within gallbladder. There was questionable fatty infiltration of liver with potential 3.1 cm diameter mass lesion versus area of focal sparing.  MRI was recommended to exclude hepatic mass/maligancy. RUQ abdominal ultrasound on 02/04/2020 revealed hepatic steatosis with areas of sparing.  There were no new or progressive findings.  There was no evidence of gallbladder disease or ductal dilatation.  RUQ abdominal ultrasound on 08/06/2020 revealed hepatic steatosis. There were similar-appearing vague areas of hypodensity within the left hepatic lobe that were c/w focal fatty sparing on MRI abdomen 02/21/2019.  Liver MRI on 02/21/2019 revealed moderate to severe hepatic steatosis. There was focal fatty sparing in the central left lobe corresponding with the lesion seen on ultrasound. There was no evidence of hepatic neoplasm, no evidence of abnormal iron deposition seen within the liver or other abdominal parenchymal organs.    AFPhas ben followed:5.2 on 01/09/2019 and 4.9 on 02/05/2020.  Hepatitis B and C serologies were negative on 01/16/2019.  Patient received the COVID-19 vaccine months ago   Symptomatically, ***  Plan: 1.    Review  labs   2.  Hereditary hemochromatosis She has 1 copy of H63D mutation Carrierstypically donot have manifestations of iron overload unless they carry a rare mutation (not tested). Iron saturation >= 40% for evidence of iron overload. Ferritin is an acute phase reactant and can be elevated in inflammation. She continues to have no signs or symptoms of iron overload. Fatigue, joint pain, liver injury, diabetes, cardiac issues, increased skin pigmentation.   Doubt shoulder discomfort due to iron deposition.  Liver MRI on 02/21/2019 revealed no evidence of abnormal iron deposition   RUQ abdominal ultrasound on 08/06/2020 revealed hepatic steatosis with similar appearing vague areas of hypodensity in the left hepatic lobe.      Continue right upper quadrant ultrasound and AFP surveillance every 6 months.   AFP is 4.9 on 02/05/2020. Hepatitis B and C testing were negative. 3.   Hepatitis steatosis  MRI on 02/21/2019 revealed moderate to severe hepatic steatosis without excess iron deposition.  Discuss consideration of GI referral. 4.   Decreased hand grip  Patient noted to have decreased strength in hand.  Patient to schedule follow-up with neurologist.  No phlebotomy today. Labs today. RTC on 04/22 (telephone with interpreter) for MD assess and decision regarding phlebotomy.  I discussed the assessment and treatment plan with the patient.  The patient was provided an opportunity to ask questions and all were answered.  The patient agreed with the plan and demonstrated an understanding of the instructions.  The patient was advised to call back if the symptoms worsen or if the condition fails to improve as anticipated.   I provided *** minutes (8:53 AM - x:xx) of face-to-face video visit time during this this encounter and > 50% was spent counseling  as documented under my assessment and plan.  [facility] ***.  Rosey BathMelissa C Corcoran, MD, PhD    12/11/2020, 8:52 AM  I, Danella PentonEmily J Tufford, am acting as Neurosurgeonscribe for General MotorsMelissa C. Merlene Pullingorcoran, MD, PhD.  I, Melissa C. Merlene Pullingorcoran, MD, have reviewed the above documentation for accuracy and completeness, and I agree with the above.

## 2020-12-12 ENCOUNTER — Other Ambulatory Visit: Payer: Self-pay

## 2020-12-12 ENCOUNTER — Inpatient Hospital Stay: Payer: Medicare Other | Admitting: Oncology

## 2020-12-16 ENCOUNTER — Inpatient Hospital Stay (HOSPITAL_BASED_OUTPATIENT_CLINIC_OR_DEPARTMENT_OTHER): Payer: Medicare Other | Admitting: Nurse Practitioner

## 2020-12-16 ENCOUNTER — Other Ambulatory Visit: Payer: Self-pay

## 2020-12-16 NOTE — Progress Notes (Signed)
[facility]  [address], [address] [place], [place] [address] Phone: [phone]  Fax: [phone] Virtual Visit Progress Note  I connected with Tammy Lam on 12/16/20 at 10:30 AM EDT by video enabled telemedicine visit and verified that I am speaking with the correct person using two identifiers.   I discussed the limitations, risks, security and privacy concerns of performing an evaluation and management service by telemedicine and the availability of in-person appointments. I also discussed with the patient that there may be a patient responsible charge related to this service. The patient expressed understanding and agreed to proceed.   Other persons participating in the visit and their role in the encounter: patient's husband, Long  Patient's location: home Provider's location: home  Chief Complaint: hemochromatosis  Referring physician: Lyndon Code, MD  Hematology History:  Afsheen Antony is a 65 y.o. female with hereditary hemochromatosis (H63D) heterozygote, diagnosed in 11/2015.   Ferritin has been followed: 485 on 11/18/2015, 332 on 12/02/2015, 387 on 01/14/2017, 340 on 01/10/2018, 598 on 05/15/2018, 454 on 01/09/2019, 442 on 08/06/2019, 424 on 02/05/2020.   Sed rate and CRP were normal on 01/16/2019.  Iron saturationhas been followed: 38% on 11/18/2015, 29% on 01/14/2017, 45% on 01/10/2018, 37% on 05/15/2018, 23% on 01/09/2019.   Abdominal ultrasoundat [facility] on 05/26/2018 revealed probable hepatic fatty infiltration and focal fatty sparing.  RUQ ultrasound on 01/30/2019 revealed minimal sludge within gallbladder. There was questionable fatty infiltration of liver with potential 3.1 cm diameter mass lesion versus area of focal sparing.  MRI was recommended to exclude hepatic mass/maligancy. RUQ abdominal ultrasound on 02/04/2020 revealed hepatic steatosis with areas of sparing.  There were no new or progressive findings.  There was no evidence  of gallbladder disease or ductal dilatation.  RUQ abdominal ultrasound on 08/06/2020 revealed hepatic steatosis. There were similar-appearing vague areas of hypodensity within the left hepatic lobe that were c/w focal fatty sparing on MRI abdomen 02/21/2019.  Liver MRI on 02/21/2019 revealed moderate to severe hepatic steatosis. There was focal fatty sparing in the central left lobe corresponding with the lesion seen on ultrasound. There was no evidence of hepatic neoplasm, no evidence of abnormal iron deposition seen within the liver or other abdominal parenchymal organs.    AFPhas ben followed:5.2 on 01/09/2019 and 4.9 on 02/05/2020.  Hepatitis B and C serologies were negative on 01/16/2019.  Patient received the COVID-19 vaccine months ago    Tammy Lam is a 65 y.o. female with hemochromatosis (H63D heterozygote) who is seen for review of interval labs and discussion regarding direction of therapy.  Interval History: Patient returns to clinic for labs and discussion of treatment for hereditary hemochromatosis. She is accompanied by her husband, Long. Due to a language barrier, an interpreter participates in all patient interactions. She says she feels poorly. Has fatigue and joint stiffness and pain. Wakes up tired and goes to bed tired. Fatigue doesn't improve with rest. Joint pain occurs in multiple joints. Feels depressed.    Past Medical History:  Diagnosis Date  . Elevated ferritin   . Headache   . Hemochromatosis   . Hyperlipidemia   . Hyponatremia   . Medical history non-contributory   . Shoulder injury     Past Surgical History:  Procedure Laterality Date  . ANKLE SURGERY Right     Family History  Problem Relation Age of Onset  . Liver disease Other   . Stroke Mother   . Breast cancer Neg Hx  Social History:  reports that she has never smoked. She has never used smokeless tobacco. She reports that she does not drink alcohol and does not use drugs. She is originally  from [place] came to the [place] 25 years ago as a refugee.Her name is pronounced "zip boy".[place].Her husband, Long, is also from [place]. 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    Allergies:  Allergies  Allergen Reactions  . Diphenhydramine Other (See Comments)  . Benadryl [Diphenhydramine Hcl] Itching  . Influenza Vaccines   . Penicillins Itching  . Tricor [Fenofibrate]     Current Medications: Current Outpatient Medications  Medication Sig Dispense Refill  . escitalopram (LEXAPRO) 10 MG tablet Take 1 tablet (10 mg total) by mouth at bedtime. For anxiety 90 tablet 1  . meloxicam (MOBIC) 15 MG tablet Take 15 mg by mouth daily. (Patient not taking: Reported on 12/09/2020)    . nortriptyline (PAMELOR) 10 MG capsule TAKE THREE CAPSULES ( 30 MG) NIGHTLY (Patient not taking: Reported on 12/09/2020)    . rosuvastatin (CRESTOR) 10 MG tablet Take 1 tablet (10 mg total) by mouth daily. 90 tablet 3  . tiZANidine (ZANAFLEX) 4 MG tablet 1/2 po bid prn (Patient not taking: Reported on 12/09/2020)    . traMADol (ULTRAM) 50 MG tablet Take one tab po qhs for shoulder pain (Patient not taking: Reported on 12/09/2020) 30 tablet 1   No current facility-administered medications for this visit.    Review of Systems  Constitutional: Positive for malaise/fatigue. Negative for chills, fever and weight loss.  HENT: Negative for hearing loss, nosebleeds, sore throat and tinnitus.   Eyes: Negative for blurred vision and double vision.  Respiratory: Negative for cough, hemoptysis, shortness of breath and wheezing.   Cardiovascular: Negative for chest pain, palpitations and leg swelling.  Gastrointestinal: Negative for abdominal pain, blood in stool, constipation, diarrhea, melena, nausea and vomiting.  Genitourinary: Negative for dysuria and urgency.  Musculoskeletal: Positive for joint pain. Negative for back pain, falls and myalgias.   Skin: Negative for itching and rash.  Neurological: Positive for headaches. Negative for dizziness, tingling, sensory change, loss of consciousness and weakness.  Endo/Heme/Allergies: Negative for environmental allergies. Does not bruise/bleed easily.  Psychiatric/Behavioral: Positive for depression. The patient is not nervous/anxious and does not have insomnia.    Performance status (ECOG): 1  Vitals: There were no vitals taken for this visit. Exam limited due to virtual visit  Physical Exam Constitutional:      Appearance: She is not ill-appearing.     Comments: Accompanied by husband  Pulmonary:     Effort: Pulmonary effort is normal.  Neurological:     General: No focal deficit present.     Mental Status: She is alert and oriented to person, place, and time.  Psychiatric:        Behavior: Behavior normal.    No visits with results within 3 Day(s) from this visit.  Latest known visit with results is:  Appointment on 12/09/2020  Component Date Value Ref Range Status  . CRP 12/09/2020 1.6* <1.0 mg/dL Final   Performed at [facility], [address], [place], [place] [address]  . Sed Rate 12/09/2020 4  0 - 30 mm/hr Final   Performed at [facility], [address], [place], [place] [address]  . Iron 12/09/2020 115  28 - 170 ug/dL Final  . TIBC 04/05/4817 368  250 - 450 ug/dL Final  . Saturation  Ratios 12/09/2020 31  10.4 - 31.8 % Final  . UIBC 12/09/2020 253  ug/dL Final   Performed at [facility], [address] [place] [place], [place] [address]  . Ferritin 12/09/2020 516* 11 - 307 ng/mL Final   Performed at [facility], [address] [place], [place] [address]    Assessment:  1. Hereditary Hemochromatosis- 1 copy of H63D mutation. Symptomatic of joint pain and fatigue. Hx of hepatic steatosis; no evidence of iron deposition. No diagnosed diabetes, cardiac issues. LFTs normal. Liver MRI 02/21/2019 revealed no evidence of abnormal iron  deposition. Liver u/s on 08/06/20 consistent with hepatic steatosis. AFP 4.5 on 12/01/20. Hepatitis B and C testing were negative. No hx of cirrhosis.   Ferritin 516 (elevated). Saturation Ratio 31%. Ferritin > 200 for women considered excessive however, ferritin is also an acute phase reactant and levels can fluctuate secondary to chronic inflammation, liver injury, or certain acute illnesses. C reactive protein was checked to evaluate for inflammation and was slightly elevated. Would consider iron saturation of > or = 40% also evidence of iron overload. Per uptodate, recommendation is to repeat MRI to reassess body iron stores if ferritin > 100 ng/ml or if clinically symptomatic. Patient is now symptomatic. Discussed repeating MRI vs trial phlebotomy based on her symptoms. Patient would like ot trial phlebotomy. If symptoms persist or odn't improve, recommend repeating liver MRI.  For surveillance for [facility] and cirrhosis plan to repeat RUQ u/s and AFP for surveillance every 6 months.   Proceed with phlebotomy based on patient's symptoms. Plan for 500 cc phlebotomy with fluid replacement, however, patient may not be able to tolerate. Will plan for at least 300 cc phlebotomy.   2. Hepatic steatosis- as seen on liver MRI. Follow up with PCP vs GI referral.    Plan:  Schedule patient for phlebotomy 4 weeks - lab (cbc, cmp, ferritin, iron studies), day later, NP for virtual appt  I discussed the assessment and treatment plan with the patient. The patient was provided an opportunity to ask questions and all were answered. The patient agreed with the plan and demonstrated an understanding of the instructions.   The patient was advised to call back or seek an in-person evaluation if the symptoms worsen or if the condition fails to improve as anticipated.   I spent 25 minutes face-to-face video visit time dedicated to the care of this patient on the date of this encounter to include pre-visit review of  hematology notes, labs, relevant imaging, face-to-face time with the patient, and post visit ordering of testing/documentation.    Consuello Masse, DNP, AGNP-C [facility]

## 2020-12-25 ENCOUNTER — Other Ambulatory Visit: Payer: Self-pay

## 2020-12-25 ENCOUNTER — Ambulatory Visit (INDEPENDENT_AMBULATORY_CARE_PROVIDER_SITE_OTHER): Payer: Medicare Other | Admitting: Physician Assistant

## 2020-12-25 ENCOUNTER — Encounter: Payer: Self-pay | Admitting: Physician Assistant

## 2020-12-25 DIAGNOSIS — M545 Low back pain, unspecified: Secondary | ICD-10-CM | POA: Diagnosis not present

## 2020-12-25 DIAGNOSIS — E782 Mixed hyperlipidemia: Secondary | ICD-10-CM | POA: Diagnosis not present

## 2020-12-25 DIAGNOSIS — G479 Sleep disorder, unspecified: Secondary | ICD-10-CM | POA: Diagnosis not present

## 2020-12-25 LAB — POCT URINALYSIS DIPSTICK
Bilirubin, UA: NEGATIVE
Blood, UA: NEGATIVE
Glucose, UA: NEGATIVE
Ketones, UA: NEGATIVE
Leukocytes, UA: NEGATIVE
Nitrite, UA: NEGATIVE
Protein, UA: NEGATIVE
Spec Grav, UA: 1.01 (ref 1.010–1.025)
Urobilinogen, UA: 0.2 E.U./dL
pH, UA: 5 (ref 5.0–8.0)

## 2020-12-25 NOTE — Progress Notes (Signed)
[facility] [address] [place], [place] [address]  Internal MEDICINE  Office Visit Note  Patient Name: Tammy Lam  [id number]  [id number]  Date of Service: 12/25/2020  Chief Complaint  Patient presents with  . Follow-up  . Hyperlipidemia    HPI Pt is here for routine follow up -Mammogram and cologaurd both completed are were negative -She still snores loudly and has poor sleep and would like to move forward with scheduling a home sleep study now. -Ferritin high, seeing heme/onc for this -Some low back pain, no burning with urinating, but has had an infection in past similar to this and would like to check her urine today. -BP Recheck 140/80 -Pt is going to have lipid panel rechecked after visit today  Current Medication: Outpatient Encounter Medications as of 12/25/2020  Medication Sig  . escitalopram (LEXAPRO) 10 MG tablet Take 1 tablet (10 mg total) by mouth at bedtime. For anxiety  . meloxicam (MOBIC) 15 MG tablet Take 15 mg by mouth daily. (Patient not taking: Reported on 12/09/2020)  . nortriptyline (PAMELOR) 10 MG capsule TAKE THREE CAPSULES ( 30 MG) NIGHTLY (Patient not taking: Reported on 12/09/2020)  . rosuvastatin (CRESTOR) 10 MG tablet Take 1 tablet (10 mg total) by mouth daily.  Marland Kitchen tiZANidine (ZANAFLEX) 4 MG tablet 1/2 po bid prn (Patient not taking: Reported on 12/09/2020)  . traMADol (ULTRAM) 50 MG tablet Take one tab po qhs for shoulder pain (Patient not taking: Reported on 12/09/2020)   No facility-administered encounter medications on file as of 12/25/2020.    Surgical History: Past Surgical History:  Procedure Laterality Date  . ANKLE SURGERY Right     Medical History: Past Medical History:  Diagnosis Date  . Elevated ferritin   . Headache   . Hemochromatosis   . Hyperlipidemia   . Hyponatremia   . Medical history non-contributory   . Shoulder injury     Family History: Family History  Problem Relation Age of Onset  . Liver disease Other    . Stroke Mother   . Breast cancer Neg Hx     Social History   Socioeconomic History  . Marital status: Married    Spouse name: Not on file  . Number of children: Not on file  . Years of education: Not on file  . Highest education level: Not on file  Occupational History  . Not on file  Tobacco Use  . Smoking status: Never Smoker  . Smokeless tobacco: Never Used  Vaping Use  . Vaping Use: Never used  Substance and Sexual Activity  . Alcohol use: No    Alcohol/week: 0.0 standard drinks  . Drug use: No  . Sexual activity: Yes    Partners: Male  Other Topics Concern  . Not on file  Social History Narrative  . Not on file   Social Determinants of Health   Financial Resource Strain: Not on file  Food Insecurity: Not on file  Transportation Needs: Not on file  Physical Activity: Not on file  Stress: Not on file  Social Connections: Not on file  Intimate Partner Violence: Not on file      Review of Systems  Constitutional: Positive for fatigue. Negative for chills and unexpected weight change.  HENT: Negative for congestion, postnasal drip, rhinorrhea, sneezing and sore throat.   Eyes: Negative for redness.  Respiratory: Positive for apnea. Negative for cough, chest tightness and shortness of breath.   Cardiovascular: Negative for chest pain and palpitations.  Gastrointestinal: Negative  for abdominal pain, constipation, diarrhea, nausea and vomiting.  Genitourinary: Negative for difficulty urinating, dysuria and frequency.  Musculoskeletal: Positive for back pain. Negative for arthralgias, joint swelling and neck pain.  Skin: Negative for rash.  Neurological: Negative.  Negative for tremors and numbness.  Hematological: Negative for adenopathy. Does not bruise/bleed easily.  Psychiatric/Behavioral: Positive for sleep disturbance. Negative for behavioral problems (Depression) and suicidal ideas. The patient is nervous/anxious.     Vital Signs: BP (!) 149/81    Pulse 68   Temp 98.3 F (36.8 C)   Resp 16   Ht 5\' 5"  (1.651 m)   Wt 152 lb 9.6 oz (69.2 kg)   LMP  (LMP Unknown)   SpO2 96%   BMI 25.39 kg/m    Physical Exam Vitals and nursing note reviewed.  Constitutional:      General: She is not in acute distress.    Appearance: She is well-developed. She is not diaphoretic.  HENT:     Head: Normocephalic and atraumatic.     Mouth/Throat:     Pharynx: No oropharyngeal exudate.  Eyes:     Pupils: Pupils are equal, round, and reactive to light.  Neck:     Thyroid: No thyromegaly.     Vascular: No JVD.     Trachea: No tracheal deviation.  Cardiovascular:     Rate and Rhythm: Normal rate and regular rhythm.     Heart sounds: Normal heart sounds. No murmur heard. No friction rub. No gallop.   Pulmonary:     Effort: Pulmonary effort is normal. No respiratory distress.     Breath sounds: No wheezing or rales.  Chest:     Chest wall: No tenderness.  Abdominal:     General: Bowel sounds are normal.     Palpations: Abdomen is soft.  Musculoskeletal:        General: Normal range of motion.     Cervical back: Normal range of motion and neck supple.  Lymphadenopathy:     Cervical: No cervical adenopathy.  Skin:    General: Skin is warm and dry.  Neurological:     Mental Status: She is alert and oriented to person, place, and time.     Cranial Nerves: No cranial nerve deficit.  Psychiatric:        Behavior: Behavior normal.        Thought Content: Thought content normal.        Judgment: Judgment normal.        Assessment/Plan: 1. Mixed hyperlipidemia Continue crestor, will recheck lipid panel--may need to increase dose of crestor. Pt has allergy to tricor - Lipid Panel With LDL/HDL Ratio  2. Low back pain without sciatica, unspecified back pain laterality, unspecified chronicity - POCT Urinalysis Dipstick negative in office; pt should monitor symptoms and let know if worsening  3. Sleep disturbance Pt would benefit from  a sleep study due to long history of loud snoring, witnessed apneas, and sleep difficulty. Will move forward with scheduling home sleep study at this time.  4. Hereditary hemochromatosis (HCC) Follow by heme/onc   General Counseling: Mechele verbalizes understanding of the findings of todays visit and agrees with plan of treatment. I have discussed any further diagnostic evaluation that may be needed or ordered today. We also reviewed her medications today. she has been encouraged to call the office with any questions or concerns that should arise related to todays visit.    Orders Placed This Encounter  Procedures  . Lipid Panel With  LDL/HDL Ratio  . POCT Urinalysis Dipstick    No orders of the defined types were placed in this encounter.   This patient was seen by Lynn Ito, PA-C in collaboration with Dr. Beverely Risen as a part of collaborative care agreement.   Total time spent:30 Minutes Time spent includes review of chart, medications, test results, and follow up plan with the patient.      Dr Lyndon Code Internal medicine

## 2020-12-26 LAB — LIPID PANEL WITH LDL/HDL RATIO
Cholesterol, Total: 187 mg/dL (ref 100–199)
HDL: 45 mg/dL (ref >39)
LDL Chol Calc (NIH): 76 mg/dL (ref 0–99)
LDL/HDL Ratio: 1.7 ratio (ref 0.0–3.2)
Triglycerides: 417 mg/dL — ABNORMAL HIGH (ref 0–149)
VLDL Cholesterol Cal: 66 mg/dL — ABNORMAL HIGH (ref 5–40)

## 2021-01-19 ENCOUNTER — Encounter: Payer: Self-pay | Admitting: Nurse Practitioner

## 2021-01-28 ENCOUNTER — Encounter: Payer: Self-pay | Admitting: Nurse Practitioner

## 2021-01-28 ENCOUNTER — Telehealth: Payer: Self-pay

## 2021-01-28 NOTE — Telephone Encounter (Signed)
Pt put ear wax removal into her eye by accident. It was itchy and pt flushed eyes with water. Spoke to Dr. Welton Flakes and she recommended pt to flush eyes with cool water and to call pt's eye doctor if irration continues.

## 2021-02-04 ENCOUNTER — Other Ambulatory Visit: Payer: Self-pay

## 2021-02-04 ENCOUNTER — Ambulatory Visit
Admission: RE | Admit: 2021-02-04 | Discharge: 2021-02-04 | Disposition: A | Discharge status: Home / Self Care | Payer: Medicare Other | Source: Ambulatory Visit | Attending: Hematology and Oncology | Admitting: Hematology and Oncology

## 2021-02-11 ENCOUNTER — Other Ambulatory Visit: Payer: Self-pay

## 2021-02-11 ENCOUNTER — Encounter: Payer: Self-pay | Admitting: Oncology

## 2021-02-11 ENCOUNTER — Inpatient Hospital Stay: Payer: Medicare Other | Attending: Oncology | Admitting: Oncology

## 2021-02-11 ENCOUNTER — Encounter: Payer: Self-pay | Admitting: Nurse Practitioner

## 2021-02-11 VITALS — BP 122/63 | HR 73 | Temp 98.2°F | Resp 16 | Wt 149.5 lb

## 2021-02-11 DIAGNOSIS — K760 Fatty (change of) liver, not elsewhere classified: Secondary | ICD-10-CM | POA: Insufficient documentation

## 2021-02-11 DIAGNOSIS — R7989 Other specified abnormal findings of blood chemistry: Secondary | ICD-10-CM | POA: Diagnosis not present

## 2021-02-12 ENCOUNTER — Encounter: Payer: Self-pay | Admitting: Nurse Practitioner

## 2021-02-12 NOTE — Progress Notes (Signed)
Hematology/Oncology Consult note [facility]  [phone] [phone]  Patient Care Team: Lyndon Code, MD as PCP - General (Internal Medicine)   Name of the patient: Tammy Lam  [id number]  07-05-1956   Date of visit: 02/12/21  Diagnosis- 1.  Elevated ferritin 2.  Heterozygosity for H63D  Chief complaint/ Reason for visit-routine follow-up of elevated ferritin and hereditary hemochromatosis  Heme/Onc history: Patient is a 65 year old [place] female.  History obtained with the help of [place] interpreter.  She was diagnosed with heterozygosity for H63D back in April 2017.  She has had mildly elevated ferritin that fluctuates between 400-500 since then.  Iron saturation is mainly remain between 30 to 45% but never higher.  Right upper quadrant ultrasound in June 2020 showed possible fatty infiltration with potential 3.1 cm mass lesion versus area of focal sparing.  She had a liver MRI in July 2020 which showed moderate to severe hepatic steatosis.  Focal fatty sparing in the central left lobe corresponding to the lesion seen on ultrasound.  No evidence of abnormal iron deposition was seen.  Hepatitis B and C serologies were negative in May 2020.  AFP has been within normal limits so far.  She was seen again in April 2022 and underwent 1 session of phlebotomy.  She has not seen GI.  She is a nonalcoholic.  Interval history-history obtained with the help of [place] interpreter.  Patient denies any specific complaints at this time.  Appetite and weight have remained stable  ECOG PS- 1 Pain scale- 0   Review of systems- Review of Systems  Constitutional:  Negative for chills, fever, malaise/fatigue and weight loss.  HENT:  Negative for congestion, ear discharge and nosebleeds.   Eyes:  Negative for blurred vision.  Respiratory:  Negative for cough, hemoptysis, sputum production, shortness of breath and wheezing.   Cardiovascular:   Negative for chest pain, palpitations, orthopnea and claudication.  Gastrointestinal:  Negative for abdominal pain, blood in stool, constipation, diarrhea, heartburn, melena, nausea and vomiting.  Genitourinary:  Negative for dysuria, flank pain, frequency, hematuria and urgency.  Musculoskeletal:  Negative for back pain, joint pain and myalgias.  Skin:  Negative for rash.  Neurological:  Negative for dizziness, tingling, focal weakness, seizures, weakness and headaches.  Endo/Heme/Allergies:  Does not bruise/bleed easily.  Psychiatric/Behavioral:  Negative for depression and suicidal ideas. The patient does not have insomnia.      Allergies  Allergen Reactions   Diphenhydramine Other (See Comments)   Benadryl [Diphenhydramine Hcl] Itching   Influenza Vaccines    Penicillins Itching   Tricor [Fenofibrate]      Past Medical History:  Diagnosis Date   Elevated ferritin    Headache    Hemochromatosis    Hyperlipidemia    Hyponatremia    Medical history non-contributory    Shoulder injury      Past Surgical History:  Procedure Laterality Date   ANKLE SURGERY Right     Social History   Socioeconomic History   Marital status: Married    Spouse name: Not on file   Number of children: Not on file   Years of education: Not on file   Highest education level: Not on file  Occupational History   Not on file  Tobacco Use   Smoking status: Never   Smokeless tobacco: Never  Vaping Use   Vaping Use: Never used  Substance and Sexual Activity   Alcohol use: No    Alcohol/week: 0.0 standard  drinks   Drug use: No   Sexual activity: Yes    Partners: Male  Other Topics Concern   Not on file  Social History Narrative   Not on file   Social Determinants of Health   Financial Resource Strain: Not on file  Food Insecurity: Not on file  Transportation Needs: Not on file  Physical Activity: Not on file  Stress: Not on file  Social Connections: Not on file  Intimate Partner  Violence: Not on file    Family History  Problem Relation Age of Onset   Liver disease Other    Stroke Mother    Breast cancer Neg Hx      Current Outpatient Medications:    rosuvastatin (CRESTOR) 10 MG tablet, Take 1 tablet (10 mg total) by mouth daily., Disp: 90 tablet, Rfl: 3   clindamycin (CLEOCIN) 300 MG capsule, Take 300 mg by mouth 3 (three) times daily. (Patient not taking: Reported on 02/11/2021), Disp: , Rfl:    ibuprofen (ADVIL) 600 MG tablet, Take 600 mg by mouth every 6 (six) hours as needed. (Patient not taking: Reported on 02/11/2021), Disp: , Rfl:   Physical exam:  Vitals:   02/11/21 1114  BP: 122/63  Pulse: 73  Resp: 16  Temp: 98.2 F (36.8 C)  SpO2: 96%  Weight: 149 lb 7.6 oz (67.8 kg)   Physical Exam Constitutional:      General: She is not in acute distress. Cardiovascular:     Rate and Rhythm: Normal rate and regular rhythm.     Heart sounds: Normal heart sounds.  Pulmonary:     Effort: Pulmonary effort is normal.     Breath sounds: Normal breath sounds.  Abdominal:     General: Bowel sounds are normal.     Palpations: Abdomen is soft.  Skin:    General: Skin is warm and dry.  Neurological:     Mental Status: She is alert and oriented to person, place, and time.     CMP Latest Ref Rng & Units 12/01/2020  Glucose 70 - 99 mg/dL 102(H)  BUN 8 - 23 mg/dL 11  Creatinine 8.52 - 7.78 mg/dL 2.42  Sodium 353 - 614 mmol/L 138  Potassium 3.5 - 5.1 mmol/L 3.7  Chloride 98 - 111 mmol/L 106  CO2 22 - 32 mmol/L 26  Calcium 8.9 - 10.3 mg/dL 9.2  Total Protein 6.5 - 8.1 g/dL 7.1  Total Bilirubin 0.3 - 1.2 mg/dL 0.5  Alkaline Phos 38 - 126 U/L 100  AST 15 - 41 U/L 33  ALT 0 - 44 U/L 33   CBC Latest Ref Rng & Units 12/01/2020  WBC 4.0 - 10.5 K/uL 5.7  Hemoglobin 12.0 - 15.0 g/dL 15.3(H)  Hematocrit 36.0 - 46.0 % 44.4  Platelets 150 - 400 K/uL 159    No images are attached to the encounter.  US ABDOMEN LIMITED RUQ (LIVER/GB)  Result Date:  02/05/2021 CLINICAL DATA:  RIGHT upper quadrant pain for 1 year, hemochromatosis, surveillance EXAM: ULTRASOUND ABDOMEN LIMITED RIGHT UPPER QUADRANT COMPARISON:  08/06/2020 FINDINGS: Gallbladder: Normally distended without stones or wall thickening. No pericholecystic fluid or sonographic Murphy sign. Common bile duct: Diameter: 3 mm, normal Liver: Diffusely increased hepatic echogenicity, can be seen with fatty infiltration, cirrhosis, and hemochromatosis. Hepatic margins appear smooth. No focal hepatic mass lesion or nodularity. Portal vein is patent on color Doppler imaging with normal direction of blood flow towards the liver. Other: No RIGHT upper quadrant free fluid. IMPRESSION: Diffusely increased hepatic  echogenicity which can be seen with fatty infiltration, cirrhosis, and hemochromatosis. No focal hepatic mass or nodularity identified. Electronically Signed   By: Ulyses Southward M.D.   On: 02/05/2021 08:41     Assessment and plan- Patient is a 65 y.o. female with history of heterozygosity for H63D and severe fatty liver and abnormal ferritin here for routine follow-up  MRI back in July 2020 showed evidence of severe fatty liver but no evidence of iron overload.  Typically heterozygosity for H63D does not lead to symptomatic iron overload.  However her ferritin levels are consistently remain between 400-500 and it is unclear as to why.  It could be very well secondary to fatty liver itself unrelated to hemochromatosis.  Iron saturation has never been high.  Therefore I do not feel that patient clinically has any symptomatic iron overload that would require a phlebotomy at this time.    Given her severe hacking liver despite normal LFTs and elevated ferritin I would like her to be referred to GI for further management and consideration for liver biopsy.  She did have an ultrasound on 02/04/2021 which showed again increased hepatic echogenicity which can be seen with fatty infiltration cirrhosis and  hemochromatosis.  Her labs so far are not indicated of of cirrhosis.  I will see her back in 3 months with CBC with differential CMP ferritin and iron studies and AFP  I have personally spoken to GI Dr. Tobi Bastos and given her an appointment in August as patient is going out of town prior to that   Total face to face encounter time for this patient visit was 40 min.    Visit Diagnosis 1. High serum ferritin   2. Hereditary hemochromatosis (HCC)      Dr. Owens Shark, MD, MPH [facility] [phone] 02/12/2021 8:09 AM

## 2021-02-16 ENCOUNTER — Encounter: Payer: Self-pay | Admitting: *Deleted

## 2021-03-05 ENCOUNTER — Ambulatory Visit: Payer: Medicare Other | Admitting: Gastroenterology

## 2021-04-16 ENCOUNTER — Other Ambulatory Visit: Payer: Self-pay

## 2021-04-16 ENCOUNTER — Encounter: Payer: Self-pay | Admitting: Physician Assistant

## 2021-04-16 ENCOUNTER — Telehealth: Payer: Self-pay

## 2021-04-16 ENCOUNTER — Ambulatory Visit (INDEPENDENT_AMBULATORY_CARE_PROVIDER_SITE_OTHER): Payer: Medicare Other | Admitting: Physician Assistant

## 2021-04-16 DIAGNOSIS — R0989 Other specified symptoms and signs involving the circulatory and respiratory systems: Secondary | ICD-10-CM | POA: Diagnosis not present

## 2021-04-16 DIAGNOSIS — E559 Vitamin D deficiency, unspecified: Secondary | ICD-10-CM

## 2021-04-16 DIAGNOSIS — E2839 Other primary ovarian failure: Secondary | ICD-10-CM

## 2021-04-16 DIAGNOSIS — E782 Mixed hyperlipidemia: Secondary | ICD-10-CM | POA: Diagnosis not present

## 2021-04-16 DIAGNOSIS — R5383 Other fatigue: Secondary | ICD-10-CM

## 2021-04-16 DIAGNOSIS — E538 Deficiency of other specified B group vitamins: Secondary | ICD-10-CM

## 2021-04-16 MED ORDER — ROSUVASTATIN CALCIUM 10 MG PO TABS: 10.0000 mg | ORAL_TABLET | Freq: Every day | ORAL | 3 refills | Status: DC

## 2021-04-16 NOTE — Telephone Encounter (Signed)
I scheduled bone density at patient's request wit [facility]. She stated they will have interpreter for patient since spouse cannot go into exam room. I notified spouse with appointment date & time-Toni

## 2021-04-16 NOTE — Progress Notes (Signed)
[facility] [address] [place], [place] [address]  Internal MEDICINE  Office Visit Note  Patient Name: Tammy Lam  [id number]  [id number]  Date of Service: 04/18/2021  Chief Complaint  Patient presents with   Follow-up   Hyperlipidemia    HPI Pt is here for routine follow up -Followed by heme/onc for elevated ferritin. Being sent to GI on aug 29th for further eval -Lipids greatly improved and tolerating crestor well, Will check carotid US for further eval -Home sleep study was not covered and is unwilling to try in-lab study. Doesn't feel like she really needs this testing. -Shoulder is still painful at times, bu stopping getting injections with ortho and states rest and massage helps -Due for bone density  Current Medication: Outpatient Encounter Medications as of 04/16/2021  Medication Sig   [DISCONTINUED] rosuvastatin (CRESTOR) 10 MG tablet Take 1 tablet (10 mg total) by mouth daily.   rosuvastatin (CRESTOR) 10 MG tablet Take 1 tablet (10 mg total) by mouth daily.   [DISCONTINUED] clindamycin (CLEOCIN) 300 MG capsule Take 300 mg by mouth 3 (three) times daily. (Patient not taking: Reported on 02/11/2021)   [DISCONTINUED] ibuprofen (ADVIL) 600 MG tablet Take 600 mg by mouth every 6 (six) hours as needed. (Patient not taking: Reported on 02/11/2021)   No facility-administered encounter medications on file as of 04/16/2021.    Surgical History: Past Surgical History:  Procedure Laterality Date   ANKLE SURGERY Right     Medical History: Past Medical History:  Diagnosis Date   Elevated ferritin    Headache    Hemochromatosis    Hyperlipidemia    Hyponatremia    Medical history non-contributory    Shoulder injury     Family History: Family History  Problem Relation Age of Onset   Liver disease Other    Stroke Mother    Breast cancer Neg Hx     Social History   Socioeconomic History   Marital status: Married    Spouse name: Not on file   Number of  children: Not on file   Years of education: Not on file   Highest education level: Not on file  Occupational History   Not on file  Tobacco Use   Smoking status: Never   Smokeless tobacco: Never  Vaping Use   Vaping Use: Never used  Substance and Sexual Activity   Alcohol use: No    Alcohol/week: 0.0 standard drinks   Drug use: No   Sexual activity: Yes    Partners: Male  Other Topics Concern   Not on file  Social History Narrative   Not on file   Social Determinants of Health   Financial Resource Strain: Not on file  Food Insecurity: Not on file  Transportation Needs: Not on file  Physical Activity: Not on file  Stress: Not on file  Social Connections: Not on file  Intimate Partner Violence: Not on file      Review of Systems  Constitutional:  Negative for chills, fatigue and unexpected weight change.  HENT:  Negative for congestion, postnasal drip, rhinorrhea, sneezing and sore throat.   Eyes:  Negative for redness.  Respiratory:  Negative for cough, chest tightness and shortness of breath.   Cardiovascular:  Negative for chest pain and palpitations.  Gastrointestinal:  Negative for abdominal pain, constipation, diarrhea, nausea and vomiting.  Genitourinary:  Negative for dysuria and frequency.  Musculoskeletal:  Positive for arthralgias. Negative for back pain, joint swelling and neck pain.  Skin:  Negative for rash.  Neurological: Negative.  Negative for tremors and numbness.  Hematological:  Negative for adenopathy. Does not bruise/bleed easily.  Psychiatric/Behavioral:  Positive for sleep disturbance. Negative for behavioral problems (Depression) and suicidal ideas. The patient is not nervous/anxious.    Vital Signs: BP 120/80   Pulse 61   Temp 97.8 F (36.6 C)   Resp 16   Ht 5\' 5"  (1.651 m)   Wt 148 lb (67.1 kg)   LMP  (LMP Unknown)   SpO2 95%   BMI 24.63 kg/m    Physical Exam Vitals and nursing note reviewed.  Constitutional:      General: She  is not in acute distress.    Appearance: She is well-developed and normal weight. She is not diaphoretic.  HENT:     Head: Normocephalic and atraumatic.     Mouth/Throat:     Pharynx: No oropharyngeal exudate.  Eyes:     Pupils: Pupils are equal, round, and reactive to light.  Neck:     Thyroid: No thyromegaly.     Vascular: No JVD.     Trachea: No tracheal deviation.  Cardiovascular:     Rate and Rhythm: Normal rate and regular rhythm.     Heart sounds: Normal heart sounds. No murmur heard.   No friction rub. No gallop.  Pulmonary:     Effort: Pulmonary effort is normal. No respiratory distress.     Breath sounds: No wheezing or rales.  Chest:     Chest wall: No tenderness.  Abdominal:     General: Bowel sounds are normal.     Palpations: Abdomen is soft.  Musculoskeletal:        General: Normal range of motion.     Cervical back: Normal range of motion and neck supple.  Lymphadenopathy:     Cervical: No cervical adenopathy.  Skin:    General: Skin is warm and dry.  Neurological:     Mental Status: She is alert and oriented to person, place, and time.     Cranial Nerves: No cranial nerve deficit.  Psychiatric:        Behavior: Behavior normal.        Thought Content: Thought content normal.        Judgment: Judgment normal.       Assessment/Plan: 1. Mixed hyperlipidemia Will continue crestor and update labs prior to CPE - rosuvastatin (CRESTOR) 10 MG tablet; Take 1 tablet (10 mg total) by mouth daily.  Dispense: 90 tablet; Refill: 3 - Carotid Duplex Bilateral; Future - Lipid Panel With LDL/HDL Ratio  2. Hereditary hemochromatosis (HCC) Followed by heme/onc and GI  3. Primary ovarian failure - DG Bone Density; Future  4. Bilateral carotid bruits - US Carotid Duplex Bilateral; Future  5. Vitamin D deficiency - VITAMIN D 25 Hydroxy (Vit-D Deficiency, Fractures)  6. B12 deficiency - B12 and Folate Panel  7. Other fatigue - CBC w/Diff/Platelet - TSH +  free T4 - Comprehensive metabolic panel   General Counseling: Tammy Lam verbalizes understanding of the findings of todays visit and agrees with plan of treatment. I have discussed any further diagnostic evaluation that may be needed or ordered today. We also reviewed her medications today. she has been encouraged to call the office with any questions or concerns that should arise related to todays visit.    Orders Placed This Encounter  Procedures   DG Bone Density   US Carotid Duplex Bilateral   CBC w/Diff/Platelet   TSH + free  T4   Lipid Panel With LDL/HDL Ratio   Comprehensive metabolic panel   VITAMIN D 25 Hydroxy (Vit-D Deficiency, Fractures)   B12 and Folate Panel    Meds ordered this encounter  Medications   rosuvastatin (CRESTOR) 10 MG tablet    Sig: Take 1 tablet (10 mg total) by mouth daily.    Dispense:  90 tablet    Refill:  3    This patient was seen by Lynn Ito, PA-C in collaboration with Dr. Beverely Risen as a part of collaborative care agreement.   Total time spent:30 Minutes Time spent includes review of chart, medications, test results, and follow up plan with the patient.      Dr Lyndon Code Internal medicine

## 2021-04-20 ENCOUNTER — Ambulatory Visit: Payer: Medicare Other | Admitting: Gastroenterology

## 2021-04-22 ENCOUNTER — Ambulatory Visit (INDEPENDENT_AMBULATORY_CARE_PROVIDER_SITE_OTHER): Payer: Medicare Other

## 2021-04-22 ENCOUNTER — Other Ambulatory Visit: Payer: Self-pay

## 2021-04-22 DIAGNOSIS — R0989 Other specified symptoms and signs involving the circulatory and respiratory systems: Secondary | ICD-10-CM

## 2021-04-22 DIAGNOSIS — E782 Mixed hyperlipidemia: Secondary | ICD-10-CM

## 2021-05-04 ENCOUNTER — Ambulatory Visit
Admission: RE | Admit: 2021-05-04 | Discharge: 2021-05-04 | Disposition: A | Discharge status: Home / Self Care | Payer: Medicare Other | Source: Ambulatory Visit | Attending: Physician Assistant | Admitting: Physician Assistant

## 2021-05-04 ENCOUNTER — Other Ambulatory Visit: Payer: Self-pay

## 2021-05-04 DIAGNOSIS — E2839 Other primary ovarian failure: Secondary | ICD-10-CM | POA: Insufficient documentation

## 2021-05-06 ENCOUNTER — Encounter: Payer: Self-pay | Admitting: Gastroenterology

## 2021-05-06 ENCOUNTER — Ambulatory Visit (INDEPENDENT_AMBULATORY_CARE_PROVIDER_SITE_OTHER): Payer: Medicare Other | Admitting: Gastroenterology

## 2021-05-06 ENCOUNTER — Other Ambulatory Visit: Payer: Self-pay

## 2021-05-06 VITALS — BP 123/81 | HR 72 | Temp 98.3°F | Ht 65.0 in | Wt 147.8 lb

## 2021-05-06 DIAGNOSIS — K760 Fatty (change of) liver, not elsewhere classified: Secondary | ICD-10-CM

## 2021-05-06 DIAGNOSIS — Z1211 Encounter for screening for malignant neoplasm of colon: Secondary | ICD-10-CM

## 2021-05-06 DIAGNOSIS — R7989 Other specified abnormal findings of blood chemistry: Secondary | ICD-10-CM | POA: Diagnosis not present

## 2021-05-06 NOTE — Progress Notes (Signed)
Jonathon Bellows MD, MRCP(U.K) [address]  [place]  [place], [place] [address]  Main: [phone]  Fax: [phone]   Gastroenterology Consultation  Referring Provider:     Dr Janese Banks Primary Care Physician:  Lavera Guise, MD Primary Gastroenterologist:  Dr. Jonathon Bellows  Reason for Consultation:     Elevated ferritin        HPI:   Tammy Lam is a 65 y.o. y/o female referred for consultation & management  by Dr. Janese Banks for high ferritin.   The patient speaks only [place] and entire visit is conducted via an interpreter.  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  Iron saturation has been always less than 50%.  Right upper quadrant ultrasound in June 2020 showed possible fatty infiltration with a 3.1 centimeter mass versus area of focal sparing.  MRI showed moderate to severe hepatic steatosis and focal fatty sparing in the lobe corresponding to the lesion seen on the ultrasound.  No evidence of abnormal iron deposition was seen.  Hepatitis B and C serologies were negative.  She has been referred to GI for further evaluation.  Concern is if she does have liver cirrhosis.  Dr. Janese Banks has personally spoken to me about this patient.  02/05/2021: Right upper quadrant ultrasound shows diffuse increased hepatic echogenicity seen with fatty infiltration cirrhosis and hemochromatosis. 12/09/2020: Ferritin 516, iron and TIBC normal CRP and ESR not significantly elevated AFP 4.5 CMP in April 2022 showed normal transaminases and albumin.  Hemoglobin in April 2022 was 15.3 with a platelet count of 159.  There has been isolated.  When her platelet count of 145.  She denies any personal or family history of liver disease.  No tattoos.  No prior blood transfusion.  No Armed forces logistics/support/administrative officer.  At the most you have 120 pounds.  Denies any GI issues.  Not had a prior colonoscopy.  Not keen on one presently.  Past Medical History:  Diagnosis Date   Elevated  ferritin    Headache    Hemochromatosis    Hyperlipidemia    Hyponatremia    Medical history non-contributory    Shoulder injury     Past Surgical History:  Procedure Laterality Date   ANKLE SURGERY Right     Prior to Admission medications   Medication Sig Start Date End Date Taking? Authorizing Provider  rosuvastatin (CRESTOR) 10 MG tablet Take 1 tablet (10 mg total) by mouth daily. 04/16/21   McDonough, Si Gaul, PA-C    Family History  Problem Relation Age of Onset   Liver disease Other    Stroke Mother    Breast cancer Neg Hx      Social History   Tobacco Use   Smoking status: Never   Smokeless tobacco: Never  Vaping Use   Vaping Use: Never used  Substance Use Topics   Alcohol use: No    Alcohol/week: 0.0 standard drinks   Drug use: No    Allergies as of 05/06/2021 - Review Complete 05/06/2021  Allergen Reaction Noted   Diphenhydramine Other (See Comments) 09/15/2018   Benadryl [diphenhydramine hcl] Itching 01/13/2015   Influenza vaccines  10/04/2017   Penicillins Itching 01/13/2015   Tricor [fenofibrate]  07/22/2020    Review of Systems:    All systems reviewed and negative except where noted in HPI.   Physical Exam:  BP 123/81   Pulse 72   Temp 98.3 F (36.8 C) (Oral)   Ht '5\' 5"'  (  1.651 m)   Wt 147 lb 12.8 oz (67 kg)   LMP  (LMP Unknown)   BMI 24.60 kg/m  No LMP recorded (lmp unknown). Patient is postmenopausal. Psych:  Alert and cooperative. Normal mood and affect. General:   Alert,  Well-developed, well-nourished, pleasant and cooperative in NAD Head:  Normocephalic and atraumatic. Eyes:  Sclera clear, no icterus.   Conjunctiva pink. Ears:  Normal auditory acuity. Neurologic:  Alert and oriented x3;  grossly normal neurologically. Psych:  Alert and cooperative. Normal mood and affect.  Imaging Studies: DG Bone Density  Result Date: 05/04/2021 EXAM: DUAL X-RAY ABSORPTIOMETRY (DXA) FOR BONE MINERAL DENSITY IMPRESSION: Dear Dr. Chauncey Cruel,  [name] completed a FRAX assessment on 05/04/2021 using the [place] (analysis version: 14.10) manufactured by [employer]. The following summarizes the results of our evaluation. PATIENT BIOGRAPHICAL: Name: Tammy Lam, Tammy Lam Patient ID: [id number] Birth Date: December 06, 1955 Height:    63.0 in. Gender:     Female    [age]       Weight:    147.0 lbs. Ethnicity:  Asian                            Exam Date: 05/04/2021 FRAX* RESULTS:  (version: 3.5) 10-year Probability of Fracture1 Major Osteoporotic Fracture2 Hip Fracture 3.7% 0.1% Population: [place] (Asian) Risk Factors: None Based on Femur (Right) Neck BMD 1 -The 10-year probability of fracture may be lower than reported if the patient has received treatment. 2 -Major Osteoporotic Fracture: Clinical Spine, Forearm, Hip or Shoulder *FRAX is a Materials engineer of the [employer] for Metabolic Bone Disease, a [place] (WHO) [facility]. ASSESSMENT: The probability of a major osteoporotic fracture is 3.7% within the next ten years. The probability of a hip fracture is 0.1% within the next ten years. . [name] completed a BMD test on 05/04/2021 using the [place] (software version: 14.10) manufactured by [employer]. The following summarizes the results of our evaluation. Technologist: [facility] PATIENT BIOGRAPHICAL: Name: Tammy Lam, Tammy Lam Patient ID: [id number] Birth Date: 1955/12/19 Height: 63.0 in. Gender: Female Exam Date: 05/04/2021 Weight: 147.0 lbs. Indications: Postmenopausal Fractures:             Treatments: DENSITOMETRY RESULTS: Site      Region     Measured Date Measured Age WHO Classification Young Adult T-score BMD         %Change vs. Previous Significant Change (*) AP Spine L1-L2 05/04/2021 65.4 Osteopenia -2.0 0.930 g/cm2 DualFemur Neck Right 05/04/2021 65.4 Normal -0.1 1.018 g/cm2 ASSESSMENT: The BMD measured at AP Spine L1-L2 is 0.930 g/cm2 with a  T-score of -2.0. This patient is considered osteopenic according to [facility]) criteria. The scan quality is good. L-3 & 4was excluded due to degenerative changes. World Pharmacologist [facility] criteria for post-menopausal, Caucasian Women: Normal:                   T-score at or above -1 SD Osteopenia/low bone mass: T-score between -1 and -2.5 SD Osteoporosis:             T-score at or below -2.5 SD RECOMMENDATIONS: 1. All patients should optimize calcium and vitamin D intake. 2. Consider FDA-approved medical therapies in postmenopausal women and men aged 39 years and older, based on the following: a. A hip or vertebral(clinical  or morphometric) fracture b. T-score < -2.5 at the femoral neck or spine after appropriate evaluation to exclude secondary causes c. Low bone mass (T-score between -1.0 and -2.5 at the femoral neck or spine) and a 10-year probability of a hip fracture > 3% or a 10-year probability of a major osteoporosis-related fracture > 20% based on the [place]-adapted WHO algorithm 3. Clinician judgment and/or patient preferences may indicate treatment for people with 10-year fracture probabilities above or below these levels FOLLOW-UP: People with diagnosed cases of osteoporosis or at high risk for fracture should have regular bone mineral density tests. For patients eligible for Medicare, routine testing is allowed once every 2 years. The testing frequency can be increased to one year for patients who have rapidly progressing disease, those who are receiving or discontinuing medical therapy to restore bone mass, or have additional risk factors. I have reviewed this report, and agree with the above findings. [facility] Electronically Signed   By: Rolm Baptise M.D.   On: 05/04/2021 11:38    Assessment and Plan:   Tammy Lam is a 65 y.o. y/o female has been referred for elevated ferritin and possible fatty infiltration of the liver.  No clear biochemical evidence of  liver cirrhosis but ultrasound was noncommittal about the same.  The question is whether she has iron deposits in the liver although not much was seen on the MRI.  I will obtain below evaluation.  We will try and evaluate for cirrhosis with elastography.  Very likely that she would require a biopsy of the liver to determine if the changes in the liver are due to NASH or iron deposition or a combination of both.  It is also very likely that elevated ferritin is secondary to nonalcoholic fatty liver disease   Plan 1.  Full autoimmune and viral hepatitis panel. 2.  Liver elastography.  If it shows features of cirrhosis then we will manage the patient as so.  If there is no clear evidence of cirrhosis may need to consider liver biopsy and quantify iron content on the biopsy.  Liver biopsy will also help differentiate between [facility] cirrhosis versus fibrosis from iron overload or probably could be a combination as well. 3.  I will check CBC, CMP, INR today. 4.  Patient will need vaccination for hepatitis a and B if not immune. 5.  Screening colonoscopy: Patient refused 6.  I have discussed in brief about nonalcoholic fatty liver disease and lifestyle changes needed for the same  Follow up in 8 weeks  Dr Jonathon Bellows MD,MRCP(U.K)

## 2021-05-06 NOTE — Patient Instructions (Signed)
Ch? ?? ?n king b?nh gan nhi?m m? khng do r??u, ng??i l?n B?nh gan nhi?m m? khng do r??u l m?t tnh tr?ng gy ra ch?t bo tch t? trong v xung quanh gan. C?n b?nh ny khi?n gan kh ho?t ??ng h?n bnh th??ng. Tun theo m?t ch? ?? ?n u?ng lnh m?nh c th? gip ki?m sot b?nh gan nhi?m m? khng do r??u. N c?ng c th? gip ng?n ng?a ho?c c?i thi?n cc tnh tr?ng lin quan ??n b?nh, ch?ng h?n nh? b?nh tim, ti?u ???ng, huy?t p cao v m?c cholesterol b?t th??ng. Cng v?i t?p th? d?c th??ng xuyn, ch? ?? ?n king ny:  Thc ??y gi?m cn.  Gip ki?m sot l??ng ???ng trong mu.  Gip c?i thi?n cch c? th? s? d?ng insulin. M?o ?? lm theo k? ho?ch ny l g? ??c nhn th?c ph?m  Lun ki?m tra nhn th?c ph?m ?? bi?t:  L??ng ch?t bo bo ha trong th?c ph?m. B?n nn h?n ch? ?n nhi?u ch?t bo bo ha. Ch?t bo bo ha ???c tm th?y trong th?c ph?m t? ??ng v?t, bao g?m th?t v cc s?n ph?m t? s?a nh? b?, pho mt v s?a nguyn ch?t.  L??ng ch?t x? trong th?c ph?m. B?n nn ch?n th?c ph?m giu ch?t x? nh? tri cy, rau v ng? c?c nguyn h?t. C? g?ng cung c?p 25-30 gam (g) ch?t x? m?i ngy. N?u n??ng  Khi n?u ?n, hy s? d?ng cc lo?i d?u t?t cho tim c nhi?u ch?t bo khng bo ha ??n. Chng bao g?m d?u  liu, d?u h?t c?i v d?u b?.  H?n ch? chin ho?c rn th?c ph?m. Thay vo ?, hy n?u th?c ph?m b?ng cc ph??ng php lnh m?nh nh? n??ng, lu?c, h?p v n??ng. L?p k? ho?ch b?a ?n  B?n c th? mu?n theo di l??ng calo n?p vo. ?n ?ng l??ng calo s? gip b?n ??t ???c cn n?ng h?p l. G?p g? v?i m?t chuyn gia dinh d??ng c th? gip b?n b?t ??u.  H?n ch? t?n su?t b?n ?n ?? ?n mang ?i v th?c ?n nhanh. Nh?ng th?c ph?m ny th??ng r?t giu ch?t bo, mu?i v ???ng.  S? d?ng ch? s? ???ng huy?t (GI) ?? l?p k? ho?ch cho b?a ?n c?a b?n. Ch? s? ny cho b?n bi?t m?t lo?i th?c ph?m s? lm t?ng l??ng ???ng trong mu c?a b?n nhanh nh? th? no. Ch?n th?c ph?m c GI th?p (GI nh? h?n 55). Nh?ng th?c ph?m ny m?t nhi?u th?i gian h?n ??  lm t?ng l??ng ???ng trong mu. M?t chuyn gia dinh d??ng ? ??ng k c th? gip b?n xc ??nh cc lo?i th?c ph?m th?p h?n trong thang GI. Cch s?ng  B?n c th? mu?n theo ch? ?? ?n ??a Trung H?i. Ch? ?? ?n king ny bao g?m nhi?u rau, th?t n?c ho?c c, ng? c?c nguyn h?t, tri cy v d?u v ch?t bo lnh m?nh. Ti c th? ?n nh?ng lo?i th?c ph?m no?  Tri cy Chu?i. To. Nh?ng qu? cam. Qu? nho. ?u ??. Xoi. Tri th?ch l?u. Qu? kiwi. B??i. Qu? anh ?o. [place] di?p. Rau chn v?t. ??u [place]. C? c?i. Sp l? tr?ng. C?i b?p. Bng c?i xanh. C r?t. C chua. B ?ao. C tm. Cc lo?i th?o m?c. ?t. Hnh. D?a leo. B?p c?i [place]. Khoai lang v [employer]	[place]. ??u. ??u l?ng. H?t Th?c ph?m lm t? la m ho?c ng? c?c nguyn h?t, bao g?m bnh m, bnh quy gin, ng? c?c  v m ?ng. La m nguyn cm. B?t y?n m?ch khng ???ng. [place]. La m?ch. H?t dim m?ch. G?o l?t ho?c g?o d?i. Bnh ng ho?c bnh b?t m nguyn cm. Th?t v cc lo?i protein khc Th?t n?c. Gia c?m. ??u h?. H?i s?n v ??ng v?t c v?. S?n ph?m b? s?a Cc s?n ph?m t? s?a t bo ho?c khng c ch?t bo, ch?ng h?n nh? s?a chua, pho mt t??i ho?c pho mt. ?? u?ng N??c u?ng. ?? u?ng khng ???ng. Tr. C ph. S?a t bo ho?c tch bo. Cc l?a ch?n thay th? s?a, ch?ng h?n nh? s?a ??u nnh ho?c s?a h?nh nhn. N??c hoa qu? th?t. Ch?t bo v d?u Tri b?. D?u h?t c?i ho?c d?u  liu. Cc lo?i h?t v b? h?t. H?t gi?ng. Gia v? v gia v? M t?c. N?m. T??ng c t bo, t ???ng v n??c s?t th?t n??ng. [place] t bo ho?c khng bo. ?? ng?t v mn trng mi?ng ?? ng?t khng ???ng. Cc m?c ???c li?t k ? trn c th? khng ph?i l danh sch ??y ?? cc lo?i th?c ph?m v ?? u?ng b?n c th? ?n. Lin h? v?i chuyn gia dinh d??ng ?? bi?t thm thng tin. Ti nn h?n ch? ho?c trnh nh?ng th?c ph?m no? Th?t v cc lo?i protein khc H?n ch? ?n th?t ?? t? 1-2 l?n m?t tu?n. S?n ph?m b? s?a S?a ??y ?? ch?t bo. Ch?t bo v d?u D?u c? v d?u d?a. ?? chin rn. Nh?ng th?c  ?n khc Th?c ph?m ch? bi?n. Th?c ph?m c ch?a nhi?u mu?i ho?c natri. ?? ng?t v mn trng mi?ng ?? ng?t c ch?a ???ng. ?? u?ng ?? u?ng c ???ng, ch?ng h?n nh? tr ng?t, s?a l?c, ?? u?ng ng?t c ? v n??c ng?t. R??u bia. Cc m?c ???c li?t k ? trn c th? khng ph?i l danh sch ??y ?? cc lo?i th?c ph?m v ?? u?ng b?n nn trnh. Lin h? v?i chuyn gia dinh d??ng ?? bi?t thm thng tin. Tm thm thng tin ? ?u Vi?n Qu?c gia v? B?nh ti?u ???ng, Tiu ha v B?nh th?n: [employer] B?n tm t?t  B?nh gan nhi?m m? khng do r??u l m?t tnh tr?ng gy ra ch?t bo tch t? trong v xung quanh gan.  Tun theo m?t ch? ?? ?n u?ng lnh m?nh c th? gip ki?m sot b?nh gan nhi?m m? khng do r??u. Ch? ?? ?n u?ng c?a b?n nn c nhi?u tri cy, rau, ng? c?c nguyn h?t v protein n?c.  H?n ch? ?n ch?t bo bo ha. Ch?t bo bo ha ???c tm th?y trong th?c ph?m t? ??ng v?t, bao g?m th?t v cc s?n ph?m t? s?a nh? b?, pho mt v s?a nguyn ch?t.  Ch? ?? ?n king ny thc ??y gi?m cn, gip ki?m sot l??ng ???ng trong mu v gip c?i thi?n cch c? th? s? d?ng insulin.

## 2021-05-09 DIAGNOSIS — R0989 Other specified symptoms and signs involving the circulatory and respiratory systems: Secondary | ICD-10-CM | POA: Insufficient documentation

## 2021-05-09 NOTE — Procedures (Signed)
[facility] [address] [place], [place] [address]  DATE OF SERVICE: April 22, 2021  CAROTID DOPPLER INTERPRETATION:  Bilateral Carotid Ultrsasound and Color Doppler Examination was performed. The RIGHT CCA shows no significant plaque in the vessel. The LEFT CCA shows no significant plaque in the vessel. There was no significant intimal thickening noted in the RIGHT carotid artery. There was no significant intimal thickening in the LEFT carotid artery.  The RIGHT CCA shows peak systolic velocity of 84 cm per second. The end diastolic velocity is 25 cm per second on the RIGHT side. The RIGHT ICA shows peak systolic velocity of 89 per second. RIGHT sided ICA end diastolic velocity is 39 cm per second. The RIGHT ECA shows a peak systolic velocity of 106 cm per second. The ICA/CCA ratio is calculated to be 1.06. This suggests less than 50% stenosis. The Vertebral Artery shows antegrade flow.  The LEFT CCA shows peak systolic velocity of 81 cm per second. The end diastolic velocity is 29 cm per second on the LEFT side. The LEFT ICA shows peak systolic velocity of 84 per second. LEFT sided ICA end diastolic velocity is 31 cm per second. The LEFT ECA shows a peak systolic velocity of 85 cm per second. The ICA/CCA ratio is calculated to be 1.03. This suggests less than 50% stenosis. The Vertebral Artery shows antegrade flow.   Impression:    The RIGHT CAROTID shows less than 50% stenosis. The LEFT CAROTID shows less than 50% stenosis.  There is no significant plaque formation noted on the LEFT and no significant plaque on the RIGHT  side. Consider a repeat Carotid doppler if clinical situation and symptoms warrant in 6-12 months. Patient should be encouraged to change lifestyles such as smoking cessation, regular exercise and dietary modification. Use of statins in the right clinical setting and ASA is encouraged.  Yevonne Pax, MD [facility] Critical Care Medicine

## 2021-05-11 ENCOUNTER — Other Ambulatory Visit: Payer: Self-pay

## 2021-05-11 ENCOUNTER — Ambulatory Visit (INDEPENDENT_AMBULATORY_CARE_PROVIDER_SITE_OTHER): Payer: Medicare Other | Admitting: Physician Assistant

## 2021-05-11 ENCOUNTER — Encounter: Payer: Self-pay | Admitting: Physician Assistant

## 2021-05-11 ENCOUNTER — Telehealth: Payer: Self-pay

## 2021-05-11 VITALS — BP 130/73 | HR 71 | Temp 98.0°F | Resp 16 | Ht 65.0 in | Wt 147.8 lb

## 2021-05-11 DIAGNOSIS — E782 Mixed hyperlipidemia: Secondary | ICD-10-CM

## 2021-05-11 DIAGNOSIS — M8580 Other specified disorders of bone density and structure, unspecified site: Secondary | ICD-10-CM

## 2021-05-11 LAB — IRON,TIBC AND FERRITIN PANEL
Ferritin: 698 ng/mL — ABNORMAL HIGH (ref 15–150)
Iron Saturation: 38 % (ref 15–55)
Iron: 116 ug/dL (ref 27–139)
Total Iron Binding Capacity: 305 ug/dL (ref 250–450)
UIBC: 189 ug/dL (ref 118–369)

## 2021-05-11 LAB — HIV ANTIBODY (ROUTINE TESTING W REFLEX): HIV Screen 4th Generation wRfx: NONREACTIVE

## 2021-05-11 LAB — CK: Total CK: 162 U/L (ref 32–182)

## 2021-05-11 LAB — HEPATITIS B E ANTIGEN: Hep B E Ag: NEGATIVE

## 2021-05-11 LAB — IMMUNOGLOBULINS A/E/G/M, SERUM
IgA/Immunoglobulin A, Serum: 348 mg/dL (ref 87–352)
IgE (Immunoglobulin E), Serum: 153 IU/mL (ref 6–495)
IgG (Immunoglobin G), Serum: 988 mg/dL (ref 586–1602)
IgM (Immunoglobulin M), Srm: 126 mg/dL (ref 26–217)

## 2021-05-11 LAB — ANTI-MICROSOMAL ANTIBODY LIVER / KIDNEY: LKM1 Ab: 1 Units (ref 0.0–20.0)

## 2021-05-11 LAB — HEPATITIS A ANTIBODY, TOTAL: hep A Total Ab: POSITIVE — AB

## 2021-05-11 LAB — ALPHA-1-ANTITRYPSIN: A-1 Antitrypsin: 132 mg/dL (ref 101–187)

## 2021-05-11 LAB — CELIAC DISEASE AB SCREEN W/RFX
Antigliadin Abs, IgA: 13 units (ref 0–19)
Transglutaminase IgA: <2 U/mL (ref 0–3)

## 2021-05-11 LAB — HEPATITIS B SURFACE ANTIGEN: Hepatitis B Surface Ag: NEGATIVE

## 2021-05-11 LAB — GAMMA GT: GGT: 31 IU/L (ref 0–60)

## 2021-05-11 LAB — HEPATITIS B CORE ANTIBODY, TOTAL: Hep B Core Total Ab: NEGATIVE

## 2021-05-11 LAB — ANA: Anti Nuclear Antibody (ANA): POSITIVE — AB

## 2021-05-11 LAB — MITOCHONDRIAL/SMOOTH MUSCLE AB PNL
Mitochondrial Ab: <20 Units (ref 0.0–20.0)
Smooth Muscle Ab: 9 Units (ref 0–19)

## 2021-05-11 LAB — HEPATITIS B E ANTIBODY: Hep B E Ab: POSITIVE — AB

## 2021-05-11 LAB — HEPATITIS B SURFACE ANTIBODY,QUALITATIVE: Hep B Surface Ab, Qual: REACTIVE

## 2021-05-11 LAB — CERULOPLASMIN: Ceruloplasmin: 19.1 mg/dL (ref 19.0–39.0)

## 2021-05-11 LAB — HEPATITIS C ANTIBODY: Hep C Virus Ab: 0.2 s/co ratio (ref 0.0–0.9)

## 2021-05-11 NOTE — Progress Notes (Signed)
[facility] [address] [place], [place] [address]  Internal MEDICINE  Office Visit Note  Patient Name: Tammy Lam  [id number]  [id number]  Date of Service: 05/11/2021  Chief Complaint  Patient presents with   Results    Review [place] results    HPI Pt is here for routine follow up to review carotid US and bone density -Reviewed bone density scan showing osteopenia and recommended calcium and vit D supplement -Carotid US showed less than 50% stenosis bilaterally with min plaque. Patient is already on crestor and tolerating well. -Had GI visit and states numerous testing was done but has not had visit to review these and determine course of action. -Did not have labs ordered by our office last visit  Current Medication: Outpatient Encounter Medications as of 05/11/2021  Medication Sig   rosuvastatin (CRESTOR) 10 MG tablet Take 1 tablet (10 mg total) by mouth daily.   No facility-administered encounter medications on file as of 05/11/2021.    Surgical History: Past Surgical History:  Procedure Laterality Date   ANKLE SURGERY Right     Medical History: Past Medical History:  Diagnosis Date   Elevated ferritin    Headache    Hemochromatosis    Hyperlipidemia    Hyponatremia    Medical history non-contributory    Shoulder injury     Family History: Family History  Problem Relation Age of Onset   Liver disease Other    Stroke Mother    Breast cancer Neg Hx     Social History   Socioeconomic History   Marital status: Married    Spouse name: Not on file   Number of children: Not on file   Years of education: Not on file   Highest education level: Not on file  Occupational History   Not on file  Tobacco Use   Smoking status: Never   Smokeless tobacco: Never  Vaping Use   Vaping Use: Never used  Substance and Sexual Activity   Alcohol use: No    Alcohol/week: 0.0 standard drinks   Drug use: No   Sexual activity: Yes    Partners: Male  Other  Topics Concern   Not on file  Social History Narrative   Not on file   Social Determinants of Health   Financial Resource Strain: Not on file  Food Insecurity: Not on file  Transportation Needs: Not on file  Physical Activity: Not on file  Stress: Not on file  Social Connections: Not on file  Intimate Partner Violence: Not on file      Review of Systems  Constitutional:  Negative for chills, fatigue and unexpected weight change.  HENT:  Negative for congestion, postnasal drip, rhinorrhea, sneezing and sore throat.   Eyes:  Negative for redness.  Respiratory:  Negative for cough, chest tightness and shortness of breath.   Cardiovascular:  Negative for chest pain and palpitations.  Gastrointestinal:  Negative for abdominal pain, constipation, diarrhea, nausea and vomiting.  Genitourinary:  Negative for dysuria and frequency.  Musculoskeletal:  Positive for arthralgias. Negative for back pain, joint swelling and neck pain.  Skin:  Negative for rash.  Neurological: Negative.  Negative for tremors and numbness.  Hematological:  Negative for adenopathy. Does not bruise/bleed easily.  Psychiatric/Behavioral:  Positive for sleep disturbance. Negative for behavioral problems (Depression) and suicidal ideas. The patient is not nervous/anxious.    Vital Signs: BP 130/73   Pulse 71   Temp 98 F (36.7 C)   Resp 16  Ht 5\' 5"  (1.651 m)   Wt 147 lb 12.8 oz (67 kg)   LMP  (LMP Unknown)   SpO2 95%   BMI 24.60 kg/m    Physical Exam Vitals and nursing note reviewed.  Constitutional:      General: She is not in acute distress.    Appearance: She is well-developed and normal weight. She is not diaphoretic.  HENT:     Head: Normocephalic and atraumatic.     Mouth/Throat:     Pharynx: No oropharyngeal exudate.  Eyes:     Pupils: Pupils are equal, round, and reactive to light.  Neck:     Thyroid: No thyromegaly.     Vascular: No JVD.     Trachea: No tracheal deviation.   Cardiovascular:     Rate and Rhythm: Normal rate and regular rhythm.     Heart sounds: Normal heart sounds. No murmur heard.   No friction rub. No gallop.  Pulmonary:     Effort: Pulmonary effort is normal. No respiratory distress.     Breath sounds: No wheezing or rales.  Chest:     Chest wall: No tenderness.  Abdominal:     General: Bowel sounds are normal.     Palpations: Abdomen is soft.  Musculoskeletal:        General: Normal range of motion.     Cervical back: Normal range of motion and neck supple.  Lymphadenopathy:     Cervical: No cervical adenopathy.  Skin:    General: Skin is warm and dry.  Neurological:     Mental Status: She is alert and oriented to person, place, and time.     Cranial Nerves: No cranial nerve deficit.  Psychiatric:        Behavior: Behavior normal.        Thought Content: Thought content normal.        Judgment: Judgment normal.       Assessment/Plan: 1. Mixed hyperlipidemia Continue crestor and will update labs  2. Hereditary hemochromatosis (HCC) Followed by onc and GI  3. Osteopenia, unspecified location Will supplement Vit D and calcium and continue to monitor   General Counseling: Allaya verbalizes understanding of the findings of todays visit and agrees with plan of treatment. I have discussed any further diagnostic evaluation that may be needed or ordered today. We also reviewed her medications today. she has been encouraged to call the office with any questions or concerns that should arise related to todays visit.    No orders of the defined types were placed in this encounter.   No orders of the defined types were placed in this encounter.   This patient was seen by , PA-C in collaboration with Dr. Lynn Ito as a part of collaborative care agreement.   Total time spent:30 Minutes Time spent includes review of chart, medications, test results, and follow up plan with the patient.      Dr Beverely Risen Internal medicine

## 2021-05-11 NOTE — Telephone Encounter (Signed)
Patient was contacted but spoke to her husband- and he was notified that his wife's ultrasound will have to be cancelled since her insurance [employer] will not cover for it. They agreed and had no further questions.

## 2021-05-14 ENCOUNTER — Ambulatory Visit: Payer: Medicare Other

## 2021-05-19 ENCOUNTER — Other Ambulatory Visit: Payer: Self-pay

## 2021-05-19 DIAGNOSIS — R7989 Other specified abnormal findings of blood chemistry: Secondary | ICD-10-CM

## 2021-05-20 ENCOUNTER — Inpatient Hospital Stay: Payer: Medicare Other | Attending: Oncology

## 2021-05-20 ENCOUNTER — Other Ambulatory Visit: Payer: Medicare Other

## 2021-05-20 ENCOUNTER — Other Ambulatory Visit: Payer: Self-pay

## 2021-05-20 ENCOUNTER — Encounter: Payer: Self-pay | Admitting: Oncology

## 2021-05-20 ENCOUNTER — Inpatient Hospital Stay (HOSPITAL_BASED_OUTPATIENT_CLINIC_OR_DEPARTMENT_OTHER): Payer: Medicare Other | Admitting: Oncology

## 2021-05-20 VITALS — BP 132/81 | HR 66 | Temp 96.9°F | Resp 16 | Wt 147.6 lb

## 2021-05-20 DIAGNOSIS — K760 Fatty (change of) liver, not elsewhere classified: Secondary | ICD-10-CM | POA: Insufficient documentation

## 2021-05-20 DIAGNOSIS — Z887 Allergy status to serum and vaccine status: Secondary | ICD-10-CM | POA: Insufficient documentation

## 2021-05-20 DIAGNOSIS — R5383 Other fatigue: Secondary | ICD-10-CM | POA: Diagnosis not present

## 2021-05-20 DIAGNOSIS — Z823 Family history of stroke: Secondary | ICD-10-CM | POA: Insufficient documentation

## 2021-05-20 DIAGNOSIS — Z148 Genetic carrier of other disease: Secondary | ICD-10-CM | POA: Insufficient documentation

## 2021-05-20 DIAGNOSIS — Z880 Allergy status to penicillin: Secondary | ICD-10-CM | POA: Insufficient documentation

## 2021-05-20 DIAGNOSIS — E785 Hyperlipidemia, unspecified: Secondary | ICD-10-CM | POA: Insufficient documentation

## 2021-05-20 DIAGNOSIS — R7989 Other specified abnormal findings of blood chemistry: Secondary | ICD-10-CM | POA: Insufficient documentation

## 2021-05-20 DIAGNOSIS — Z79899 Other long term (current) drug therapy: Secondary | ICD-10-CM | POA: Insufficient documentation

## 2021-05-20 DIAGNOSIS — Z888 Allergy status to other drugs, medicaments and biological substances status: Secondary | ICD-10-CM | POA: Insufficient documentation

## 2021-05-20 DIAGNOSIS — Z8379 Family history of other diseases of the digestive system: Secondary | ICD-10-CM | POA: Insufficient documentation

## 2021-05-20 LAB — CBC WITH DIFFERENTIAL/PLATELET
Abs Immature Granulocytes: 0.02 10*3/uL (ref 0.00–0.07)
Basophils Absolute: 0 10*3/uL (ref 0.0–0.1)
Basophils Relative: 1 %
Eosinophils Absolute: 0.2 10*3/uL (ref 0.0–0.5)
Eosinophils Relative: 4 %
HCT: 45.2 % (ref 36.0–46.0)
Hemoglobin: 15.8 g/dL — ABNORMAL HIGH (ref 12.0–15.0)
Immature Granulocytes: 0 %
Lymphocytes Relative: 44 %
Lymphs Abs: 2.4 10*3/uL (ref 0.7–4.0)
MCH: 31.2 pg (ref 26.0–34.0)
MCHC: 35 g/dL (ref 30.0–36.0)
MCV: 89.2 fL (ref 80.0–100.0)
Monocytes Absolute: 0.3 10*3/uL (ref 0.1–1.0)
Monocytes Relative: 6 %
Neutro Abs: 2.4 10*3/uL (ref 1.7–7.7)
Neutrophils Relative %: 45 %
Platelets: 162 10*3/uL (ref 150–400)
RBC: 5.07 MIL/uL (ref 3.87–5.11)
RDW: 11.9 % (ref 11.5–15.5)
WBC: 5.4 10*3/uL (ref 4.0–10.5)
nRBC: 0 % (ref 0.0–0.2)

## 2021-05-20 LAB — IRON AND TIBC
Iron: 123 ug/dL (ref 28–170)
Saturation Ratios: 34 % — ABNORMAL HIGH (ref 10.4–31.8)
TIBC: 361 ug/dL (ref 250–450)
UIBC: 238 ug/dL

## 2021-05-20 LAB — COMPREHENSIVE METABOLIC PANEL
ALT: 24 U/L (ref 0–44)
AST: 27 U/L (ref 15–41)
Albumin: 4.3 g/dL (ref 3.5–5.0)
Alkaline Phosphatase: 93 U/L (ref 38–126)
Anion gap: 8 (ref 5–15)
BUN: 13 mg/dL (ref 8–23)
CO2: 26 mmol/L (ref 22–32)
Calcium: 9.3 mg/dL (ref 8.9–10.3)
Chloride: 105 mmol/L (ref 98–111)
Creatinine, Ser: 0.81 mg/dL (ref 0.44–1.00)
GFR, Estimated: >60 mL/min (ref >60)
Glucose, Bld: 101 mg/dL — ABNORMAL HIGH (ref 70–99)
Potassium: 3.8 mmol/L (ref 3.5–5.1)
Sodium: 139 mmol/L (ref 135–145)
Total Bilirubin: 1 mg/dL (ref 0.3–1.2)
Total Protein: 7.3 g/dL (ref 6.5–8.1)

## 2021-05-20 LAB — FERRITIN: Ferritin: 424 ng/mL — ABNORMAL HIGH (ref 11–307)

## 2021-05-20 NOTE — Progress Notes (Signed)
Hematology/Oncology Consult note [facility]  [phone] [phone]  Patient Care Team: Lavera Guise, MD as PCP - General (Internal Medicine)   Name of the patient: Tammy Lam  [id number]  [birth year]   Date of visit: 05/20/21  Diagnosis- 1.  Elevated ferritin 2.  Heterozygosity for H63D  Chief complaint/ Reason for visit-routine follow-up for elevated ferritin  Heme/Onc history: Patient is a 65 year old [place] female.  History obtained with the help of [place] interpreter.  She was diagnosed with heterozygosity for H63D back in April 2017.  She has had mildly elevated ferritin that fluctuates between 400-500 since then.  Iron saturation is mainly remain between 30 to 45% but never higher.  Right upper quadrant ultrasound in June 2020 showed possible fatty infiltration with potential 3.1 cm mass lesion versus area of focal sparing.  She had a liver MRI in July 2020 which showed moderate to severe hepatic steatosis.  Focal fatty sparing in the central left lobe corresponding to the lesion seen on ultrasound.  No evidence of abnormal iron deposition was seen.  Hepatitis B and C serologies were negative in May 2020.  AFP has been within normal limits so far.  She was seen again in April 2022 and underwent 1 session of phlebotomy.  She is a nonalcoholic.  Seen by GI Dr. Vicente Males and undergoing liver work-up so far    Interval history-history obtained with the help of [place] interpreter.  No change in her appetite or weight.  Denies any abdominal pain.  Denies any fevers or recurrent infections.  ECOG PS- 1 Pain scale- 0   Review of systems- Review of Systems  Constitutional:  Positive for malaise/fatigue. Negative for chills, fever and weight loss.  HENT:  Negative for congestion, ear discharge and nosebleeds.   Eyes:  Negative for blurred vision.  Respiratory:  Negative for cough, hemoptysis, sputum production, shortness of breath and  wheezing.   Cardiovascular:  Negative for chest pain, palpitations, orthopnea and claudication.  Gastrointestinal:  Negative for abdominal pain, blood in stool, constipation, diarrhea, heartburn, melena, nausea and vomiting.  Genitourinary:  Negative for dysuria, flank pain, frequency, hematuria and urgency.  Musculoskeletal:  Negative for back pain, joint pain and myalgias.  Skin:  Negative for rash.  Neurological:  Negative for dizziness, tingling, focal weakness, seizures, weakness and headaches.  Endo/Heme/Allergies:  Does not bruise/bleed easily.  Psychiatric/Behavioral:  Negative for depression and suicidal ideas. The patient does not have insomnia.       Allergies  Allergen Reactions   Diphenhydramine Other (See Comments)   Benadryl [Diphenhydramine Hcl] Itching   Influenza Vaccines    Penicillins Itching   Tricor [Fenofibrate]      Past Medical History:  Diagnosis Date   Elevated ferritin    Headache    Hemochromatosis    Hyperlipidemia    Hyponatremia    Medical history non-contributory    Shoulder injury      Past Surgical History:  Procedure Laterality Date   ANKLE SURGERY Right     Social History   Socioeconomic History   Marital status: Married    Spouse name: Not on file   Number of children: Not on file   Years of education: Not on file   Highest education level: Not on file  Occupational History   Not on file  Tobacco Use   Smoking status: Never   Smokeless tobacco: Never  Vaping Use   Vaping Use: Never used  Substance and Sexual  Activity   Alcohol use: No    Alcohol/week: 0.0 standard drinks   Drug use: No   Sexual activity: Yes    Partners: Male  Other Topics Concern   Not on file  Social History Narrative   Not on file   Social Determinants of Health   Financial Resource Strain: Not on file  Food Insecurity: Not on file  Transportation Needs: Not on file  Physical Activity: Not on file  Stress: Not on file  Social Connections:  Not on file  Intimate Partner Violence: Not on file    Family History  Problem Relation Age of Onset   Liver disease Other    Stroke Mother    Breast cancer Neg Hx      Current Outpatient Medications:    rosuvastatin (CRESTOR) 10 MG tablet, Take 1 tablet (10 mg total) by mouth daily., Disp: 90 tablet, Rfl: 3  Physical exam:  Vitals:   05/20/21 0916  BP: 132/81  Pulse: 66  Resp: 16  Temp: (!) 96.9 F (36.1 C)  SpO2: 100%  Weight: 147 lb 9.6 oz (67 kg)   Physical Exam Constitutional:      General: She is not in acute distress. Cardiovascular:     Rate and Rhythm: Normal rate and regular rhythm.     Heart sounds: Normal heart sounds.  Pulmonary:     Effort: Pulmonary effort is normal.     Breath sounds: Normal breath sounds.  Abdominal:     General: Bowel sounds are normal.     Palpations: Abdomen is soft.  Skin:    General: Skin is warm and dry.  Neurological:     Mental Status: She is alert and oriented to person, place, and time.     CMP Latest Ref Rng & Units 05/20/2021  Glucose 70 - 99 mg/dL 101(H)  BUN 8 - 23 mg/dL 13  Creatinine 0.44 - 1.00 mg/dL 0.81  Sodium 135 - 145 mmol/L 139  Potassium 3.5 - 5.1 mmol/L 3.8  Chloride 98 - 111 mmol/L 105  CO2 22 - 32 mmol/L 26  Calcium 8.9 - 10.3 mg/dL 9.3  Total Protein 6.5 - 8.1 g/dL 7.3  Total Bilirubin 0.3 - 1.2 mg/dL 1.0  Alkaline Phos 38 - 126 U/L 93  AST 15 - 41 U/L 27  ALT 0 - 44 U/L 24   CBC Latest Ref Rng & Units 05/20/2021  WBC 4.0 - 10.5 K/uL 5.4  Hemoglobin 12.0 - 15.0 g/dL 15.8(H)  Hematocrit 36.0 - 46.0 % 45.2  Platelets 150 - 400 K/uL 162    No images are attached to the encounter.  US Carotid Duplex Bilateral  Result Date: 04/22/2021 Allyne Gee, MD     05/09/2021 11:23 AM [place], [place] [address] DATE OF SERVICE: April 22, 2021 CAROTID DOPPLER INTERPRETATION: Bilateral Carotid Ultrsasound and Color Doppler Examination was performed. The RIGHT CCA  shows no significant plaque in the vessel. The LEFT CCA shows no significant plaque in the vessel. There was no significant intimal thickening noted in the RIGHT carotid artery. There was no significant intimal thickening in the LEFT carotid artery. The RIGHT CCA shows peak systolic velocity of 84 cm per second. The end diastolic velocity is 25 cm per second on the RIGHT side. The RIGHT ICA shows peak systolic velocity of 89 per second. RIGHT sided ICA end diastolic velocity is 39 cm per second. The RIGHT ECA shows a peak systolic velocity of 209 cm per  second. The ICA/CCA ratio is calculated to be 1.06. This suggests less than 50% stenosis. The Vertebral Artery shows antegrade flow. The LEFT CCA shows peak systolic velocity of 81 cm per second. The end diastolic velocity is 29 cm per second on the LEFT side. The LEFT ICA shows peak systolic velocity of 84 per second. LEFT sided ICA end diastolic velocity is 31 cm per second. The LEFT ECA shows a peak systolic velocity of 85 cm per second. The ICA/CCA ratio is calculated to be 1.03. This suggests less than 50% stenosis. The Vertebral Artery shows antegrade flow. Impression:  The RIGHT CAROTID shows less than 50% stenosis. The LEFT CAROTID shows less than 50% stenosis.  There is no significant plaque formation noted on the LEFT and no significant plaque on the RIGHT  side. Consider a repeat Carotid doppler if clinical situation and symptoms warrant in 6-12 months. Patient should be encouraged to change lifestyles such as smoking cessation, regular exercise and dietary modification. Use of statins in the right clinical setting and ASA is encouraged. Allyne Gee, MD [facility] Pulmonary Critical Care Medicine   DG Bone Density  Result Date: 05/04/2021 EXAM: DUAL X-RAY ABSORPTIOMETRY (DXA) FOR BONE MINERAL DENSITY IMPRESSION: Dear Dr. Chauncey Cruel, Your patient Kalifa Cadden completed a FRAX assessment on 05/04/2021 using the [place] (analysis version: 14.10)  manufactured by [employer]. The following summarizes the results of our evaluation. PATIENT BIOGRAPHICAL: Name: Maggi, Hershkowitz Patient ID: [id number] Birth Date: 01-26-1956 Height:    63.0 in. Gender:     Female    [age]       Weight:    147.0 lbs. Ethnicity:  Asian                            Exam Date: 05/04/2021 FRAX* RESULTS:  (version: 3.5) 10-year Probability of Fracture1 Major Osteoporotic Fracture2 Hip Fracture 3.7% 0.1% Population: [place] (Asian) Risk Factors: None Based on Femur (Right) Neck BMD 1 -The 10-year probability of fracture may be lower than reported if the patient has received treatment. 2 -Major Osteoporotic Fracture: Clinical Spine, Forearm, Hip or Shoulder *FRAX is a Materials engineer of the [employer] for Metabolic Bone Disease, a [place] (WHO) [facility]. ASSESSMENT: The probability of a major osteoporotic fracture is 3.7% within the next ten years. The probability of a hip fracture is 0.1% within the next ten years. . Your patient Azyriah Nevins completed a BMD test on 05/04/2021 using the [place] (software version: 14.10) manufactured by [employer]. The following summarizes the results of our evaluation. Technologist: [facility] PATIENT BIOGRAPHICAL: Name: Anjeanette, Petzold Patient ID: [id number] Birth Date: December 11, 1955 Height: 63.0 in. Gender: Female Exam Date: 05/04/2021 Weight: 147.0 lbs. Indications: Postmenopausal Fractures:             Treatments: DENSITOMETRY RESULTS: Site      Region     Measured Date Measured Age WHO Classification Young Adult T-score BMD         %Change vs. Previous Significant Change (*) AP Spine L1-L2 05/04/2021 65.4 Osteopenia -2.0 0.930 g/cm2 DualFemur Neck Right 05/04/2021 65.4 Normal -0.1 1.018 g/cm2 ASSESSMENT: The BMD measured at AP Spine L1-L2 is 0.930 g/cm2 with a T-score of -2.0. This patient is considered osteopenic according to [facility]) criteria. The scan  quality is good. L-3 & 4was excluded due to degenerative  changes. World Pharmacologist [facility]) criteria for post-menopausal, Caucasian Women: Normal:                   T-score at or above -1 SD Osteopenia/low bone mass: T-score between -1 and -2.5 SD Osteoporosis:             T-score at or below -2.5 SD RECOMMENDATIONS: 1. All patients should optimize calcium and vitamin D intake. 2. Consider FDA-approved medical therapies in postmenopausal women and men aged 105 years and older, based on the following: a. A hip or vertebral(clinical or morphometric) fracture b. T-score < -2.5 at the femoral neck or spine after appropriate evaluation to exclude secondary causes c. Low bone mass (T-score between -1.0 and -2.5 at the femoral neck or spine) and a 10-year probability of a hip fracture > 3% or a 10-year probability of a major osteoporosis-related fracture > 20% based on the [place]-adapted WHO algorithm 3. Clinician judgment and/or patient preferences may indicate treatment for people with 10-year fracture probabilities above or below these levels FOLLOW-UP: People with diagnosed cases of osteoporosis or at high risk for fracture should have regular bone mineral density tests. For patients eligible for Medicare, routine testing is allowed once every 2 years. The testing frequency can be increased to one year for patients who have rapidly progressing disease, those who are receiving or discontinuing medical therapy to restore bone mass, or have additional risk factors. I have reviewed this report, and agree with the above findings. [facility] Electronically Signed   By: Rolm Baptise M.D.   On: 05/04/2021 11:38     Assessment and plan- Patient is a 65 y.o. female with elevated ferritin and heterozygosity for H63D  Heterozygosity for H63D typically does not lead to iron overload.Patient had MRI liver back in April 2020 which did not show any evidence of iron deposition in the liver but did show moderate to  severe hepatic steatosis.  This has been subsequently substantiated in a repeat ultrasound as well.  Her LFTs are normal and labs are not suggestive of cirrhosis either.  ESR and CRP are normal.  Ferritin levels have been consistently elevated and on 05/06/2021 it was 698 as compared to 424 a year ago.  Iron saturation is 38%.  It is unclear if the ferritin is elevated due to underlying severe fatty liver (with normal LFTs) versus elevated ferritin as an acute phase reactant.  However patient does not have any other known acute medical conditions which would justify the elevated ferritin.  Her autoimmune liver work-up so far has been unremarkable except for positive ANA.  Patient does not have any baseline skin rash joint pain or joint swelling, renal dysfunction to suggest rheumatology coag disorder that would lead to elevated ferritin.  At this time I will await input from GI to see if a liver biopsy would be warranted.  Patient states that GI had ordered a test for her which I am thinking is probably elastography and states that this was not covered by her insurance.  I will continue to monitor her ferritin levels for now without a phlebotomy at this time  CBC with differential, CMP, ferritin in 3 in 6 months and I will see her in 6 months   Visit Diagnosis 1. Elevated ferritin      Dr. Randa Evens, MD, MPH [facility] [phone] 05/20/2021 12:06 PM

## 2021-05-21 LAB — AFP TUMOR MARKER: AFP, Serum, Tumor Marker: 5.4 ng/mL (ref 0.0–9.2)

## 2021-05-31 IMAGING — US ULTRASOUND ABDOMEN LIMITED
1 series · 13 of 25 positions shown · non-contrast
Comparison: None

CLINICAL DATA: Hereditary hemochromatosis

EXAM:
ULTRASOUND ABDOMEN LIMITED RIGHT UPPER QUADRANT

[Series 1: ultrasound abdomen limited · 0.19mm/px · 13 of 60 slices shown]
[im 1/60]
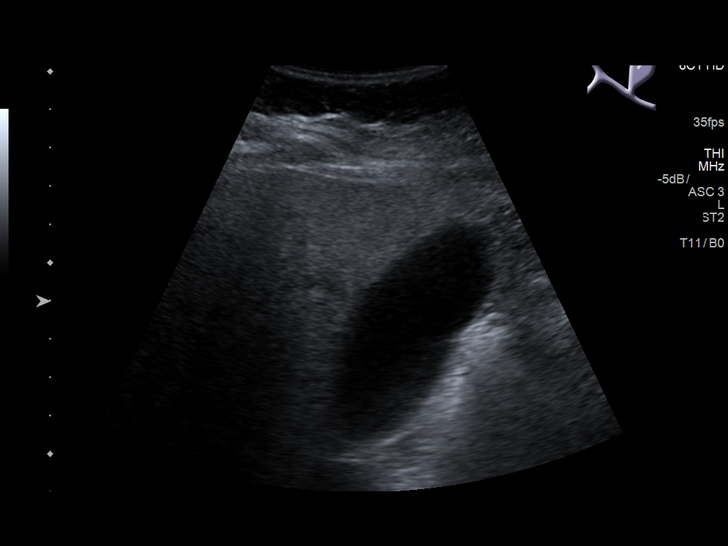
[im 5/60]
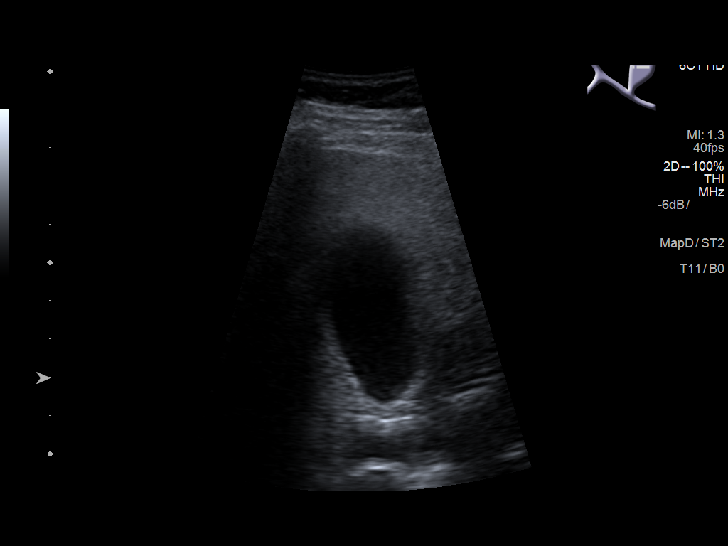
[im 10/60]
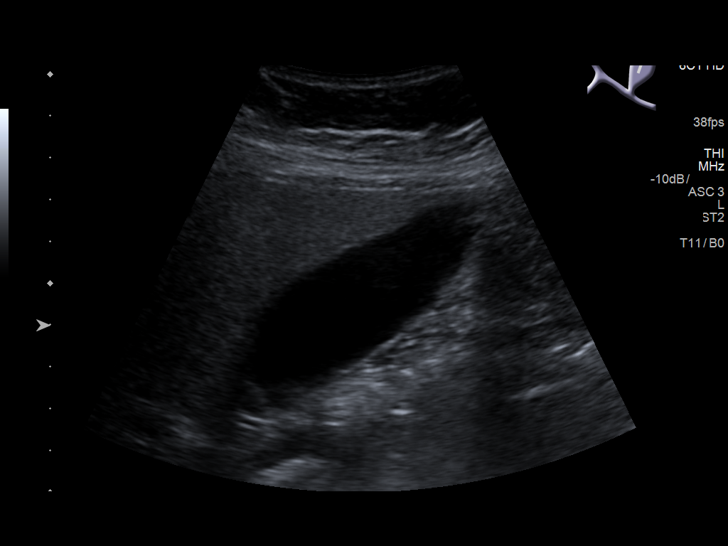
[im 15/60]
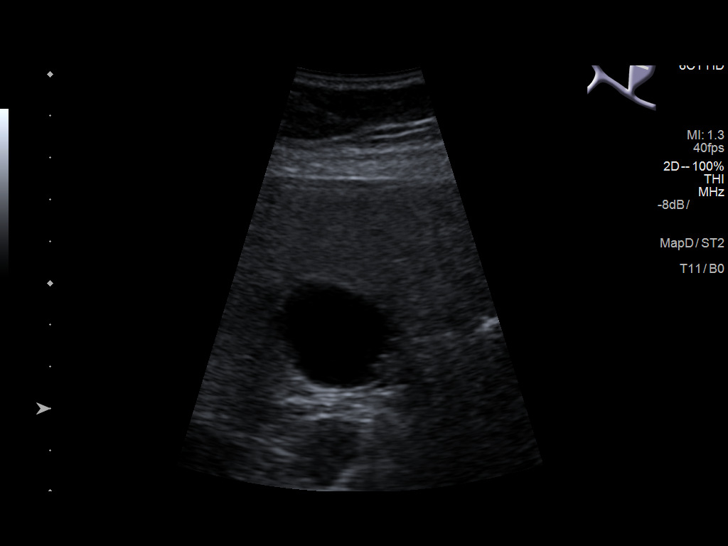
[im 20/60]
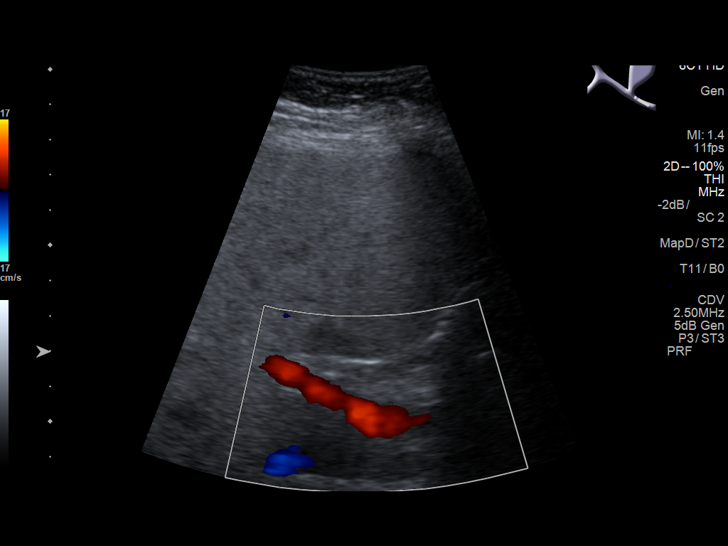
[im 25/60]
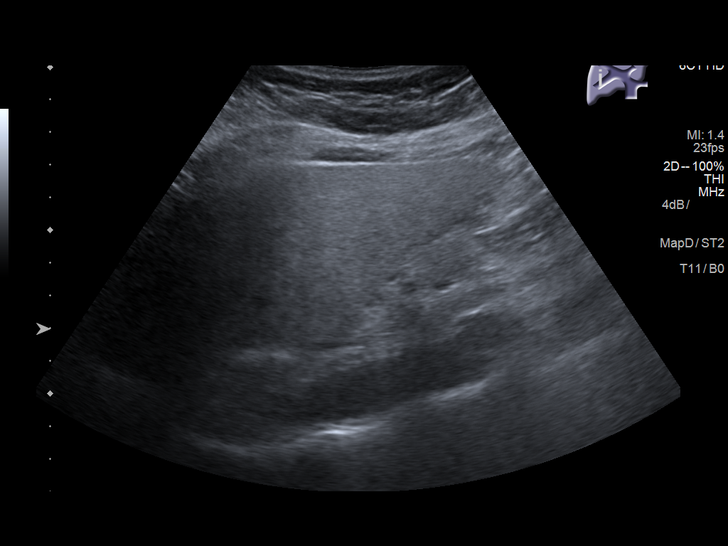
[im 30/60]
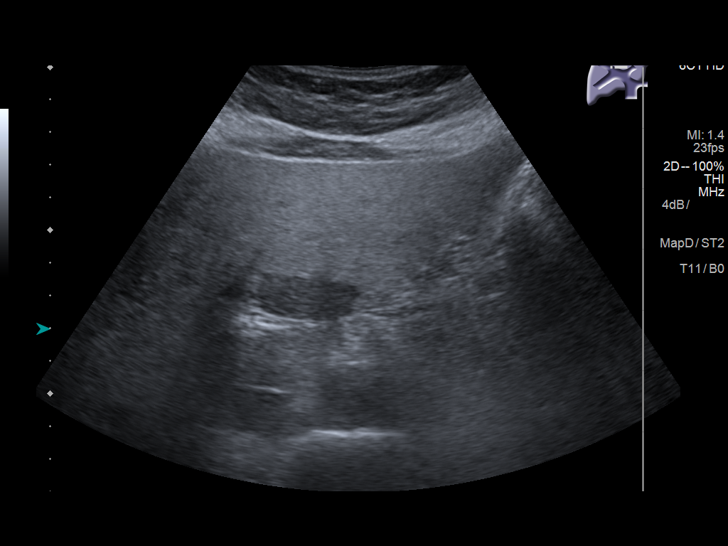
[im 35/60]
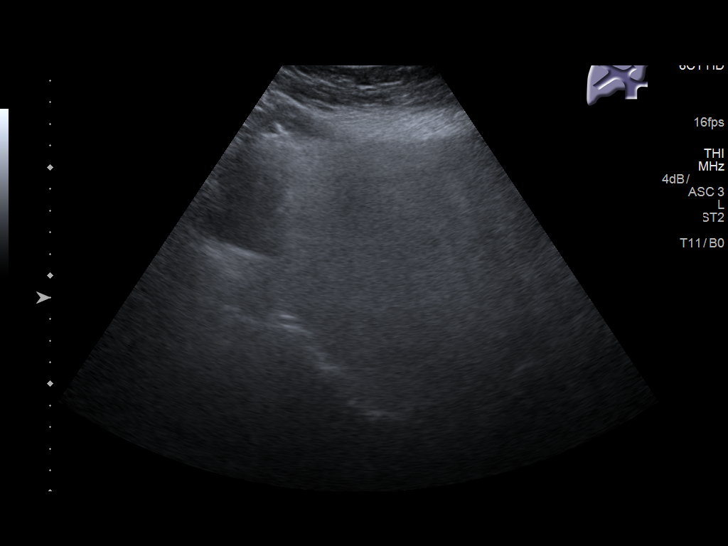
[im 40/60]
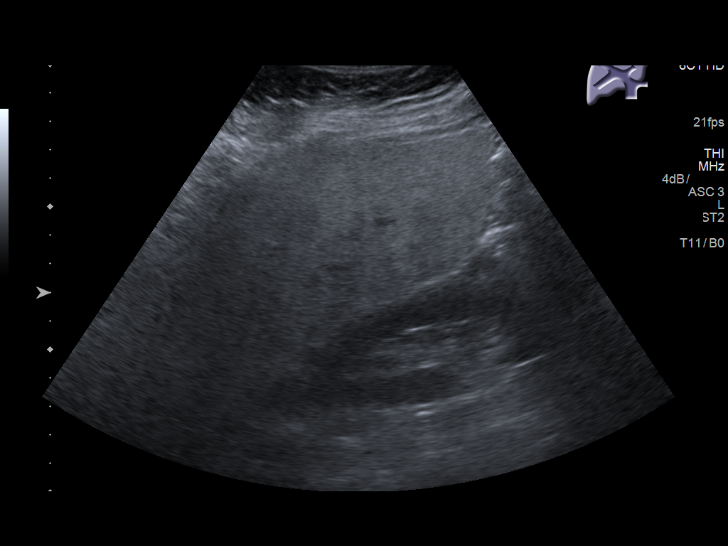
[im 45/60]
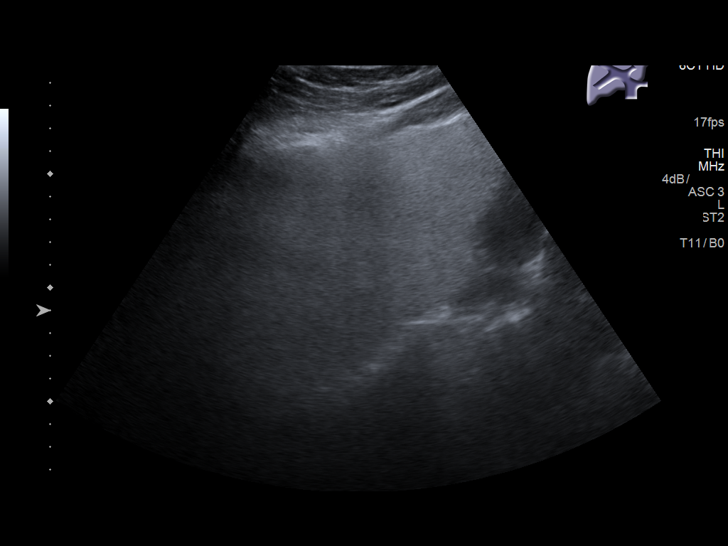
[im 50/60]
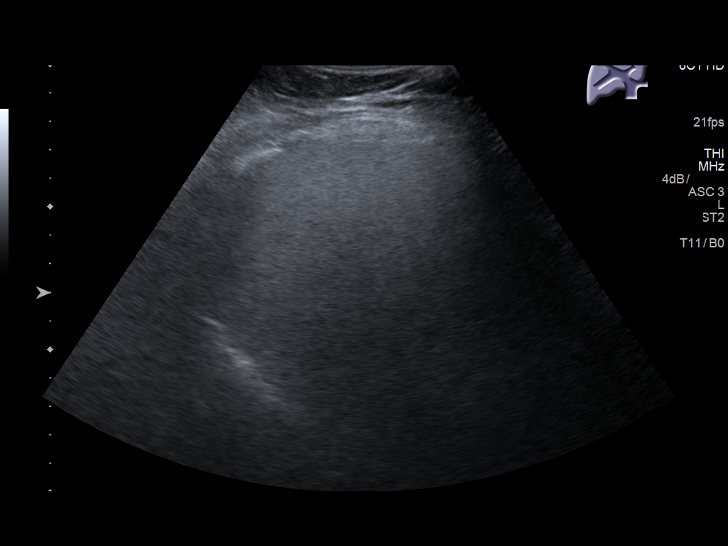
[im 55/60]
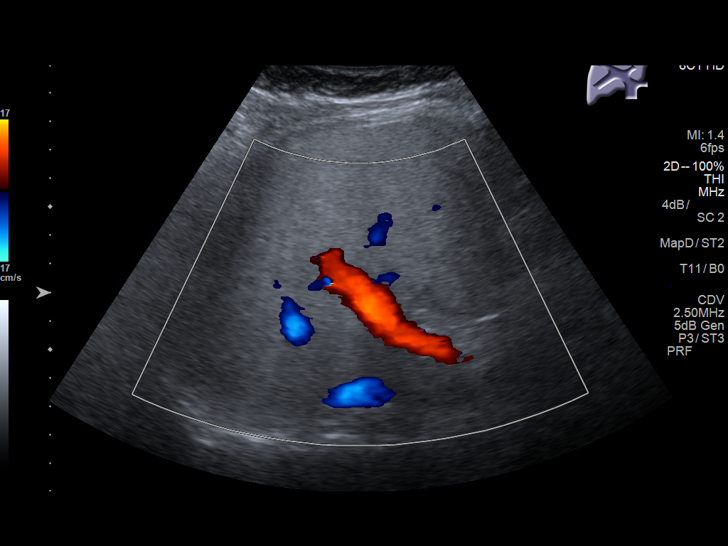
[im 60/60]
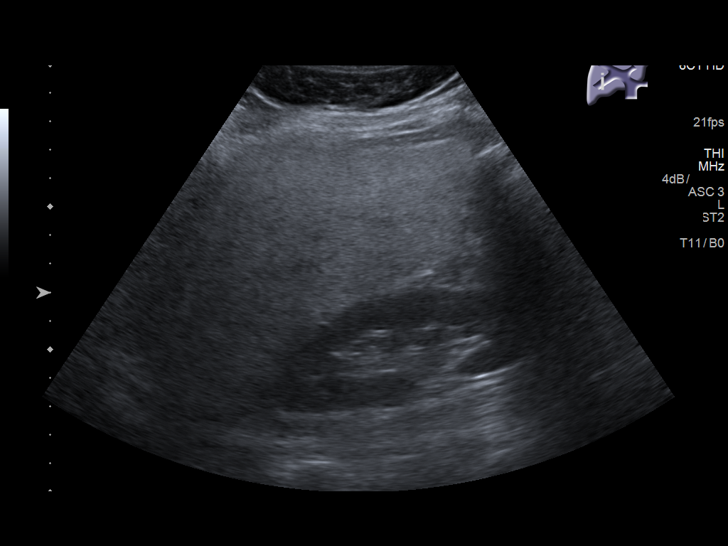

[13 of 25 positions shown; findings below may reference images not displayed]

FINDINGS: Gallbladder:

Minimal sludge within gallbladder. No gallbladder wall thickening,
pericholecystic fluid or sonographic Murphy sign. No shadowing
calculi.

Common bile duct:

Diameter: 4 mm diameter, normal

Liver:

Increased hepatic echogenicity, can be seen with fatty infiltration
and cirrhosis as well as certain infiltrative disorders. Surface
contour appears smooth. Area of hypoechogenicity adjacent to the
porta hepatis is identified, 3.1 x 1.5 x 1.6 cm, could represent a
true liver lesion or an area of focal sparing, mass/neoplasm not
excluded. No additional masses are identified, though assessment of
intrahepatic detail is suboptimal due to sound attenuation. Portal
vein is patent on color Doppler imaging with normal direction of
blood flow towards the liver.

No RIGHT upper quadrant free fluid.
IMPRESSION: Minimal sludge within gallbladder.

Question fatty infiltration of liver as above with potential 3.1 cm
diameter mass lesion versus area of focal sparing; further
evaluation of the liver by MR imaging with and without contrast
recommended to exclude hepatic mass/malignancy.

These results will be called to the ordering clinician or
representative by the Radiologist Assistant, and communication
documented in the PACS or zVision Dashboard.

## 2021-07-08 ENCOUNTER — Telehealth: Payer: Self-pay

## 2021-07-08 ENCOUNTER — Ambulatory Visit: Payer: Medicare Other | Admitting: Gastroenterology

## 2021-07-08 DIAGNOSIS — R7989 Other specified abnormal findings of blood chemistry: Secondary | ICD-10-CM

## 2021-07-08 NOTE — Telephone Encounter (Signed)
-----   Message from Wyline Mood, MD sent at 07/06/2021  1:50 PM EST ----- Inform that her ferritin is elevated let us check HFE gene mutation.  Hepatitis B test is abnormal indicating that she is presently immune to hepatitis B but has possibly had an infection in the past.  Let us check a hepatitis B viral load to be sure.

## 2021-07-08 NOTE — Telephone Encounter (Signed)
Called patient and left her a detailed message letting her know that her Ferritin is elevated and and that Dr. Tobi Bastos would want for her to have blood drawn. I informed her that the labs were ordered and that she could come to our office or at [facility] to have it drawn.

## 2021-07-13 ENCOUNTER — Ambulatory Visit: Payer: Self-pay | Admitting: Physician Assistant

## 2021-07-30 LAB — CBC WITH DIFFERENTIAL/PLATELET
Basophils Absolute: 0 10*3/uL (ref 0.0–0.2)
Basos: 1 %
EOS (ABSOLUTE): 0.1 10*3/uL (ref 0.0–0.4)
Eos: 3 %
Hematocrit: 43.3 % (ref 34.0–46.6)
Hemoglobin: 14.7 g/dL (ref 11.1–15.9)
Immature Grans (Abs): 0 10*3/uL (ref 0.0–0.1)
Immature Granulocytes: 1 %
Lymphocytes Absolute: 2.3 10*3/uL (ref 0.7–3.1)
Lymphs: 55 %
MCH: 31.3 pg (ref 26.6–33.0)
MCHC: 33.9 g/dL (ref 31.5–35.7)
MCV: 92 fL (ref 79–97)
Monocytes Absolute: 0.3 10*3/uL (ref 0.1–0.9)
Monocytes: 7 %
Neutrophils Absolute: 1.4 10*3/uL (ref 1.4–7.0)
Neutrophils: 33 %
Platelets: 144 10*3/uL — ABNORMAL LOW (ref 150–450)
RBC: 4.7 x10E6/uL (ref 3.77–5.28)
RDW: 12.6 % (ref 11.7–15.4)
WBC: 4.2 10*3/uL (ref 3.4–10.8)

## 2021-07-30 LAB — TSH+FREE T4
Free T4: 1.31 ng/dL (ref 0.82–1.77)
TSH: 4.18 u[IU]/mL (ref 0.450–4.500)

## 2021-07-30 LAB — LIPID PANEL WITH LDL/HDL RATIO
Cholesterol, Total: 145 mg/dL (ref 100–199)
HDL: 41 mg/dL (ref >39)
LDL Chol Calc (NIH): 74 mg/dL (ref 0–99)
LDL/HDL Ratio: 1.8 ratio (ref 0.0–3.2)
Triglycerides: 175 mg/dL — ABNORMAL HIGH (ref 0–149)
VLDL Cholesterol Cal: 30 mg/dL (ref 5–40)

## 2021-07-30 LAB — COMPREHENSIVE METABOLIC PANEL
ALT: 47 IU/L — ABNORMAL HIGH (ref 0–32)
AST: 50 IU/L — ABNORMAL HIGH (ref 0–40)
Albumin/Globulin Ratio: 1.7 (ref 1.2–2.2)
Albumin: 4 g/dL (ref 3.8–4.8)
Alkaline Phosphatase: 93 IU/L (ref 44–121)
BUN/Creatinine Ratio: 13 (ref 12–28)
BUN: 11 mg/dL (ref 8–27)
Bilirubin Total: 0.7 mg/dL (ref 0.0–1.2)
CO2: 25 mmol/L (ref 20–29)
Calcium: 8.8 mg/dL (ref 8.7–10.3)
Chloride: 103 mmol/L (ref 96–106)
Creatinine, Ser: 0.85 mg/dL (ref 0.57–1.00)
Globulin, Total: 2.4 g/dL (ref 1.5–4.5)
Glucose: 105 mg/dL — ABNORMAL HIGH (ref 70–99)
Potassium: 3.9 mmol/L (ref 3.5–5.2)
Sodium: 141 mmol/L (ref 134–144)
Total Protein: 6.4 g/dL (ref 6.0–8.5)
eGFR: 76 mL/min/{1.73_m2} (ref >59)

## 2021-07-30 LAB — B12 AND FOLATE PANEL
Folate: 19.9 ng/mL (ref >3.0)
Vitamin B-12: 656 pg/mL (ref 232–1245)

## 2021-07-30 LAB — VITAMIN D 25 HYDROXY (VIT D DEFICIENCY, FRACTURES): Vit D, 25-Hydroxy: 33.3 ng/mL (ref 30.0–100.0)

## 2021-08-03 ENCOUNTER — Encounter: Payer: Self-pay | Admitting: Physician Assistant

## 2021-08-03 ENCOUNTER — Telehealth (INDEPENDENT_AMBULATORY_CARE_PROVIDER_SITE_OTHER): Payer: Medicare Other | Admitting: Physician Assistant

## 2021-08-03 VITALS — Ht 65.0 in | Wt 147.0 lb

## 2021-08-03 DIAGNOSIS — U071 COVID-19: Secondary | ICD-10-CM | POA: Diagnosis not present

## 2021-08-03 NOTE — Progress Notes (Signed)
[facility] [address] [place], [place] [address]  Internal MEDICINE  Telephone Visit  Patient Name: Tammy Lam  [id number]  [id number]  Date of Service: 08/09/2021  I connected with the patient at 12:16 by telephone and verified the patients identity using two identifiers.   I discussed the limitations, risks, security and privacy concerns of performing an evaluation and management service by telephone and the availability of in person appointments. I also discussed with the patient that there may be a patient responsible charge related to the service.  The patient expressed understanding and agrees to proceed.    Chief Complaint  Patient presents with   Telephone Assessment    Covid positive    Telephone Screen    [phone]   Sinusitis   Cough    HPI Pt is here for virtual sick visit -Her husband was diagnosed with covid last week and she started having symptoms and tested and is also covid positive. -Cough starting dec 5th and tested positive at home yesterday. No SOB or wheezing. -Took nyquil/dayquil. -Covid vaccines 4 rounds -Discussed since she is feeling better and it has already been a week since symptoms started that no prescription is needed and may continue OTC meds  Current Medication: Outpatient Encounter Medications as of 08/03/2021  Medication Sig   rosuvastatin (CRESTOR) 10 MG tablet Take 1 tablet (10 mg total) by mouth daily.   No facility-administered encounter medications on file as of 08/03/2021.    Surgical History: Past Surgical History:  Procedure Laterality Date   ANKLE SURGERY Right     Medical History: Past Medical History:  Diagnosis Date   Elevated ferritin    Headache    Hemochromatosis    Hyperlipidemia    Hyponatremia    Medical history non-contributory    Shoulder injury     Family History: Family History  Problem Relation Age of Onset   Liver disease Other    Stroke Mother    Breast cancer Neg Hx      Social History   Socioeconomic History   Marital status: Married    Spouse name: Not on file   Number of children: Not on file   Years of education: Not on file   Highest education level: Not on file  Occupational History   Not on file  Tobacco Use   Smoking status: Never   Smokeless tobacco: Never  Vaping Use   Vaping Use: Never used  Substance and Sexual Activity   Alcohol use: No    Alcohol/week: 0.0 standard drinks   Drug use: No   Sexual activity: Yes    Partners: Male  Other Topics Concern   Not on file  Social History Narrative   Not on file   Social Determinants of Health   Financial Resource Strain: Not on file  Food Insecurity: Not on file  Transportation Needs: Not on file  Physical Activity: Not on file  Stress: Not on file  Social Connections: Not on file  Intimate Partner Violence: Not on file      Review of Systems  Constitutional:  Negative for fatigue and fever.  HENT:  Positive for congestion and postnasal drip. Negative for mouth sores.   Respiratory:  Positive for cough. Negative for shortness of breath and wheezing.   Cardiovascular:  Negative for chest pain.  Genitourinary:  Negative for flank pain.  Psychiatric/Behavioral: Negative.     Vital Signs: Ht 5\' 5"  (1.651 m)   Wt 147 lb (66.7 kg)  LMP  (LMP Unknown)   BMI 24.46 kg/m    Observation/Objective:  Pt is able to carry out conversation   Assessment/Plan: 1. Acute COVID-19 Given timeline and improvement in symptoms will continue with OTC meds as needed such as mucinex or dayquil/nyquil. Advised to rest and stay hydrated   General Counseling: Tammy Lam verbalizes understanding of the findings of today's phone visit and agrees with plan of treatment. I have discussed any further diagnostic evaluation that may be needed or ordered today. We also reviewed her medications today. she has been encouraged to call the office with any questions or concerns that should arise related to  todays visit.    No orders of the defined types were placed in this encounter.   No orders of the defined types were placed in this encounter.   Time spent:25 Minutes    Dr Lyndon Code Internal medicine

## 2021-08-05 ENCOUNTER — Ambulatory Visit: Payer: Medicare Other | Admitting: Gastroenterology

## 2021-08-10 ENCOUNTER — Ambulatory Visit (INDEPENDENT_AMBULATORY_CARE_PROVIDER_SITE_OTHER): Payer: Medicare Other | Admitting: Physician Assistant

## 2021-08-10 ENCOUNTER — Other Ambulatory Visit: Payer: Self-pay

## 2021-08-10 ENCOUNTER — Encounter: Payer: Self-pay | Admitting: Physician Assistant

## 2021-08-10 ENCOUNTER — Telehealth: Payer: Medicare Other | Admitting: Physician Assistant

## 2021-08-10 VITALS — BP 125/70 | HR 81 | Temp 98.4°F | Resp 16 | Ht 65.0 in | Wt 145.6 lb

## 2021-08-10 DIAGNOSIS — Z0001 Encounter for general adult medical examination with abnormal findings: Secondary | ICD-10-CM

## 2021-08-10 DIAGNOSIS — D696 Thrombocytopenia, unspecified: Secondary | ICD-10-CM

## 2021-08-10 DIAGNOSIS — Z1231 Encounter for screening mammogram for malignant neoplasm of breast: Secondary | ICD-10-CM | POA: Diagnosis not present

## 2021-08-10 DIAGNOSIS — E782 Mixed hyperlipidemia: Secondary | ICD-10-CM

## 2021-08-10 DIAGNOSIS — Z01419 Encounter for gynecological examination (general) (routine) without abnormal findings: Secondary | ICD-10-CM | POA: Diagnosis not present

## 2021-08-10 DIAGNOSIS — K760 Fatty (change of) liver, not elsewhere classified: Secondary | ICD-10-CM | POA: Diagnosis not present

## 2021-08-10 DIAGNOSIS — R300 Dysuria: Secondary | ICD-10-CM

## 2021-08-10 NOTE — Progress Notes (Signed)
[facility] [place], [place] [address]  Internal MEDICINE  Office Visit Note  Patient Name: Tammy Lam  [id number]  [id number]  Date of Service: 08/18/2021  Chief Complaint  Patient presents with   Medicare Wellness   Hyperlipidemia   Quality Metric Gaps    Pneumonia and Shingrix vaccine     HPI Pt is here for routine health maintenance examination with her husband -She has recovered from covid for the most part. She did not require prescription treatment. Does have a slight cough remaining, but otherwise feels well -Labs reviewed and cholesterol is improved, elevated liver enzymes--Followed by GI. Also has mildly low platelets and already sees heme/onc for elevated ferritin with plans for updated labs later this month and again in a few months. Will defer ordering follow up labs at this time since she will be having these via heme/onc already -Takes crestor daily and tolerating well -Sleeping ok -No due for mammogram until march, cologuard done this year  Current Medication: Outpatient Encounter Medications as of 08/10/2021  Medication Sig   rosuvastatin (CRESTOR) 10 MG tablet Take 1 tablet (10 mg total) by mouth daily.   No facility-administered encounter medications on file as of 08/10/2021.    Surgical History: Past Surgical History:  Procedure Laterality Date   ANKLE SURGERY Right     Medical History: Past Medical History:  Diagnosis Date   Elevated ferritin    Headache    Hemochromatosis    Hyperlipidemia    Hyponatremia    Medical history non-contributory    Shoulder injury     Family History: Family History  Problem Relation Age of Onset   Liver disease Other    Stroke Mother    Breast cancer Neg Hx       Review of Systems  Constitutional:  Negative for chills, fatigue and unexpected weight change.  HENT:  Negative for congestion, rhinorrhea, sneezing and sore throat.   Eyes:  Negative for redness.  Respiratory:   Positive for cough. Negative for chest tightness, shortness of breath and wheezing.   Cardiovascular:  Negative for chest pain and palpitations.  Gastrointestinal:  Negative for abdominal pain, constipation, diarrhea, nausea and vomiting.  Genitourinary:  Negative for dysuria and frequency.  Musculoskeletal:  Negative for arthralgias, back pain, joint swelling and neck pain.  Skin:  Negative for rash.  Neurological: Negative.  Negative for tremors and numbness.  Hematological:  Negative for adenopathy. Does not bruise/bleed easily.  Psychiatric/Behavioral:  Negative for behavioral problems (Depression), sleep disturbance and suicidal ideas. The patient is not nervous/anxious.     Vital Signs: BP 125/70    Pulse 81    Temp 98.4 F (36.9 C)    Resp 16    Ht 5' 5" (1.651 m)    Wt 145 lb 9.6 oz (66 kg)    LMP  (LMP Unknown)    SpO2 97%    BMI 24.23 kg/m    Physical Exam Vitals and nursing note reviewed.  Constitutional:      General: She is not in acute distress.    Appearance: She is well-developed and normal weight. She is not diaphoretic.  HENT:     Head: Normocephalic and atraumatic.     Right Ear: External ear normal.     Left Ear: External ear normal.     Nose: Nose normal.     Mouth/Throat:     Pharynx: No oropharyngeal exudate.  Eyes:     General: No scleral icterus.  Right eye: No discharge.        Left eye: No discharge.     Conjunctiva/sclera: Conjunctivae normal.     Pupils: Pupils are equal, round, and reactive to light.  Neck:     Thyroid: No thyromegaly.     Vascular: No JVD.     Trachea: No tracheal deviation.  Cardiovascular:     Rate and Rhythm: Normal rate and regular rhythm.     Heart sounds: Normal heart sounds. No murmur heard.   No friction rub. No gallop.  Pulmonary:     Effort: Pulmonary effort is normal. No respiratory distress.     Breath sounds: Normal breath sounds. No stridor. No wheezing or rales.  Chest:     Chest wall: No tenderness.   Abdominal:     General: Bowel sounds are normal. There is no distension.     Palpations: Abdomen is soft. There is no mass.     Tenderness: There is no abdominal tenderness. There is no guarding or rebound.  Musculoskeletal:        General: No tenderness or deformity. Normal range of motion.     Cervical back: Normal range of motion and neck supple.  Lymphadenopathy:     Cervical: No cervical adenopathy.  Skin:    General: Skin is warm and dry.     Coloration: Skin is not pale.     Findings: No erythema or rash.  Neurological:     Mental Status: She is alert.     Cranial Nerves: No cranial nerve deficit.     Motor: No abnormal muscle tone.     Coordination: Coordination normal.     Deep Tendon Reflexes: Reflexes are normal and symmetric.  Psychiatric:        Behavior: Behavior normal.        Thought Content: Thought content normal.        Judgment: Judgment normal.     LABS: Recent Results (from the past 2160 hour(s))  CBC w/Diff/Platelet     Status: Abnormal   Collection Time: 07/29/21  9:31 AM  Result Value Ref Range   WBC 4.2 3.4 - 10.8 x10E3/uL   RBC 4.70 3.77 - 5.28 x10E6/uL   Hemoglobin 14.7 11.1 - 15.9 g/dL   Hematocrit 43.3 34.0 - 46.6 %   MCV 92 79 - 97 fL   MCH 31.3 26.6 - 33.0 pg   MCHC 33.9 31.5 - 35.7 g/dL   RDW 12.6 11.7 - 15.4 %   Platelets 144 (L) 150 - 450 x10E3/uL   Neutrophils 33 Not Estab. %   Lymphs 55 Not Estab. %   Monocytes 7 Not Estab. %   Eos 3 Not Estab. %   Basos 1 Not Estab. %   Neutrophils Absolute 1.4 1.4 - 7.0 x10E3/uL   Lymphocytes Absolute 2.3 0.7 - 3.1 x10E3/uL   Monocytes Absolute 0.3 0.1 - 0.9 x10E3/uL   EOS (ABSOLUTE) 0.1 0.0 - 0.4 x10E3/uL   Basophils Absolute 0.0 0.0 - 0.2 x10E3/uL   Immature Granulocytes 1 Not Estab. %   Immature Grans (Abs) 0.0 0.0 - 0.1 x10E3/uL  TSH + free T4     Status: None   Collection Time: 07/29/21  9:31 AM  Result Value Ref Range   TSH 4.180 0.450 - 4.500 uIU/mL   Free T4 1.31 0.82 - 1.77  ng/dL  Lipid Panel With LDL/HDL Ratio     Status: Abnormal   Collection Time: 07/29/21  9:31 AM  Result Value Ref Range  Cholesterol, Total 145 100 - 199 mg/dL   Triglycerides 175 (H) 0 - 149 mg/dL   HDL 41 >39 mg/dL   VLDL Cholesterol Cal 30 5 - 40 mg/dL   LDL Chol Calc (NIH) 74 0 - 99 mg/dL   LDL/HDL Ratio 1.8 0.0 - 3.2 ratio    Comment:                                     LDL/HDL Ratio                                             Men  Women                               1/2 Avg.Risk  1.0    1.5                                   Avg.Risk  3.6    3.2                                2X Avg.Risk  6.2    5.0                                3X Avg.Risk  8.0    6.1   Comprehensive metabolic panel     Status: Abnormal   Collection Time: 07/29/21  9:31 AM  Result Value Ref Range   Glucose 105 (H) 70 - 99 mg/dL   BUN 11 8 - 27 mg/dL   Creatinine, Ser 0.85 0.57 - 1.00 mg/dL   eGFR 76 >59 mL/min/1.73   BUN/Creatinine Ratio 13 12 - 28   Sodium 141 134 - 144 mmol/L   Potassium 3.9 3.5 - 5.2 mmol/L   Chloride 103 96 - 106 mmol/L   CO2 25 20 - 29 mmol/L   Calcium 8.8 8.7 - 10.3 mg/dL   Total Protein 6.4 6.0 - 8.5 g/dL   Albumin 4.0 3.8 - 4.8 g/dL   Globulin, Total 2.4 1.5 - 4.5 g/dL   Albumin/Globulin Ratio 1.7 1.2 - 2.2   Bilirubin Total 0.7 0.0 - 1.2 mg/dL   Alkaline Phosphatase 93 44 - 121 IU/L   AST 50 (H) 0 - 40 IU/L   ALT 47 (H) 0 - 32 IU/L  VITAMIN D 25 Hydroxy (Vit-D Deficiency, Fractures)     Status: None   Collection Time: 07/29/21  9:31 AM  Result Value Ref Range   Vit D, 25-Hydroxy 33.3 30.0 - 100.0 ng/mL    Comment: Vitamin D deficiency has been defined by the Institute of Medicine and an Endocrine Society practice guideline as a level of serum 25-OH vitamin D less than 20 ng/mL (1,2). The Endocrine Society went on to further define vitamin D insufficiency as a level between 21 and 29 ng/mL (2). 1. IOM (Institute of Medicine). 2010. Dietary reference    intakes for  calcium and D. [place]: The    [employer]. 2. Holick MF, Binkley Warren, Bischoff-Ferrari HA, et al.  Evaluation, treatment, and prevention of vitamin D    deficiency: an Endocrine Society clinical practice    guideline. JCEM. 2011 Jul; 96(7):1911-30.   B12 and Folate Panel     Status: None   Collection Time: 07/29/21  9:31 AM  Result Value Ref Range   Vitamin B-12 656 232 - 1,245 pg/mL   Folate 19.9 >3.0 ng/mL    Comment: A serum folate concentration of less than 3.1 ng/mL is considered to represent clinical deficiency.   UA/M w/rflx Culture, Routine     Status: Abnormal   Collection Time: 08/10/21  1:10 PM   Specimen: Urine   Urine  Result Value Ref Range   Specific Gravity, UA 1.022 1.005 - 1.030   pH, UA 6.0 5.0 - 7.5   Color, UA Yellow Yellow   Appearance Ur Turbid (A) Clear   Leukocytes,UA 1+ (A) Negative   Protein,UA Negative Negative/Trace   Glucose, UA Negative Negative   Ketones, UA Negative Negative   RBC, UA Negative Negative   Bilirubin, UA Negative Negative   Urobilinogen, Ur 0.2 0.2 - 1.0 mg/dL   Nitrite, UA Negative Negative   Microscopic Examination See below:     Comment: Microscopic was indicated and was performed.   Urinalysis Reflex Comment     Comment: This specimen has reflexed to a Urine Culture.  Microscopic Examination     Status: Abnormal   Collection Time: 08/10/21  1:10 PM   Urine  Result Value Ref Range   WBC, UA 6-10 (A) 0 - 5 /hpf   RBC 0-2 0 - 2 /hpf   Epithelial Cells (non renal) 0-10 0 - 10 /hpf   Casts None seen None seen /lpf   Bacteria, UA None seen None seen/Few  Urine Culture, Reflex     Status: None   Collection Time: 08/10/21  1:10 PM   Urine  Result Value Ref Range   Urine Culture, Routine Final report    Organism ID, Bacteria No growth         Assessment/Plan: 1. Encounter for general adult medical examination with abnormal findings CPE performed, routine fasting labs reviewed, up-to-date on  preventive health maintenance with next mammogram due in March  2. Encounter for screening mammogram for breast cancer We will go ahead and place order to be scheduled for March 2023 - MM 3D SCREEN BREAST BILATERAL; Future  3. Mixed hyperlipidemia Will continue Crestor  4. Hepatic steatosis Followed by GI  5. Low platelet count (HCC) Mildly low platelet count on recent labs that will require monitoring.  Patient is followed by heme-onc for elevated ferritin and has upcoming labs later this month, therefore we will hold off on ordering additional lab work  6. Hereditary hemochromatosis ([place]) Followed by heme-onc  7. Dysuria - UA/M w/rflx Culture, Routine  8. Visit for gynecologic examination Breast exam performed  General Counseling: Nicole verbalizes understanding of the findings of todays visit and agrees with plan of treatment. I have discussed any further diagnostic evaluation that may be needed or ordered today. We also reviewed her medications today. she has been encouraged to call the office with any questions or concerns that should arise related to todays visit.    Counseling:    Orders Placed This Encounter  Procedures   Microscopic Examination   Urine Culture, Reflex   MM 3D SCREEN BREAST BILATERAL   UA/M w/rflx Culture, Routine    No orders of the defined types were placed in this encounter.   This patient  was seen by Drema Dallas, PA-C in collaboration with Dr. Clayborn Bigness as a part of collaborative care agreement.  Total time spent:35 Minutes  Time spent includes review of chart, medications, test results, and follow up plan with the patient.     Lavera Guise, MD  Internal Medicine

## 2021-08-13 ENCOUNTER — Telehealth: Payer: Self-pay

## 2021-08-13 LAB — MICROSCOPIC EXAMINATION
Bacteria, UA: NONE SEEN
Casts: NONE SEEN /lpf

## 2021-08-13 LAB — UA/M W/RFLX CULTURE, ROUTINE
Bilirubin, UA: NEGATIVE
Glucose, UA: NEGATIVE
Ketones, UA: NEGATIVE
Nitrite, UA: NEGATIVE
Protein,UA: NEGATIVE
RBC, UA: NEGATIVE
Specific Gravity, UA: 1.022 (ref 1.005–1.030)
Urobilinogen, Ur: 0.2 mg/dL (ref 0.2–1.0)
pH, UA: 6 (ref 5.0–7.5)

## 2021-08-13 LAB — URINE CULTURE, REFLEX: Organism ID, Bacteria: NO GROWTH

## 2021-08-13 NOTE — Telephone Encounter (Signed)
Scheduled patient's mammogram for her. Notified spouse of 10/23/21 appointment @ 1:40-Toni

## 2021-08-19 ENCOUNTER — Inpatient Hospital Stay: Payer: Medicare Other | Attending: Oncology

## 2021-08-19 ENCOUNTER — Other Ambulatory Visit: Payer: Self-pay

## 2021-08-19 DIAGNOSIS — R7989 Other specified abnormal findings of blood chemistry: Secondary | ICD-10-CM | POA: Insufficient documentation

## 2021-08-19 LAB — CBC WITH DIFFERENTIAL/PLATELET
Abs Immature Granulocytes: 0.01 10*3/uL (ref 0.00–0.07)
Basophils Absolute: 0 10*3/uL (ref 0.0–0.1)
Basophils Relative: 1 %
Eosinophils Absolute: 0.2 10*3/uL (ref 0.0–0.5)
Eosinophils Relative: 4 %
HCT: 45.2 % (ref 36.0–46.0)
Hemoglobin: 15.3 g/dL — ABNORMAL HIGH (ref 12.0–15.0)
Immature Granulocytes: 0 %
Lymphocytes Relative: 43 %
Lymphs Abs: 2.7 10*3/uL (ref 0.7–4.0)
MCH: 31 pg (ref 26.0–34.0)
MCHC: 33.8 g/dL (ref 30.0–36.0)
MCV: 91.7 fL (ref 80.0–100.0)
Monocytes Absolute: 0.4 10*3/uL (ref 0.1–1.0)
Monocytes Relative: 6 %
Neutro Abs: 2.9 10*3/uL (ref 1.7–7.7)
Neutrophils Relative %: 46 %
Platelets: 159 10*3/uL (ref 150–400)
RBC: 4.93 MIL/uL (ref 3.87–5.11)
RDW: 12.2 % (ref 11.5–15.5)
WBC: 6.2 10*3/uL (ref 4.0–10.5)
nRBC: 0 % (ref 0.0–0.2)

## 2021-08-19 LAB — FERRITIN: Ferritin: 534 ng/mL — ABNORMAL HIGH (ref 11–307)

## 2021-08-19 LAB — COMPREHENSIVE METABOLIC PANEL
ALT: 25 U/L (ref 0–44)
AST: 25 U/L (ref 15–41)
Albumin: 4.3 g/dL (ref 3.5–5.0)
Alkaline Phosphatase: 95 U/L (ref 38–126)
Anion gap: 8 (ref 5–15)
BUN: 13 mg/dL (ref 8–23)
CO2: 25 mmol/L (ref 22–32)
Calcium: 9.3 mg/dL (ref 8.9–10.3)
Chloride: 104 mmol/L (ref 98–111)
Creatinine, Ser: 0.7 mg/dL (ref 0.44–1.00)
GFR, Estimated: >60 mL/min (ref >60)
Glucose, Bld: 93 mg/dL (ref 70–99)
Potassium: 3.8 mmol/L (ref 3.5–5.1)
Sodium: 137 mmol/L (ref 135–145)
Total Bilirubin: 0.8 mg/dL (ref 0.3–1.2)
Total Protein: 7.5 g/dL (ref 6.5–8.1)

## 2021-08-19 LAB — LACTATE DEHYDROGENASE: LDH: 139 U/L (ref 98–192)

## 2021-09-07 ENCOUNTER — Other Ambulatory Visit: Payer: Self-pay

## 2021-09-07 ENCOUNTER — Ambulatory Visit (INDEPENDENT_AMBULATORY_CARE_PROVIDER_SITE_OTHER): Payer: Medicare Other | Admitting: Physician Assistant

## 2021-09-07 ENCOUNTER — Encounter: Payer: Self-pay | Admitting: Physician Assistant

## 2021-09-07 DIAGNOSIS — K760 Fatty (change of) liver, not elsewhere classified: Secondary | ICD-10-CM

## 2021-09-07 DIAGNOSIS — E782 Mixed hyperlipidemia: Secondary | ICD-10-CM

## 2021-09-07 DIAGNOSIS — M79604 Pain in right leg: Secondary | ICD-10-CM | POA: Diagnosis not present

## 2021-09-07 NOTE — Progress Notes (Signed)
[facility] [address] [place], [place] [address]  Internal MEDICINE  Office Visit Note  Patient Name: Tammy Lam  [id number]  [id number]  Date of Service: 09/09/2021  Chief Complaint  Patient presents with   Follow-up   Hyperlipidemia   Leg Pain    Pt is having right lower leg pain/ankle - since yesterday    HPI Pt is here for routine follow up with her husband -Mammogram is scheduled for March 3 -Some right leg pain since cold weather started and heat was not on last night in home. She reports a hx of this with cold weather since ankle surgery 15 yrs ago. She states massaging is helping and does not think she needs any medications for this. Has GI appt  -Has heme/onc appt in March. Did see labs done via their office that show platelets normal again. Ferritin still found to be elevated and she is also followed by GI though states she hasnt seen them recently since she was in [place] and then had covid. They will reach out to their office regarding when she needs to follow up  Current Medication: Outpatient Encounter Medications as of 09/07/2021  Medication Sig   rosuvastatin (CRESTOR) 10 MG tablet Take 1 tablet (10 mg total) by mouth daily.   No facility-administered encounter medications on file as of 09/07/2021.    Surgical History: Past Surgical History:  Procedure Laterality Date   ANKLE SURGERY Right     Medical History: Past Medical History:  Diagnosis Date   Elevated ferritin    Headache    Hemochromatosis    Hyperlipidemia    Hyponatremia    Medical history non-contributory    Shoulder injury     Family History: Family History  Problem Relation Age of Onset   Liver disease Other    Stroke Mother    Breast cancer Neg Hx     Social History   Socioeconomic History   Marital status: Married    Spouse name: Not on file   Number of children: Not on file   Years of education: Not on file   Highest education level: Not on file   Occupational History   Not on file  Tobacco Use   Smoking status: Never   Smokeless tobacco: Never  Vaping Use   Vaping Use: Never used  Substance and Sexual Activity   Alcohol use: No    Alcohol/week: 0.0 standard drinks   Drug use: No   Sexual activity: Yes    Partners: Male  Other Topics Concern   Not on file  Social History Narrative   Not on file   Social Determinants of Health   Financial Resource Strain: Not on file  Food Insecurity: Not on file  Transportation Needs: Not on file  Physical Activity: Not on file  Stress: Not on file  Social Connections: Not on file  Intimate Partner Violence: Not on file      Review of Systems  Constitutional:  Negative for chills, fatigue and unexpected weight change.  HENT:  Negative for congestion, rhinorrhea, sneezing and sore throat.   Eyes:  Negative for redness.  Respiratory:  Negative for cough, chest tightness and shortness of breath.   Cardiovascular:  Negative for chest pain and palpitations.  Gastrointestinal:  Negative for abdominal pain, constipation, diarrhea, nausea and vomiting.  Genitourinary:  Negative for dysuria and frequency.  Musculoskeletal:  Positive for arthralgias and myalgias. Negative for back pain, joint swelling and neck pain.  Skin:  Negative  for rash.  Neurological: Negative.  Negative for tremors and numbness.  Hematological:  Negative for adenopathy. Does not bruise/bleed easily.  Psychiatric/Behavioral:  Negative for behavioral problems (Depression), sleep disturbance and suicidal ideas. The patient is not nervous/anxious.    Vital Signs: BP 127/74    Pulse 75    Temp 98.4 F (36.9 C)    Resp 16    Ht 5\' 5"  (1.651 m)    Wt 147 lb 6.4 oz (66.9 kg)    LMP  (LMP Unknown)    SpO2 96%    BMI 24.53 kg/m    Physical Exam Vitals and nursing note reviewed.  Constitutional:      General: She is not in acute distress.    Appearance: She is well-developed and normal weight. She is not diaphoretic.   HENT:     Head: Normocephalic and atraumatic.     Mouth/Throat:     Pharynx: No oropharyngeal exudate.  Eyes:     Pupils: Pupils are equal, round, and reactive to light.  Neck:     Thyroid: No thyromegaly.     Vascular: No JVD.     Trachea: No tracheal deviation.  Cardiovascular:     Rate and Rhythm: Normal rate and regular rhythm.     Heart sounds: Normal heart sounds. No murmur heard.   No friction rub. No gallop.  Pulmonary:     Effort: Pulmonary effort is normal. No respiratory distress.     Breath sounds: No wheezing or rales.  Chest:     Chest wall: No tenderness.  Abdominal:     General: Bowel sounds are normal.     Palpations: Abdomen is soft.  Musculoskeletal:        General: Normal range of motion.     Cervical back: Normal range of motion and neck supple.     Right lower leg: No edema.     Left lower leg: No edema.  Lymphadenopathy:     Cervical: No cervical adenopathy.  Skin:    General: Skin is warm and dry.  Neurological:     Mental Status: She is alert and oriented to person, place, and time.     Cranial Nerves: No cranial nerve deficit.  Psychiatric:        Behavior: Behavior normal.        Thought Content: Thought content normal.        Judgment: Judgment normal.       Assessment/Plan: 1. Right leg pain Muscle ache with cold weather and improves with massaging, may take ibuprofen as needed and should follow up if any worsening  2. Mixed hyperlipidemia Continue crestor  3. Hereditary hemochromatosis (HCC) Followed by heme/onc and GI  4. Hepatic steatosis Followed by GI   General Counseling: Makenah verbalizes understanding of the findings of todays visit and agrees with plan of treatment. I have discussed any further diagnostic evaluation that may be needed or ordered today. We also reviewed her medications today. she has been encouraged to call the office with any questions or concerns that should arise related to todays visit.    No orders  of the defined types were placed in this encounter.   No orders of the defined types were placed in this encounter.   This patient was seen by Lynn ItoLauren Griselle Rufer, PA-C in collaboration with Dr. Beverely RisenFozia Khan as a part of collaborative care agreement.   Total time spent:30 Minutes Time spent includes review of chart, medications, test results, and follow up plan with  the patient.      Dr Lyndon Code Internal medicine

## 2021-09-23 ENCOUNTER — Ambulatory Visit: Payer: Medicare Other | Admitting: Gastroenterology

## 2021-10-01 ENCOUNTER — Other Ambulatory Visit: Payer: Self-pay

## 2021-10-01 DIAGNOSIS — E782 Mixed hyperlipidemia: Secondary | ICD-10-CM

## 2021-10-01 MED ORDER — ROSUVASTATIN CALCIUM 10 MG PO TABS: 10.0000 mg | ORAL_TABLET | Freq: Every day | ORAL | 3 refills | Status: DC

## 2021-10-23 ENCOUNTER — Ambulatory Visit
Admission: RE | Admit: 2021-10-23 | Discharge: 2021-10-23 | Disposition: A | Discharge status: Home / Self Care | Payer: Medicare Other | Source: Ambulatory Visit | Attending: Physician Assistant | Admitting: Physician Assistant

## 2021-10-23 ENCOUNTER — Other Ambulatory Visit: Payer: Self-pay

## 2021-10-23 DIAGNOSIS — Z1231 Encounter for screening mammogram for malignant neoplasm of breast: Secondary | ICD-10-CM | POA: Insufficient documentation

## 2021-11-17 ENCOUNTER — Other Ambulatory Visit: Payer: Self-pay

## 2021-11-17 ENCOUNTER — Encounter: Payer: Self-pay | Admitting: Oncology

## 2021-11-17 ENCOUNTER — Inpatient Hospital Stay (HOSPITAL_BASED_OUTPATIENT_CLINIC_OR_DEPARTMENT_OTHER): Payer: Medicare Other | Admitting: Oncology

## 2021-11-17 ENCOUNTER — Inpatient Hospital Stay: Payer: Medicare Other | Attending: Oncology

## 2021-11-17 VITALS — BP 129/79 | HR 61 | Temp 97.9°F | Resp 16 | Wt 148.2 lb

## 2021-11-17 DIAGNOSIS — R7989 Other specified abnormal findings of blood chemistry: Secondary | ICD-10-CM | POA: Diagnosis not present

## 2021-11-17 LAB — COMPREHENSIVE METABOLIC PANEL
ALT: 27 U/L (ref 0–44)
AST: 31 U/L (ref 15–41)
Albumin: 4.2 g/dL (ref 3.5–5.0)
Alkaline Phosphatase: 89 U/L (ref 38–126)
Anion gap: 6 (ref 5–15)
BUN: 12 mg/dL (ref 8–23)
CO2: 24 mmol/L (ref 22–32)
Calcium: 8.9 mg/dL (ref 8.9–10.3)
Chloride: 108 mmol/L (ref 98–111)
Creatinine, Ser: 0.71 mg/dL (ref 0.44–1.00)
GFR, Estimated: >60 mL/min (ref >60)
Glucose, Bld: 104 mg/dL — ABNORMAL HIGH (ref 70–99)
Potassium: 3.5 mmol/L (ref 3.5–5.1)
Sodium: 138 mmol/L (ref 135–145)
Total Bilirubin: 0.6 mg/dL (ref 0.3–1.2)
Total Protein: 7.2 g/dL (ref 6.5–8.1)

## 2021-11-17 LAB — CBC WITH DIFFERENTIAL/PLATELET
Abs Immature Granulocytes: 0.01 10*3/uL (ref 0.00–0.07)
Basophils Absolute: 0 10*3/uL (ref 0.0–0.1)
Basophils Relative: 1 %
Eosinophils Absolute: 0.3 10*3/uL (ref 0.0–0.5)
Eosinophils Relative: 5 %
HCT: 44 % (ref 36.0–46.0)
Hemoglobin: 14.9 g/dL (ref 12.0–15.0)
Immature Granulocytes: 0 %
Lymphocytes Relative: 45 %
Lymphs Abs: 2.4 10*3/uL (ref 0.7–4.0)
MCH: 31.8 pg (ref 26.0–34.0)
MCHC: 33.9 g/dL (ref 30.0–36.0)
MCV: 93.8 fL (ref 80.0–100.0)
Monocytes Absolute: 0.4 10*3/uL (ref 0.1–1.0)
Monocytes Relative: 7 %
Neutro Abs: 2.2 10*3/uL (ref 1.7–7.7)
Neutrophils Relative %: 42 %
Platelets: 144 10*3/uL — ABNORMAL LOW (ref 150–400)
RBC: 4.69 MIL/uL (ref 3.87–5.11)
RDW: 12.4 % (ref 11.5–15.5)
WBC: 5.3 10*3/uL (ref 4.0–10.5)
nRBC: 0 % (ref 0.0–0.2)

## 2021-11-17 LAB — LACTATE DEHYDROGENASE: LDH: 163 U/L (ref 98–192)

## 2021-11-17 LAB — FERRITIN: Ferritin: 398 ng/mL — ABNORMAL HIGH (ref 11–307)

## 2021-11-17 NOTE — Progress Notes (Signed)
? ? ? ?Hematology/Oncology Consult note ?[facility]  ?[phone] [phone] ? ?Patient Care Team: ?Lyndon CodeKhan, Fozia M, MD as PCP - General (Internal Medicine)  ? ?Name of the patient: Tammy Lam  ?[id number]  ?02/14/1956  ? ?Date of visit: 11/17/21 ? ?Diagnosis-heterozygosity for H63D mutation ?Elevated ferritin of unclear etiology ? ?Chief complaint/ Reason for visit-routine follow-up of elevated ferritin ? ?Heme/Onc history: Patient is a 66 year old [place] female.  History obtained with the help of [place] interpreter.  She was diagnosed with heterozygosity for H63D back in April 2017.  She has had mildly elevated ferritin that fluctuates between 400-500 since then.  Iron saturation is mainly remain between 30 to 45% but never higher.  Right upper quadrant ultrasound in June 2020 showed possible fatty infiltration with potential 3.1 cm mass lesion versus area of focal sparing.  She had a liver MRI in July 2020 which showed moderate to severe hepatic steatosis.  Focal fatty sparing in the central left lobe corresponding to the lesion seen on ultrasound.  No evidence of abnormal iron deposition was seen.  Hepatitis B and C serologies were negative in May 2020.  AFP has been within normal limits so far.  She was seen again in April 2022 and underwent 1 session of phlebotomy.  She is a nonalcoholic.  Seen by GI Dr. Tobi BastosAnna ? ?Interval history-history obtained with the help of [place] interpreter.  Patient reports doing well overall.  Denies any specific complaints at this time. ? ?ECOG PS- 1 ?Pain scale- 0 ? ? ?Review of systems- Review of Systems  ?Constitutional:  Negative for chills, fever, malaise/fatigue and weight loss.  ?HENT:  Negative for congestion, ear discharge and nosebleeds.   ?Eyes:  Negative for blurred vision.  ?Respiratory:  Negative for cough, hemoptysis, sputum production, shortness of breath and wheezing.   ?Cardiovascular:  Negative for chest pain,  palpitations, orthopnea and claudication.  ?Gastrointestinal:  Negative for abdominal pain, blood in stool, constipation, diarrhea, heartburn, melena, nausea and vomiting.  ?Genitourinary:  Negative for dysuria, flank pain, frequency, hematuria and urgency.  ?Musculoskeletal:  Negative for back pain, joint pain and myalgias.  ?Skin:  Negative for rash.  ?Neurological:  Negative for dizziness, tingling, focal weakness, seizures, weakness and headaches.  ?Endo/Heme/Allergies:  Does not bruise/bleed easily.  ?Psychiatric/Behavioral:  Negative for depression and suicidal ideas. The patient does not have insomnia.    ? ? ? ?Allergies  ?Allergen Reactions  ? Diphenhydramine Other (See Comments)  ? Benadryl [Diphenhydramine Hcl] Itching  ? Influenza Vaccines   ? Penicillins Itching  ? Tricor [Fenofibrate]   ? ? ? ?Past Medical History:  ?Diagnosis Date  ? Elevated ferritin   ? Headache   ? Hemochromatosis   ? Hyperlipidemia   ? Hyponatremia   ? Medical history non-contributory   ? Shoulder injury   ? ? ? ?Past Surgical History:  ?Procedure Laterality Date  ? ANKLE SURGERY Right   ? ? ?Social History  ? ?Socioeconomic History  ? Marital status: Married  ?  Spouse name: Not on file  ? Number of children: Not on file  ? Years of education: Not on file  ? Highest education level: Not on file  ?Occupational History  ? Not on file  ?Tobacco Use  ? Smoking status: Never  ? Smokeless tobacco: Never  ?Vaping Use  ? Vaping Use: Never used  ?Substance and Sexual Activity  ? Alcohol use: No  ?  Alcohol/week: 0.0 standard drinks  ? Drug use:  No  ? Sexual activity: Yes  ?  Partners: Male  ?Other Topics Concern  ? Not on file  ?Social History Narrative  ? Not on file  ? ?Social Determinants of Health  ? ?Financial Resource Strain: Not on file  ?Food Insecurity: Not on file  ?Transportation Needs: Not on file  ?Physical Activity: Not on file  ?Stress: Not on file  ?Social Connections: Not on file  ?Intimate Partner Violence: Not on file   ? ? ?Family History  ?Problem Relation Age of Onset  ? Liver disease Other   ? Stroke Mother   ? Breast cancer Neg Hx   ? ? ? ?Current Outpatient Medications:  ?  rosuvastatin (CRESTOR) 10 MG tablet, Take 1 tablet (10 mg total) by mouth daily., Disp: 90 tablet, Rfl: 3 ? ?Physical exam:  ?Vitals:  ? 11/17/21 0953  ?BP: 129/79  ?Pulse: 61  ?Resp: 16  ?Temp: 97.9 ?F (36.6 ?C)  ?SpO2: 99%  ?Weight: 148 lb 3.2 oz (67.2 kg)  ? ?Physical Exam ?Constitutional:   ?   General: She is not in acute distress. ?Cardiovascular:  ?   Rate and Rhythm: Normal rate and regular rhythm.  ?   Heart sounds: Normal heart sounds.  ?Pulmonary:  ?   Effort: Pulmonary effort is normal.  ?   Breath sounds: Normal breath sounds.  ?Abdominal:  ?   General: Bowel sounds are normal. There is no distension.  ?   Palpations: Abdomen is soft.  ?   Tenderness: There is no abdominal tenderness.  ?Skin: ?   General: Skin is warm and dry.  ?Neurological:  ?   Mental Status: She is alert and oriented to person, place, and time.  ?  ? ? ?  Latest Ref Rng & Units 11/17/2021  ?  9:19 AM  ?CMP  ?Glucose 70 - 99 mg/dL 202    ?BUN 8 - 23 mg/dL 12    ?Creatinine 0.44 - 1.00 mg/dL 5.42    ?Sodium 135 - 145 mmol/L 138    ?Potassium 3.5 - 5.1 mmol/L 3.5    ?Chloride 98 - 111 mmol/L 108    ?CO2 22 - 32 mmol/L 24    ?Calcium 8.9 - 10.3 mg/dL 8.9    ?Total Protein 6.5 - 8.1 g/dL 7.2    ?Total Bilirubin 0.3 - 1.2 mg/dL 0.6    ?Alkaline Phos 38 - 126 U/L 89    ?AST 15 - 41 U/L 31    ?ALT 0 - 44 U/L 27    ? ? ?  Latest Ref Rng & Units 11/17/2021  ?  9:19 AM  ?CBC  ?WBC 4.0 - 10.5 K/uL 5.3    ?Hemoglobin 12.0 - 15.0 g/dL 70.6    ?Hematocrit 36.0 - 46.0 % 44.0    ?Platelets 150 - 400 K/uL 144    ? ? ?No images are attached to the encounter. ? ?MM 3D SCREEN BREAST BILATERAL ? ?Result Date: 10/23/2021 ?CLINICAL DATA:  Screening. EXAM: DIGITAL SCREENING BILATERAL MAMMOGRAM WITH TOMOSYNTHESIS AND CAD TECHNIQUE: Bilateral screening digital craniocaudal and mediolateral oblique  mammograms were obtained. Bilateral screening digital breast tomosynthesis was performed. The images were evaluated with computer-aided detection. COMPARISON:  Previous exam(s). ACR Breast Density Category b: There are scattered areas of fibroglandular density. FINDINGS: There are no findings suspicious for malignancy. IMPRESSION: No mammographic evidence of malignancy. A result letter of this screening mammogram will be mailed directly to the patient. RECOMMENDATION: Screening mammogram in one year. (Code:SM-B-01Y) BI-RADS CATEGORY  1: Negative. Electronically Signed   By: Ted Mcalpine M.D.   On: 10/23/2021 14:12   ? ? ?Assessment and plan- Patient is a 66 y.o. female with history of heterozygosity for H63D mutationHere for routine follow-up of elevated ferritin ? ?Patient's ferritin levels have been elevated now for the last 2 to 3 years with levels fluctuating between 400s to 600s.  Presently it is 398.  LFTs are normal and CBC is normal.  She was supposed to get her elastography ultrasound to rule out cirrhosis which was ordered by Dr. Tobi Bastos but patient states that she did not go for it as she came down with COVID infection.  Her last ultrasound abdomen from June 2022 which showed diffuse increased hepatic echogenicity which could be seen with fatty infiltration cirrhosis and hemochromatosis.  I will plan to repeat her ultrasound at this time. ? ?Overall her ferritin levels are mildly elevated and I do not see a reason to do phlebotomy at this time.  Heterozygosity for H63D does not lead toClinical iron overload.  I will see her back in 6 months with CBC with differential CMP ferritin and iron studies and AFP ?  ?Visit Diagnosis ?1. 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 ? ? ?Dr. Owens Shark, MD, MPH ?[facility] ?[id number] ?11/17/2021 ?1:21 PM ? ? ? ? ? ? ?    ? ? ? ? ? ?

## 2021-12-04 ENCOUNTER — Telehealth: Payer: Self-pay

## 2021-12-04 NOTE — Telephone Encounter (Signed)
Pt husband call now that she having dizziness and headache as per lauren advised him to take her to urgent care due to no appt available in afternoon ?

## 2022-01-28 ENCOUNTER — Encounter: Payer: Self-pay | Admitting: Physician Assistant

## 2022-01-28 ENCOUNTER — Telehealth (INDEPENDENT_AMBULATORY_CARE_PROVIDER_SITE_OTHER): Payer: Medicare Other | Admitting: Physician Assistant

## 2022-01-28 ENCOUNTER — Telehealth: Payer: Medicare Other | Admitting: Physician Assistant

## 2022-01-28 VITALS — Temp 96.8°F | Resp 16 | Ht 65.0 in | Wt 148.0 lb

## 2022-01-28 DIAGNOSIS — R059 Cough, unspecified: Secondary | ICD-10-CM

## 2022-01-28 DIAGNOSIS — J029 Acute pharyngitis, unspecified: Secondary | ICD-10-CM | POA: Diagnosis not present

## 2022-01-28 NOTE — Progress Notes (Signed)
[facility] [address] [place], [place] [address]  Internal MEDICINE  Telephone Visit  Patient Name: Tammy Lam  [id number]  [id number]  Date of Service: 01/28/2022  I connected with the patient at 10:44 by telephone and verified the patients identity using two identifiers.   I discussed the limitations, risks, security and privacy concerns of performing an evaluation and management service by telephone and the availability of in person appointments. I also discussed with the patient that there may be a patient responsible charge related to the service.  The patient expressed understanding and agrees to proceed.    Chief Complaint  Patient presents with   Telephone Assessment    Covid test is negative, Symptoms  start 2 days    Telephone Screen    [phone] video   Cough   Sore Throat   Fatigue    HPI Pt is here for virtual sick visit with her husband -She has been experiencing cough, sore throat, and fatigue for the past 2 days. Denies feeling congested or havign sinnus pressure. Her covid test was negative. -Doing better with dayquil and nyquil. -Post nasal drip and advised to try nasal spray as well as warm tea with honey, soup, or throat lozenges to soothe throat. -Advised to call if not feeling better/worsening  Current Medication: Outpatient Encounter Medications as of 01/28/2022  Medication Sig   rosuvastatin (CRESTOR) 10 MG tablet Take 1 tablet (10 mg total) by mouth daily.   No facility-administered encounter medications on file as of 01/28/2022.    Surgical History: Past Surgical History:  Procedure Laterality Date   ANKLE SURGERY Right     Medical History: Past Medical History:  Diagnosis Date   Elevated ferritin    Headache    Hemochromatosis    Hyperlipidemia    Hyponatremia    Medical history non-contributory    Shoulder injury     Family History: Family History  Problem Relation Age of Onset   Liver disease Other    Stroke Mother     Breast cancer Neg Hx     Social History   Socioeconomic History   Marital status: Married    Spouse name: Not on file   Number of children: Not on file   Years of education: Not on file   Highest education level: Not on file  Occupational History   Not on file  Tobacco Use   Smoking status: Never   Smokeless tobacco: Never  Vaping Use   Vaping Use: Never used  Substance and Sexual Activity   Alcohol use: No    Alcohol/week: 0.0 standard drinks of alcohol   Drug use: No   Sexual activity: Yes    Partners: Male  Other Topics Concern   Not on file  Social History Narrative   Not on file   Social Determinants of Health   Financial Resource Strain: Not on file  Food Insecurity: Not on file  Transportation Needs: Not on file  Physical Activity: Not on file  Stress: Not on file  Social Connections: Not on file  Intimate Partner Violence: Not on file      Review of Systems  Constitutional:  Positive for fatigue. Negative for fever.  HENT:  Positive for postnasal drip and sore throat. Negative for congestion, mouth sores and sinus pressure.   Respiratory:  Positive for cough. Negative for shortness of breath and wheezing.   Cardiovascular:  Negative for chest pain.  Genitourinary:  Negative for flank pain.  Psychiatric/Behavioral: Negative.  Vital Signs: Temp (!) 96.8 F (36 C)   Resp 16   Ht 5\' 5"  (1.651 m)   Wt 148 lb (67.1 kg)   LMP  (LMP Unknown)   BMI 24.63 kg/m    Observation/Objective:  Pt is not in any acute distress.   Assessment/Plan: 1. Sore throat May continue dayquil/nyquil and start nasal spray to help post nasal drip. Advised to stay hydrated and try warm tea with honey, warm soup, or throat lozenges to help soothe throat. Given short timeline and improvement with OTC treatment will hold off on any ABX at this time, but will call if worsening/not improving.  2. Cough in adult May continue dayquil/nyquil and start nasal spray to help  post nasal drip. Will call if worsening/not improving.    General Counseling: Tammy Lam verbalizes understanding of the findings of today's phone visit and agrees with plan of treatment. I have discussed any further diagnostic evaluation that may be needed or ordered today. We also reviewed her medications today. she has been encouraged to call the office with any questions or concerns that should arise related to todays visit.    No orders of the defined types were placed in this encounter.   No orders of the defined types were placed in this encounter.   Time spent:25 Minutes    Dr June Internal medicine

## 2022-02-02 ENCOUNTER — Other Ambulatory Visit: Payer: Self-pay

## 2022-02-02 ENCOUNTER — Telehealth: Payer: Self-pay

## 2022-02-02 MED ORDER — BENZONATATE 100 MG PO CAPS: 100.0000 mg | ORAL_CAPSULE | Freq: Two times a day (BID) | ORAL | 0 refills | Status: DC | PRN

## 2022-02-02 MED ORDER — AZITHROMYCIN 250 MG PO TABS: 250.0000 mg | ORAL_TABLET | Freq: Every day | ORAL | 0 refills | Status: DC

## 2022-02-02 NOTE — Telephone Encounter (Signed)
Pt husband called  that she still having cough and running nose ,no fever and clear mucus do you like to send her antibiotic as per lauren advised pt husband that we send Zpak and tessalon pearl for cough

## 2022-02-08 ENCOUNTER — Encounter: Payer: Self-pay | Admitting: Nurse Practitioner

## 2022-02-08 ENCOUNTER — Ambulatory Visit (INDEPENDENT_AMBULATORY_CARE_PROVIDER_SITE_OTHER): Payer: Medicare Other | Admitting: Nurse Practitioner

## 2022-02-08 VITALS — BP 130/74 | HR 74 | Temp 98.2°F | Resp 16 | Ht 65.0 in | Wt 149.4 lb

## 2022-02-08 DIAGNOSIS — Z23 Encounter for immunization: Secondary | ICD-10-CM

## 2022-02-08 DIAGNOSIS — M79604 Pain in right leg: Secondary | ICD-10-CM

## 2022-02-08 DIAGNOSIS — R7301 Impaired fasting glucose: Secondary | ICD-10-CM | POA: Diagnosis not present

## 2022-02-08 DIAGNOSIS — R7303 Prediabetes: Secondary | ICD-10-CM

## 2022-02-08 DIAGNOSIS — E782 Mixed hyperlipidemia: Secondary | ICD-10-CM | POA: Diagnosis not present

## 2022-02-08 LAB — POCT GLYCOSYLATED HEMOGLOBIN (HGB A1C): Hemoglobin A1C: 6.3 % — AB (ref 4.0–5.6)

## 2022-02-08 MED ORDER — PNEUMOCOCCAL 20-VAL CONJ VACC 0.5 ML IM SUSY: 0.5000 mL | PREFILLED_SYRINGE | INTRAMUSCULAR | 0 refills | Status: AC

## 2022-02-08 MED ORDER — ZOSTER VAC RECOMB ADJUVANTED 50 MCG/0.5ML IM SUSR: 0.5000 mL | Freq: Once | INTRAMUSCULAR | 0 refills | Status: AC

## 2022-02-08 NOTE — Progress Notes (Incomplete)
[id number] [address] [place], [place] [address]  Internal MEDICINE  Office Visit Note  Patient Name: Tammy Lam  [id number]  [id number]  Date of Service: 02/08/2022  Chief Complaint  Patient presents with  . Follow-up  . Hyperlipidemia  . Quality Metric Gaps    pneumovax    HPI Tammy Lam presents for a follow-up visit for  Right leg pain resolved, massages.  URI resolved.  Atorvastatin for elevated cholesterol. BP good   Current Medication: Outpatient Encounter Medications as of 02/08/2022  Medication Sig  . benzonatate (TESSALON) 100 MG capsule Take 1 capsule (100 mg total) by mouth 2 (two) times daily as needed for cough.  . pneumococcal 20-valent conjugate vaccine (PREVNAR 20) 0.5 ML injection Inject 0.5 mLs into the muscle tomorrow at 10 am for 1 dose.  . rosuvastatin (CRESTOR) 10 MG tablet Take 1 tablet (10 mg total) by mouth daily.  Marland Kitchen Zoster Vaccine Adjuvanted [facility]) injection Inject 0.5 mLs into the muscle once for 1 dose.  . [DISCONTINUED] azithromycin (ZITHROMAX) 250 MG tablet Take 1 tablet (250 mg total) by mouth daily. (Patient not taking: Reported on 02/08/2022)   No facility-administered encounter medications on file as of 02/08/2022.    Surgical History: Past Surgical History:  Procedure Laterality Date  . ANKLE SURGERY Right     Medical History: Past Medical History:  Diagnosis Date  . Elevated ferritin   . Headache   . Hemochromatosis   . Hyperlipidemia   . Hyponatremia   . Medical history non-contributory   . Shoulder injury     Family History: Family History  Problem Relation Age of Onset  . Liver disease Other   . Stroke Mother   . Breast cancer Neg Hx     Social History   Socioeconomic History  . Marital status: Married    Spouse name: Not on file  . Number of children: Not on file  . Years of education: Not on file  . Highest education level: Not on file  Occupational History  . Not on file  Tobacco Use   . Smoking status: Never  . Smokeless tobacco: Never  Vaping Use  . Vaping Use: Never used  Substance and Sexual Activity  . Alcohol use: No    Alcohol/week: 0.0 standard drinks of alcohol  . Drug use: No  . Sexual activity: Yes    Partners: Male  Other Topics Concern  . Not on file  Social History Narrative  . Not on file   Social Determinants of Health   Financial Resource Strain: Not on file  Food Insecurity: Not on file  Transportation Needs: Not on file  Physical Activity: Not on file  Stress: Not on file  Social Connections: Not on file  Intimate Partner Violence: Not on file      Review of Systems  Vital Signs: BP 130/74   Pulse 74   Temp 98.2 F (36.8 C)   Resp 16   Ht 5\' 5"  (1.651 m)   Wt 149 lb 6.4 oz (67.8 kg)   LMP  (LMP Unknown)   SpO2 98%   BMI 24.86 kg/m    Physical Exam     Assessment/Plan:   General Counseling: Tammy Lam verbalizes understanding of the findings of todays visit and agrees with plan of treatment. I have discussed any further diagnostic evaluation that may be needed or ordered today. We also reviewed her medications today. she has been encouraged to call the office with any questions or concerns that  should arise related to todays visit.    Orders Placed This Encounter  Procedures  . POCT glycosylated hemoglobin (Hb A1C)    Meds ordered this encounter  Medications  . pneumococcal 20-valent conjugate vaccine (PREVNAR 20) 0.5 ML injection    Sig: Inject 0.5 mLs into the muscle tomorrow at 10 am for 1 dose.    Dispense:  0.5 mL    Refill:  0  . Zoster Vaccine Adjuvanted [facility]) injection    Sig: Inject 0.5 mLs into the muscle once for 1 dose.    Dispense:  0.5 mL    Refill:  0    No follow-ups on file.   Total time spent:*** Minutes Time spent includes review of chart, medications, test results, and follow up plan with the patient.   [place] Controlled Substance Database was reviewed by me.  This patient was seen  by Tammy Kuster, FNP-C in collaboration with Tammy Lam as a part of collaborative care agreement.   Tammy Lam R. Tedd Sias, MSN, FNP-C Internal medicine

## 2022-02-09 ENCOUNTER — Encounter: Payer: Self-pay | Admitting: Nurse Practitioner

## 2022-02-09 DIAGNOSIS — R7303 Prediabetes: Secondary | ICD-10-CM | POA: Insufficient documentation

## 2022-03-08 ENCOUNTER — Encounter: Payer: Self-pay | Admitting: Physician Assistant

## 2022-03-08 ENCOUNTER — Ambulatory Visit (INDEPENDENT_AMBULATORY_CARE_PROVIDER_SITE_OTHER): Payer: Medicare Other | Admitting: Physician Assistant

## 2022-03-08 VITALS — BP 102/66 | HR 77 | Temp 98.0°F | Resp 16 | Ht 65.0 in | Wt 146.0 lb

## 2022-03-08 DIAGNOSIS — R7303 Prediabetes: Secondary | ICD-10-CM | POA: Diagnosis not present

## 2022-03-08 DIAGNOSIS — E782 Mixed hyperlipidemia: Secondary | ICD-10-CM | POA: Diagnosis not present

## 2022-03-08 NOTE — Progress Notes (Signed)
[facility] [address] [place], [place] [address]  Internal MEDICINE  Office Visit Note  Patient Name: Tammy Lam  [id number]  [id number]  Date of Service: 03/10/2022  Chief Complaint  Patient presents with   Follow-up   Hyperlipidemia    HPI Pt is here for routine follow up -Pt got her PNA and shingles vaccines a few days ago and arms are still sore -She is followed by heme/onc for elevated ferritin and hemochromatosis with plan for updated [place] -She was found to be prediabetic last visit with an A1c of 6.3. She was given information about diabetes and nutrition changes and has been adjusting accordingly -Will need to recheck A1c next visit and consider medication if not improving  Current Medication: Outpatient Encounter Medications as of 03/08/2022  Medication Sig   benzonatate (TESSALON) 100 MG capsule Take 1 capsule (100 mg total) by mouth 2 (two) times daily as needed for cough.   rosuvastatin (CRESTOR) 10 MG tablet Take 1 tablet (10 mg total) by mouth daily.   No facility-administered encounter medications on file as of 03/08/2022.    Surgical History: Past Surgical History:  Procedure Laterality Date   ANKLE SURGERY Right     Medical History: Past Medical History:  Diagnosis Date   Elevated ferritin    Headache    Hemochromatosis    Hyperlipidemia    Hyponatremia    Medical history non-contributory    Shoulder injury     Family History: Family History  Problem Relation Age of Onset   Liver disease Other    Stroke Mother    Breast cancer Neg Hx     Social History   Socioeconomic History   Marital status: Married    Spouse name: Not on file   Number of children: Not on file   Years of education: Not on file   Highest education level: Not on file  Occupational History   Not on file  Tobacco Use   Smoking status: Never   Smokeless tobacco: Never  Vaping Use   Vaping Use: Never used  Substance and Sexual Activity   Alcohol use: No     Alcohol/week: 0.0 standard drinks of alcohol   Drug use: No   Sexual activity: Yes    Partners: Male  Other Topics Concern   Not on file  Social History Narrative   Not on file   Social Determinants of Health   Financial Resource Strain: Not on file  Food Insecurity: Not on file  Transportation Needs: Not on file  Physical Activity: Not on file  Stress: Not on file  Social Connections: Not on file  Intimate Partner Violence: Not on file      Review of Systems  Constitutional:  Negative for chills, fatigue and unexpected weight change.  HENT:  Negative for congestion, postnasal drip, rhinorrhea, sneezing and sore throat.   Eyes:  Negative for redness.  Respiratory:  Negative for cough, chest tightness and shortness of breath.   Cardiovascular:  Negative for chest pain and palpitations.  Gastrointestinal:  Negative for abdominal pain, constipation, diarrhea, nausea and vomiting.  Genitourinary:  Negative for dysuria and frequency.  Musculoskeletal:  Positive for arthralgias. Negative for back pain, joint swelling and neck pain.  Skin:  Negative for rash.  Neurological: Negative.  Negative for tremors and numbness.  Hematological:  Negative for adenopathy. Does not bruise/bleed easily.  Psychiatric/Behavioral:  Negative for behavioral problems (Depression), sleep disturbance and suicidal ideas. The patient is not nervous/anxious.  Vital Signs: BP 102/66   Pulse 77   Temp 98 F (36.7 C)   Resp 16   Ht 5\' 5"  (1.651 m)   Wt 146 lb (66.2 kg)   LMP  (LMP Unknown)   SpO2 97%   BMI 24.30 kg/m    Physical Exam Vitals and nursing note reviewed.  Constitutional:      General: She is not in acute distress.    Appearance: Normal appearance. She is normal weight. She is not ill-appearing.  HENT:     Head: Normocephalic and atraumatic.  Eyes:     Pupils: Pupils are equal, round, and reactive to light.  Cardiovascular:     Rate and Rhythm: Normal rate and regular  rhythm.  Pulmonary:     Effort: Pulmonary effort is normal. No respiratory distress.  Musculoskeletal:        General: Tenderness present.     Cervical back: Normal range of motion.     Comments: Shoulders tender from vaccines  Skin:    General: Skin is warm and dry.  Neurological:     Mental Status: She is alert and oriented to person, place, and time.  Psychiatric:        Mood and Affect: Mood normal.        Behavior: Behavior normal.        Assessment/Plan: 1. Prediabetes Will recheck A1c next visit and continue to work on diet and exercise. May need to consider medication if rising  2. Mixed hyperlipidemia Continue crestor  3. Hereditary hemochromatosis (HCC) Followed by heme/onc   General Counseling: Tammy Lam verbalizes understanding of the findings of todays visit and agrees with plan of treatment. I have discussed any further diagnostic evaluation that may be needed or ordered today. We also reviewed her medications today. she has been encouraged to call the office with any questions or concerns that should arise related to todays visit.    No orders of the defined types were placed in this encounter.   No orders of the defined types were placed in this encounter.   This patient was seen by , PA-C in collaboration with Dr. Lynn Ito as a part of collaborative care agreement.   Total time spent:30 Minutes Time spent includes review of chart, medications, test results, and follow up plan with the patient.      Dr Beverely Risen Internal medicine

## 2022-05-04 ENCOUNTER — Ambulatory Visit (INDEPENDENT_AMBULATORY_CARE_PROVIDER_SITE_OTHER): Payer: Medicare Other | Admitting: Internal Medicine

## 2022-05-04 ENCOUNTER — Encounter: Payer: Self-pay | Admitting: Internal Medicine

## 2022-05-04 VITALS — BP 118/71 | HR 66 | Temp 98.5°F | Resp 16 | Ht 65.0 in | Wt 147.2 lb

## 2022-05-04 DIAGNOSIS — M1711 Unilateral primary osteoarthritis, right knee: Secondary | ICD-10-CM | POA: Insufficient documentation

## 2022-05-04 DIAGNOSIS — M25561 Pain in right knee: Secondary | ICD-10-CM | POA: Diagnosis not present

## 2022-05-04 DIAGNOSIS — R262 Difficulty in walking, not elsewhere classified: Secondary | ICD-10-CM | POA: Diagnosis not present

## 2022-05-04 NOTE — Progress Notes (Signed)
[facility] [address] [place], [place] [address]  Internal MEDICINE  Office Visit Note  Patient Name: Tammy Lam  [id number]  [id number]  Date of Service: 05/04/2022  Chief Complaint  Patient presents with   Knee Pain    Patient slipped on water but caught herself from falling down completely, while doing this she hurt her right knee/lower leg, patient has had surgery on this same ankle in the past .. Pain Scale: 6 out of 10     HPI Pt is here for a sick visit.  Patient is here for acute and sick visit, she sustained a fall 2 weeks ago twisted her right ankle and knee, it has gotten worse patient has difficulty ambulating lives with her husband and it is hard for her to walk around and take care of her ADLs Pain has gotten worse    Current Medication:  Outpatient Encounter Medications as of 05/04/2022  Medication Sig   benzonatate (TESSALON) 100 MG capsule Take 1 capsule (100 mg total) by mouth 2 (two) times daily as needed for cough.   rosuvastatin (CRESTOR) 10 MG tablet Take 1 tablet (10 mg total) by mouth daily.   No facility-administered encounter medications on file as of 05/04/2022.      Medical History: Past Medical History:  Diagnosis Date   Elevated ferritin    Headache    Hemochromatosis    Hyperlipidemia    Hyponatremia    Medical history non-contributory    Shoulder injury      Vital Signs: BP 118/71   Pulse 66   Temp 98.5 F (36.9 C)   Resp 16   Ht 5\' 5"  (1.651 m)   Wt 147 lb 3.2 oz (66.8 kg)   LMP  (LMP Unknown)   SpO2 96%   BMI 24.50 kg/m    Review of Systems  Constitutional:  Negative for fatigue and fever.  HENT:  Negative for congestion, mouth sores and postnasal drip.   Respiratory:  Negative for cough.   Cardiovascular:  Negative for chest pain.  Genitourinary:  Negative for flank pain.  Musculoskeletal:  Positive for gait problem.  Psychiatric/Behavioral: Negative.      Physical Exam Constitutional:       Appearance: Normal appearance.  HENT:     Head: Normocephalic and atraumatic.     Nose: Nose normal.     Mouth/Throat:     Mouth: Mucous membranes are moist.     Pharynx: No posterior oropharyngeal erythema.  Eyes:     Extraocular Movements: Extraocular movements intact.     Pupils: Pupils are equal, round, and reactive to light.  Cardiovascular:     Pulses: Normal pulses.     Heart sounds: Normal heart sounds.  Pulmonary:     Effort: Pulmonary effort is normal.     Breath sounds: Normal breath sounds.  Musculoskeletal:        General: Swelling and tenderness present.  Neurological:     General: No focal deficit present.     Mental Status: She is alert.  Psychiatric:        Mood and Affect: Mood normal.        Behavior: Behavior normal.       Assessment/Plan: 1. Posterior right knee pain Pt most likely has a tear, will need to be seen asap at ortho  - Ambulatory referral to Orthopedic Surgery  2. Ambulatory dysfunction Will need W/C or crutches  - Ambulatory referral to Orthopedic Surgery   General Counseling: Treniyah  verbalizes understanding of the findings of todays visit and agrees with plan of treatment. I have discussed any further diagnostic evaluation that may be needed or ordered today. We also reviewed her medications today. she has been encouraged to call the office with any questions or concerns that should arise related to todays visit.     Orders Placed This Encounter  Procedures   Ambulatory referral to Orthopedic Surgery     [place] Controlled Substance Database was reviewed by me.  Time spent:25 Minutes

## 2022-05-10 ENCOUNTER — Encounter: Payer: Self-pay | Admitting: Physician Assistant

## 2022-05-10 ENCOUNTER — Ambulatory Visit (INDEPENDENT_AMBULATORY_CARE_PROVIDER_SITE_OTHER): Payer: Medicare Other | Admitting: Physician Assistant

## 2022-05-10 VITALS — BP 126/68 | HR 65 | Temp 98.7°F | Resp 16 | Ht 65.0 in | Wt 146.2 lb

## 2022-05-10 DIAGNOSIS — M25561 Pain in right knee: Secondary | ICD-10-CM

## 2022-05-10 DIAGNOSIS — E559 Vitamin D deficiency, unspecified: Secondary | ICD-10-CM | POA: Diagnosis not present

## 2022-05-10 DIAGNOSIS — R7303 Prediabetes: Secondary | ICD-10-CM | POA: Diagnosis not present

## 2022-05-10 DIAGNOSIS — E782 Mixed hyperlipidemia: Secondary | ICD-10-CM

## 2022-05-10 DIAGNOSIS — R5383 Other fatigue: Secondary | ICD-10-CM

## 2022-05-10 LAB — POCT GLYCOSYLATED HEMOGLOBIN (HGB A1C): Hemoglobin A1C: 6.1 % — AB (ref 4.0–5.6)

## 2022-05-10 NOTE — Progress Notes (Signed)
[facility] [address] [place], [place] [address]  Internal MEDICINE  Office Visit Note  Patient Name: Tammy Lam  [id number]  [id number]  Date of Service: 05/10/2022  Chief Complaint  Patient presents with   Follow-up   Hyperlipidemia   Knee Pain    Was seen last week by [facility] for a fall - patient hurt her knee, had imaging done, no findings of a broken bone    HPI Pt is here for routine follow up -Had slipped and fell about 3 weeks ago and hurt her right knee. She was seen in office by Dr. Welton Flakes last week and ortho referral placed due to concern for tear in right knee. She went to ortho same day and reports her Xray didn't show any acute problems.  -She is using biofreeze and massaging right knee. Doesn't like to take ibuprofen as it is hard on her stomach. Discussed voltaren as another topical option -Still hurts behind right knee. Has another appt in 2 weeks with ortho. Pain is a little better, but still hurts. Hurts standing and walking.  -A1c is improved today. She reports she drinks only water. Has been doing more brown rice. Had been exercising more until knee has limited some.  -Does follow up with hematology this month, will order routine fasting labs, not already ordered by hematology, prior to CPE   Current Medication: Outpatient Encounter Medications as of 05/10/2022  Medication Sig   benzonatate (TESSALON) 100 MG capsule Take 1 capsule (100 mg total) by mouth 2 (two) times daily as needed for cough.   rosuvastatin (CRESTOR) 10 MG tablet Take 1 tablet (10 mg total) by mouth daily.   No facility-administered encounter medications on file as of 05/10/2022.    Surgical History: Past Surgical History:  Procedure Laterality Date   ANKLE SURGERY Right     Medical History: Past Medical History:  Diagnosis Date   Elevated ferritin    Headache    Hemochromatosis    Hyperlipidemia    Hyponatremia    Medical history non-contributory    Shoulder injury      Family History: Family History  Problem Relation Age of Onset   Liver disease Other    Stroke Mother    Breast cancer Neg Hx     Social History   Socioeconomic History   Marital status: Married    Spouse name: Not on file   Number of children: Not on file   Years of education: Not on file   Highest education level: Not on file  Occupational History   Not on file  Tobacco Use   Smoking status: Never   Smokeless tobacco: Never  Vaping Use   Vaping Use: Never used  Substance and Sexual Activity   Alcohol use: No    Alcohol/week: 0.0 standard drinks of alcohol   Drug use: No   Sexual activity: Yes    Partners: Male  Other Topics Concern   Not on file  Social History Narrative   Not on file   Social Determinants of Health   Financial Resource Strain: Not on file  Food Insecurity: Not on file  Transportation Needs: Not on file  Physical Activity: Not on file  Stress: Not on file  Social Connections: Not on file  Intimate Partner Violence: Not on file      Review of Systems  Constitutional:  Negative for chills, fatigue and unexpected weight change.  HENT:  Negative for congestion, postnasal drip, rhinorrhea, sneezing and sore throat.  Eyes:  Negative for redness.  Respiratory:  Negative for cough, chest tightness and shortness of breath.   Cardiovascular:  Negative for chest pain and palpitations.  Gastrointestinal:  Negative for abdominal pain, constipation, diarrhea, nausea and vomiting.  Genitourinary:  Negative for dysuria and frequency.  Musculoskeletal:  Positive for arthralgias and gait problem. Negative for back pain, joint swelling and neck pain.  Skin:  Negative for rash.  Neurological:  Negative for tremors and numbness.  Hematological:  Negative for adenopathy. Does not bruise/bleed easily.  Psychiatric/Behavioral:  Negative for behavioral problems (Depression), sleep disturbance and suicidal ideas. The patient is not nervous/anxious.      Vital Signs: BP 126/68   Pulse 65   Temp 98.7 F (37.1 C)   Resp 16   Ht 5\' 5"  (1.651 m)   Wt 146 lb 3.2 oz (66.3 kg)   LMP  (LMP Unknown)   SpO2 97%   BMI 24.33 kg/m    Physical Exam Vitals and nursing note reviewed.  Constitutional:      General: She is not in acute distress.    Appearance: She is well-developed and normal weight. She is not diaphoretic.  HENT:     Head: Normocephalic and atraumatic.     Mouth/Throat:     Pharynx: No oropharyngeal exudate.  Eyes:     Pupils: Pupils are equal, round, and reactive to light.  Neck:     Thyroid: No thyromegaly.     Vascular: No JVD.     Trachea: No tracheal deviation.  Cardiovascular:     Rate and Rhythm: Normal rate and regular rhythm.     Heart sounds: Normal heart sounds. No murmur heard.    No friction rub. No gallop.  Pulmonary:     Effort: Pulmonary effort is normal. No respiratory distress.     Breath sounds: No wheezing or rales.  Chest:     Chest wall: No tenderness.  Abdominal:     General: Bowel sounds are normal.     Palpations: Abdomen is soft.  Musculoskeletal:        General: Tenderness present. Normal range of motion.     Cervical back: Normal range of motion and neck supple.     Right lower leg: No edema.     Left lower leg: No edema.     Comments: Tenderness of right knee, posteriorly  Lymphadenopathy:     Cervical: No cervical adenopathy.  Skin:    General: Skin is warm and dry.  Neurological:     Mental Status: She is alert and oriented to person, place, and time.     Cranial Nerves: No cranial nerve deficit.     Gait: Gait abnormal.  Psychiatric:        Behavior: Behavior normal.        Thought Content: Thought content normal.        Judgment: Judgment normal.        Assessment/Plan: 1. Prediabetes - POCT HgB A1C is 6.1 which is improved from 6.3 last visit. Continue to work on improving diet and exercise  2. Posterior right knee pain Followed by ortho  3. Mixed  hyperlipidemia Continue crestor and will update labs - Lipid Panel With LDL/HDL Ratio  4. Vitamin D deficiency - VITAMIN D 25 Hydroxy (Vit-D Deficiency, Fractures)  5. Other fatigue - TSH + free T4 - Lipid Panel With LDL/HDL Ratio - VITAMIN D 25 Hydroxy (Vit-D Deficiency, Fractures)   General Counseling: Lashala verbalizes understanding of the findings of  todays visit and agrees with plan of treatment. I have discussed any further diagnostic evaluation that may be needed or ordered today. We also reviewed her medications today. she has been encouraged to call the office with any questions or concerns that should arise related to todays visit.    Orders Placed This Encounter  Procedures   TSH + free T4   Lipid Panel With LDL/HDL Ratio   VITAMIN D 25 Hydroxy (Vit-D Deficiency, Fractures)   POCT HgB A1C    No orders of the defined types were placed in this encounter.   This patient was seen by Drema Dallas, PA-C in collaboration with Dr. Clayborn Bigness as a part of collaborative care agreement.   Total time spent:30 Minutes Time spent includes review of chart, medications, test results, and follow up plan with the patient.      Dr Lavera Guise Internal medicine

## 2022-05-14 ENCOUNTER — Ambulatory Visit
Admission: RE | Admit: 2022-05-14 | Discharge: 2022-05-14 | Disposition: A | Discharge status: Home / Self Care | Payer: Medicare Other | Source: Ambulatory Visit | Attending: Oncology | Admitting: Oncology

## 2022-05-14 DIAGNOSIS — R7989 Other specified abnormal findings of blood chemistry: Secondary | ICD-10-CM | POA: Diagnosis present

## 2022-05-21 ENCOUNTER — Encounter: Payer: Self-pay | Admitting: Oncology

## 2022-05-21 ENCOUNTER — Inpatient Hospital Stay: Payer: Medicare Other | Attending: Oncology

## 2022-05-21 ENCOUNTER — Inpatient Hospital Stay (HOSPITAL_BASED_OUTPATIENT_CLINIC_OR_DEPARTMENT_OTHER): Payer: Medicare Other | Admitting: Oncology

## 2022-05-21 DIAGNOSIS — R7989 Other specified abnormal findings of blood chemistry: Secondary | ICD-10-CM

## 2022-05-21 DIAGNOSIS — Z79899 Other long term (current) drug therapy: Secondary | ICD-10-CM | POA: Diagnosis not present

## 2022-05-21 LAB — CBC WITH DIFFERENTIAL/PLATELET
Abs Immature Granulocytes: 0.01 10*3/uL (ref 0.00–0.07)
Basophils Absolute: 0 10*3/uL (ref 0.0–0.1)
Basophils Relative: 0 %
Eosinophils Absolute: 0.2 10*3/uL (ref 0.0–0.5)
Eosinophils Relative: 4 %
HCT: 46.5 % — ABNORMAL HIGH (ref 36.0–46.0)
Hemoglobin: 15.9 g/dL — ABNORMAL HIGH (ref 12.0–15.0)
Immature Granulocytes: 0 %
Lymphocytes Relative: 48 %
Lymphs Abs: 2.5 10*3/uL (ref 0.7–4.0)
MCH: 31.1 pg (ref 26.0–34.0)
MCHC: 34.2 g/dL (ref 30.0–36.0)
MCV: 91 fL (ref 80.0–100.0)
Monocytes Absolute: 0.3 10*3/uL (ref 0.1–1.0)
Monocytes Relative: 5 %
Neutro Abs: 2.2 10*3/uL (ref 1.7–7.7)
Neutrophils Relative %: 43 %
Platelets: 169 10*3/uL (ref 150–400)
RBC: 5.11 MIL/uL (ref 3.87–5.11)
RDW: 12 % (ref 11.5–15.5)
WBC: 5.2 10*3/uL (ref 4.0–10.5)
nRBC: 0 % (ref 0.0–0.2)

## 2022-05-21 LAB — COMPREHENSIVE METABOLIC PANEL
ALT: 19 U/L (ref 0–44)
AST: 26 U/L (ref 15–41)
Albumin: 4.3 g/dL (ref 3.5–5.0)
Alkaline Phosphatase: 92 U/L (ref 38–126)
Anion gap: 7 (ref 5–15)
BUN: 13 mg/dL (ref 8–23)
CO2: 28 mmol/L (ref 22–32)
Calcium: 9.4 mg/dL (ref 8.9–10.3)
Chloride: 104 mmol/L (ref 98–111)
Creatinine, Ser: 0.82 mg/dL (ref 0.44–1.00)
GFR, Estimated: >60 mL/min (ref >60)
Glucose, Bld: 95 mg/dL (ref 70–99)
Potassium: 3.8 mmol/L (ref 3.5–5.1)
Sodium: 139 mmol/L (ref 135–145)
Total Bilirubin: 0.8 mg/dL (ref 0.3–1.2)
Total Protein: 7.5 g/dL (ref 6.5–8.1)

## 2022-05-21 LAB — FERRITIN: Ferritin: 394 ng/mL — ABNORMAL HIGH (ref 11–307)

## 2022-05-21 LAB — IRON AND TIBC
Iron: 140 ug/dL (ref 28–170)
Saturation Ratios: 38 % — ABNORMAL HIGH (ref 10.4–31.8)
TIBC: 365 ug/dL (ref 250–450)
UIBC: 225 ug/dL

## 2022-05-21 NOTE — Progress Notes (Signed)
Pt slipped about a month ago and states her rt leg still causes pain. When standing for to long causes pain; not able to squat down; going from sitting to standing causes pain and lifting the leg up while putting pants on. Has been using a cane to get around.

## 2022-05-21 NOTE — Progress Notes (Signed)
Hematology/Oncology Consult note [facility]  Telephone:([phone] [phone]  Patient Care Team: Carolynne Edouard as PCP - General (Physician Assistant)   Name of the patient: Tammy Lam  [id number]  14-Jun-1956   Date of visit: 05/21/22  Diagnosis- heterozygosity for H63D mutation Elevated ferritin of unclear etiology  Chief complaint/ Reason for visit-routine follow-up of elevated ferritin  Heme/Onc history: Patient is a 66 year old [place] female.  History obtained with the help of [place] interpreter.  She was diagnosed with heterozygosity for H63D back in April 2017.  She has had mildly elevated ferritin that fluctuates between 400-500 since then.  Iron saturation is mainly remain between 30 to 45% but never higher.  Right upper quadrant ultrasound in June 2020 showed possible fatty infiltration with potential 3.1 cm mass lesion versus area of focal sparing.  She had a liver MRI in July 2020 which showed moderate to severe hepatic steatosis.  Focal fatty sparing in the central left lobe corresponding to the lesion seen on ultrasound.  No evidence of abnormal iron deposition was seen.  Hepatitis B and C serologies were negative in May 2020.  AFP has been within normal limits so far.  She was seen again in April 2022 and underwent 1 session of phlebotomy.  She is a nonalcoholic.  Seen by GI Dr. Vicente Males    Interval history-history obtained with the help of [place] interpreter.  Overall patient is doing well and denies any new complaints at this time.  ECOG PS- 1 Pain scale- 0  Review of systems- Review of Systems  Constitutional:  Negative for chills, fever, malaise/fatigue and weight loss.  HENT:  Negative for congestion, ear discharge and nosebleeds.   Eyes:  Negative for blurred vision.  Respiratory:  Negative for cough, hemoptysis, sputum production, shortness of breath and wheezing.   Cardiovascular:  Negative for chest pain,  palpitations, orthopnea and claudication.  Gastrointestinal:  Negative for abdominal pain, blood in stool, constipation, diarrhea, heartburn, melena, nausea and vomiting.  Genitourinary:  Negative for dysuria, flank pain, frequency, hematuria and urgency.  Musculoskeletal:  Negative for back pain, joint pain and myalgias.  Skin:  Negative for rash.  Neurological:  Negative for dizziness, tingling, focal weakness, seizures, weakness and headaches.  Endo/Heme/Allergies:  Does not bruise/bleed easily.  Psychiatric/Behavioral:  Negative for depression and suicidal ideas. The patient does not have insomnia.       Allergies  Allergen Reactions   Diphenhydramine Other (See Comments)   Benadryl [Diphenhydramine Hcl] Itching   Influenza Vaccines    Penicillins Itching   Tricor [Fenofibrate]      Past Medical History:  Diagnosis Date   Elevated ferritin    Headache    Hemochromatosis    Hyperlipidemia    Hyponatremia    Medical history non-contributory    Shoulder injury      Past Surgical History:  Procedure Laterality Date   ANKLE SURGERY Right     Social History   Socioeconomic History   Marital status: Married    Spouse name: Not on file   Number of children: Not on file   Years of education: Not on file   Highest education level: Not on file  Occupational History   Not on file  Tobacco Use   Smoking status: Never   Smokeless tobacco: Never  Vaping Use   Vaping Use: Never used  Substance and Sexual Activity   Alcohol use: No    Alcohol/week: 0.0 standard drinks of alcohol  Drug use: No   Sexual activity: Yes    Partners: Male  Other Topics Concern   Not on file  Social History Narrative   Not on file   Social Determinants of Health   Financial Resource Strain: Not on file  Food Insecurity: Not on file  Transportation Needs: Not on file  Physical Activity: Not on file  Stress: Not on file  Social Connections: Not on file  Intimate Partner Violence:  Not on file    Family History  Problem Relation Age of Onset   Liver disease Other    Stroke Mother    Breast cancer Neg Hx      Current Outpatient Medications:    rosuvastatin (CRESTOR) 10 MG tablet, Take 1 tablet (10 mg total) by mouth daily., Disp: 90 tablet, Rfl: 3   benzonatate (TESSALON) 100 MG capsule, Take 1 capsule (100 mg total) by mouth 2 (two) times daily as needed for cough. (Patient not taking: Reported on 05/21/2022), Disp: 20 capsule, Rfl: 0   meloxicam (MOBIC) 15 MG tablet, Take 1 tablet every day by oral route with meals. (Patient not taking: Reported on 05/21/2022), Disp: , Rfl:   Physical exam:  Vitals:   05/21/22 1026  BP: 139/77  Pulse: 66  Resp: 16  Temp: (!) 86.7 F (30.4 C)  SpO2: 99%  Weight: 145 lb 4.8 oz (65.9 kg)   Physical Exam Constitutional:      General: She is not in acute distress. Cardiovascular:     Rate and Rhythm: Normal rate and regular rhythm.     Heart sounds: Normal heart sounds.  Pulmonary:     Effort: Pulmonary effort is normal.     Breath sounds: Normal breath sounds.  Abdominal:     General: Bowel sounds are normal.     Palpations: Abdomen is soft.  Skin:    General: Skin is warm and dry.  Neurological:     Mental Status: She is alert and oriented to person, place, and time.         Latest Ref Rng & Units 05/21/2022   10:06 AM  CMP  Glucose 70 - 99 mg/dL 95   BUN 8 - 23 mg/dL 13   Creatinine 0.44 - 1.00 mg/dL 0.82   Sodium 135 - 145 mmol/L 139   Potassium 3.5 - 5.1 mmol/L 3.8   Chloride 98 - 111 mmol/L 104   CO2 22 - 32 mmol/L 28   Calcium 8.9 - 10.3 mg/dL 9.4   Total Protein 6.5 - 8.1 g/dL 7.5   Total Bilirubin 0.3 - 1.2 mg/dL 0.8   Alkaline Phos 38 - 126 U/L 92   AST 15 - 41 U/L 26   ALT 0 - 44 U/L 19       Latest Ref Rng & Units 05/21/2022   10:06 AM  CBC  WBC 4.0 - 10.5 K/uL 5.2   Hemoglobin 12.0 - 15.0 g/dL 15.9   Hematocrit 36.0 - 46.0 % 46.5   Platelets 150 - 400 K/uL 169     No images are  attached to the encounter.  US ABDOMEN LIMITED RUQ (LIVER/GB)  Result Date: 05/14/2022 CLINICAL DATA:  Elevated ferritin.  Hemochromatosis. EXAM: ULTRASOUND ABDOMEN LIMITED RIGHT UPPER QUADRANT COMPARISON:  Multiple abdominal ultrasounds since January 30, 2019. MRI of the abdomen February 21, 2019. FINDINGS: Gallbladder: No gallstones or wall thickening visualized. No sonographic Murphy sign noted by sonographer. Common bile duct: Diameter: 3 mm Liver: Diffuse increased echogenicity. The 3 cm masslike  region of hypoechogenicity in the left hepatic lobe has been previously evaluated with MRI and noted to represent focal fatty sparing. Portal vein is patent on color Doppler imaging with normal direction of blood flow towards the liver. Other: None. IMPRESSION: Known hepatic steatosis with focal fatty sparing in the left hepatic lobe, previously evaluated with MRI. No interval changes or acute abnormalities. Electronically Signed   By: Dorise Bullion III M.D.   On: 05/14/2022 17:43     Assessment and plan- Patient is a 66 y.o. female who is here for follow-up of elevatedFerritin  Patient has heterozygosity for H63D will does not elliptically lead to iron overload.  Patient has mildly elevated levels of ferritin which is remained in the 300s to 400s range over the last 1 year.  Today her ferritin is 394.  LFT's are within normal limits.  Iron saturation 38%.  I am not planning to do a phlebotomy at this time.  Her ultrasound abdomen also did not show any evidence of liver masses.  Known fatty liver with focal fatty sparing in the left hepatic lobe.  I will repeat CBC ferritin and CMP in 6 months in 1 year and see her back in 1 year with ultrasound prior   Visit Diagnosis 1. 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 Elevated ferritin      Dr. Randa Evens, MD, MPH [facility] [id number] 05/21/2022 3:41 PM

## 2022-05-22 LAB — AFP TUMOR MARKER: AFP, Serum, Tumor Marker: 5.4 ng/mL (ref 0.0–9.2)

## 2022-08-02 ENCOUNTER — Ambulatory Visit: Payer: Medicare Other | Admitting: Physician Assistant

## 2022-08-05 LAB — LIPID PANEL WITH LDL/HDL RATIO
Cholesterol, Total: 232 mg/dL — ABNORMAL HIGH (ref 100–199)
HDL: 45 mg/dL (ref >39)
LDL Chol Calc (NIH): 94 mg/dL (ref 0–99)
LDL/HDL Ratio: 2.1 ratio (ref 0.0–3.2)
Triglycerides: 564 mg/dL (ref 0–149)
VLDL Cholesterol Cal: 93 mg/dL — ABNORMAL HIGH (ref 5–40)

## 2022-08-05 LAB — TSH+FREE T4
Free T4: 1.11 ng/dL (ref 0.82–1.77)
TSH: 6.06 u[IU]/mL — ABNORMAL HIGH (ref 0.450–4.500)

## 2022-08-05 LAB — VITAMIN D 25 HYDROXY (VIT D DEFICIENCY, FRACTURES): Vit D, 25-Hydroxy: 15.7 ng/mL — ABNORMAL LOW (ref 30.0–100.0)

## 2022-08-09 ENCOUNTER — Ambulatory Visit: Payer: Medicare Other | Admitting: Physician Assistant

## 2022-08-11 ENCOUNTER — Ambulatory Visit: Payer: Medicare Other | Admitting: Nurse Practitioner

## 2022-08-12 ENCOUNTER — Encounter: Payer: Self-pay | Admitting: Nurse Practitioner

## 2022-08-19 ENCOUNTER — Encounter: Payer: Self-pay | Admitting: Physician Assistant

## 2022-08-19 ENCOUNTER — Ambulatory Visit (INDEPENDENT_AMBULATORY_CARE_PROVIDER_SITE_OTHER): Payer: Medicare Other | Admitting: Physician Assistant

## 2022-08-19 VITALS — BP 128/83 | HR 68 | Temp 97.6°F | Resp 16 | Ht 65.0 in | Wt 150.4 lb

## 2022-08-19 DIAGNOSIS — R7989 Other specified abnormal findings of blood chemistry: Secondary | ICD-10-CM

## 2022-08-19 DIAGNOSIS — E119 Type 2 diabetes mellitus without complications: Secondary | ICD-10-CM

## 2022-08-19 DIAGNOSIS — Z1231 Encounter for screening mammogram for malignant neoplasm of breast: Secondary | ICD-10-CM

## 2022-08-19 DIAGNOSIS — Z0001 Encounter for general adult medical examination with abnormal findings: Secondary | ICD-10-CM

## 2022-08-19 DIAGNOSIS — E559 Vitamin D deficiency, unspecified: Secondary | ICD-10-CM

## 2022-08-19 DIAGNOSIS — E782 Mixed hyperlipidemia: Secondary | ICD-10-CM | POA: Diagnosis not present

## 2022-08-19 DIAGNOSIS — R300 Dysuria: Secondary | ICD-10-CM

## 2022-08-19 LAB — POCT GLYCOSYLATED HEMOGLOBIN (HGB A1C): Hemoglobin A1C: 6.5 % — AB (ref 4.0–5.6)

## 2022-08-19 MED ORDER — ROSUVASTATIN CALCIUM 20 MG PO TABS: 20.0000 mg | ORAL_TABLET | Freq: Every day | ORAL | 3 refills | Status: DC

## 2022-08-19 MED ORDER — ERGOCALCIFEROL 1.25 MG (50000 UT) PO CAPS: ORAL_CAPSULE | ORAL | 3 refills | Status: DC

## 2022-08-19 NOTE — Progress Notes (Signed)
[facility] [address] [place], [place] [address]  Internal MEDICINE  Office Visit Note  Patient Name: Tammy Lam  [id number]  [id number]  Date of Service: 08/19/2022  Chief Complaint  Patient presents with   Medicare Wellness   Hyperlipidemia     HPI Pt is here for routine health maintenance examination -Labs reviewed showing increased cholesterol again, will increase crestor and monitor labs. TSH also elevated and will check [place] and recheck labs. Vit D low and will supplement -Still having right knee pain is following up with ortho -Due for mammogram in March -Followed by hematology for hemachromatosis and previously followed by GI for fatty liver, but has not had visit in a year and may schedule now  Current Medication: Outpatient Encounter Medications as of 08/19/2022  Medication Sig   benzonatate (TESSALON) 100 MG capsule Take 1 capsule (100 mg total) by mouth 2 (two) times daily as needed for cough.   ergocalciferol (DRISDOL) 1.25 MG (50000 UT) capsule Take one cap q week   meloxicam (MOBIC) 15 MG tablet    rosuvastatin (CRESTOR) 20 MG tablet Take 1 tablet (20 mg total) by mouth daily.   [DISCONTINUED] rosuvastatin (CRESTOR) 10 MG tablet Take 1 tablet (10 mg total) by mouth daily.   No facility-administered encounter medications on file as of 08/19/2022.    Surgical History: Past Surgical History:  Procedure Laterality Date   ANKLE SURGERY Right     Medical History: Past Medical History:  Diagnosis Date   Elevated ferritin    Headache    Hemochromatosis    Hyperlipidemia    Hyponatremia    Medical history non-contributory    Shoulder injury     Family History: Family History  Problem Relation Age of Onset   Liver disease Other    Stroke Mother    Breast cancer Neg Hx       Review of Systems  Constitutional:  Negative for chills, fatigue and unexpected weight change.  HENT:  Negative for congestion, postnasal drip, rhinorrhea,  sneezing and sore throat.   Eyes:  Negative for redness.  Respiratory:  Negative for cough, chest tightness and shortness of breath.   Cardiovascular:  Negative for chest pain and palpitations.  Gastrointestinal:  Negative for abdominal pain, constipation, diarrhea, nausea and vomiting.  Genitourinary:  Negative for dysuria and frequency.  Musculoskeletal:  Positive for arthralgias and gait problem. Negative for back pain, joint swelling and neck pain.  Skin:  Negative for rash.  Neurological:  Negative for tremors and numbness.  Hematological:  Negative for adenopathy. Does not bruise/bleed easily.  Psychiatric/Behavioral:  Negative for behavioral problems (Depression), sleep disturbance and suicidal ideas. The patient is not nervous/anxious.      Vital Signs: BP 128/83   Pulse 68   Temp 97.6 F (36.4 C)   Resp 16   Ht 5\' 5"  (1.651 m)   Wt 150 lb 6.4 oz (68.2 kg)   LMP  (LMP Unknown)   SpO2 96%   BMI 25.03 kg/m    Physical Exam Vitals and nursing note reviewed.  Constitutional:      General: She is not in acute distress.    Appearance: She is well-developed and normal weight. She is not diaphoretic.  HENT:     Head: Normocephalic and atraumatic.     Mouth/Throat:     Pharynx: No oropharyngeal exudate.  Eyes:     Pupils: Pupils are equal, round, and reactive to light.  Neck:     Thyroid: No  thyromegaly.     Vascular: No JVD.     Trachea: No tracheal deviation.  Cardiovascular:     Rate and Rhythm: Normal rate and regular rhythm.     Heart sounds: Normal heart sounds. No murmur heard.    No friction rub. No gallop.  Pulmonary:     Effort: Pulmonary effort is normal. No respiratory distress.     Breath sounds: No wheezing or rales.  Chest:     Chest wall: No tenderness.  Abdominal:     General: Bowel sounds are normal.     Palpations: Abdomen is soft.     Tenderness: There is no abdominal tenderness.  Musculoskeletal:        General: Normal range of motion.      Cervical back: Normal range of motion and neck supple.     Right lower leg: No edema.     Left lower leg: No edema.  Lymphadenopathy:     Cervical: No cervical adenopathy.  Skin:    General: Skin is warm and dry.  Neurological:     Mental Status: She is alert and oriented to person, place, and time.     Cranial Nerves: No cranial nerve deficit.  Psychiatric:        Behavior: Behavior normal.        Thought Content: Thought content normal.        Judgment: Judgment normal.      LABS: Recent Results (from the past 2160 hour(s))  TSH + free T4     Status: Abnormal   Collection Time: 08/04/22  9:13 AM  Result Value Ref Range   TSH 6.060 (H) 0.450 - 4.500 uIU/mL   Free T4 1.11 0.82 - 1.77 ng/dL  Lipid Panel With LDL/HDL Ratio     Status: Abnormal   Collection Time: 08/04/22  9:13 AM  Result Value Ref Range   Cholesterol, Total 232 (H) 100 - 199 mg/dL   Triglycerides 829564 (HH) 0 - 149 mg/dL   HDL 45 >56>39 mg/dL   VLDL Cholesterol Cal 93 (H) 5 - 40 mg/dL   LDL Chol Calc (NIH) 94 0 - 99 mg/dL   LDL/HDL Ratio 2.1 0.0 - 3.2 ratio    Comment:                                     LDL/HDL Ratio                                             Men  Women                               1/2 Avg.Risk  1.0    1.5                                   Avg.Risk  3.6    3.2                                2X Avg.Risk  6.2    5.0  3X Avg.Risk  8.0    6.1   VITAMIN D 25 Hydroxy (Vit-D Deficiency, Fractures)     Status: Abnormal   Collection Time: 08/04/22  9:13 AM  Result Value Ref Range   Vit D, 25-Hydroxy 15.7 (L) 30.0 - 100.0 ng/mL    Comment: Vitamin D deficiency has been defined by the Institute of Medicine and an Endocrine Society practice guideline as a level of serum 25-OH vitamin D less than 20 ng/mL (1,2). The Endocrine Society went on to further define vitamin D insufficiency as a level between 21 and 29 ng/mL (2). 1. IOM (Institute of Medicine). 2010. Dietary  reference    intakes for calcium and D. [place]: The    [employer]. 2. Holick MF, Binkley [place], Bischoff-Ferrari HA, et al.    Evaluation, treatment, and prevention of vitamin D    deficiency: an Endocrine Society clinical practice    guideline. JCEM. 2011 Jul; 96(7):1911-30.   POCT HgB A1C     Status: Abnormal   Collection Time: 08/19/22 10:26 AM  Result Value Ref Range   Hemoglobin A1C 6.5 (A) 4.0 - 5.6 %   HbA1c POC (<> result, manual entry)     HbA1c, POC (prediabetic range)     HbA1c, POC (controlled diabetic range)          Assessment/Plan: 1. Encounter for general adult medical examination with abnormal findings Cpe performed, labs reviewed, UTD PHM  2. Mixed hyperlipidemia Will work to improve diet and exercise and will also increase to 20mg  crestor and recheck labs in 2-3 months - rosuvastatin (CRESTOR) 20 MG tablet; Take 1 tablet (20 mg total) by mouth daily.  Dispense: 90 tablet; Refill: 3 - Lipid Panel With LDL/HDL Ratio  3. Type 2 diabetes mellitus without complication, without long-term current use of insulin (HCC) - POCT HgB A1C is 6.5 which is increased from 6.1 last visit and now in diabetic range. Will work on diet and exercise and continue to monitor  4. Elevated TSH Will recheck in labs in 2-3 months and also check - TSH + free T4 - US THYROID; Future  5. Vitamin D deficiency - ergocalciferol (DRISDOL) 1.25 MG (50000 UT) capsule; Take one cap q week  Dispense: 12 capsule; Refill: 3  6. Visit for screening mammogram Due in march - MM 3D SCREEN BREAST BILATERAL; Future  7. Dysuria - UA/M w/rflx Culture, Routine   General Counseling: Meris verbalizes understanding of the findings of todays visit and agrees with plan of treatment. I have discussed any further diagnostic evaluation that may be needed or ordered today. We also reviewed her medications today. she has been encouraged to call the office with any questions or concerns  that should arise related to todays visit.    Counseling:    Orders Placed This Encounter  Procedures   April THYROID   MM 3D SCREEN BREAST BILATERAL   UA/M w/rflx Culture, Routine   TSH + free T4   Lipid Panel With LDL/HDL Ratio   POCT HgB A1C    Meds ordered this encounter  Medications   rosuvastatin (CRESTOR) 20 MG tablet    Sig: Take 1 tablet (20 mg total) by mouth daily.    Dispense:  90 tablet    Refill:  3   ergocalciferol (DRISDOL) 1.25 MG (50000 UT) capsule    Sig: Take one cap q week    Dispense:  12 capsule    Refill:  3    This patient was  seen by Lynn Ito, PA-C in collaboration with Dr. Beverely Risen as a part of collaborative care agreement.  Total time spent:35 Minutes  Time spent includes review of chart, medications, test results, and follow up plan with the patient.     Lyndon Code, MD  Internal Medicine

## 2022-08-20 LAB — UA/M W/RFLX CULTURE, ROUTINE
Bilirubin, UA: NEGATIVE
Glucose, UA: NEGATIVE
Ketones, UA: NEGATIVE
Leukocytes,UA: NEGATIVE
Nitrite, UA: NEGATIVE
Protein,UA: NEGATIVE
RBC, UA: NEGATIVE
Specific Gravity, UA: 1.019 (ref 1.005–1.030)
Urobilinogen, Ur: 0.2 mg/dL (ref 0.2–1.0)
pH, UA: 5.5 (ref 5.0–7.5)

## 2022-08-20 LAB — MICROSCOPIC EXAMINATION
Bacteria, UA: NONE SEEN
Casts: NONE SEEN /lpf
RBC, Urine: NONE SEEN /hpf (ref 0–2)
WBC, UA: NONE SEEN /hpf (ref 0–5)

## 2022-08-24 ENCOUNTER — Encounter: Payer: Self-pay | Admitting: Nurse Practitioner

## 2022-08-27 ENCOUNTER — Encounter: Payer: Self-pay | Admitting: Nurse Practitioner

## 2022-08-31 ENCOUNTER — Ambulatory Visit
Admission: RE | Admit: 2022-08-31 | Discharge: 2022-08-31 | Disposition: A | Discharge status: Home / Self Care | Payer: Medicare HMO | Source: Ambulatory Visit | Attending: Physician Assistant | Admitting: Physician Assistant

## 2022-08-31 DIAGNOSIS — R7989 Other specified abnormal findings of blood chemistry: Secondary | ICD-10-CM | POA: Insufficient documentation

## 2022-09-07 ENCOUNTER — Telehealth: Payer: Self-pay

## 2022-09-07 NOTE — Telephone Encounter (Signed)
-----  Message from Mylinda Latina, PA-C sent at 09/06/2022  4:50 PM EST ----- Please let her know that her thyroid US looked ok, a cyst was seen but does not require any follow up imaging.

## 2022-09-07 NOTE — Telephone Encounter (Signed)
LMOM for patient regarding thyroid US results.

## 2022-10-05 ENCOUNTER — Encounter: Payer: Self-pay | Admitting: Nurse Practitioner

## 2022-11-05 LAB — TSH+FREE T4
Free T4: 1.02 ng/dL (ref 0.82–1.77)
TSH: 6.49 u[IU]/mL — ABNORMAL HIGH (ref 0.450–4.500)

## 2022-11-05 LAB — LIPID PANEL WITH LDL/HDL RATIO
Cholesterol, Total: 188 mg/dL (ref 100–199)
HDL: 50 mg/dL (ref >39)
LDL Chol Calc (NIH): 79 mg/dL (ref 0–99)
LDL/HDL Ratio: 1.6 ratio (ref 0.0–3.2)
Triglycerides: 367 mg/dL — ABNORMAL HIGH (ref 0–149)
VLDL Cholesterol Cal: 59 mg/dL — ABNORMAL HIGH (ref 5–40)

## 2022-11-09 ENCOUNTER — Ambulatory Visit
Admission: RE | Admit: 2022-11-09 | Discharge: 2022-11-09 | Disposition: A | Discharge status: Home / Self Care | Payer: Medicare HMO | Source: Ambulatory Visit | Attending: Physician Assistant | Admitting: Physician Assistant

## 2022-11-09 DIAGNOSIS — Z1231 Encounter for screening mammogram for malignant neoplasm of breast: Secondary | ICD-10-CM | POA: Insufficient documentation

## 2022-11-15 ENCOUNTER — Encounter: Payer: Self-pay | Admitting: Nurse Practitioner

## 2022-11-19 ENCOUNTER — Inpatient Hospital Stay: Payer: Medicare HMO | Attending: Oncology

## 2022-11-19 LAB — COMPREHENSIVE METABOLIC PANEL
ALT: 26 U/L (ref 0–44)
AST: 30 U/L (ref 15–41)
Albumin: 4.2 g/dL (ref 3.5–5.0)
Alkaline Phosphatase: 108 U/L (ref 38–126)
Anion gap: 6 (ref 5–15)
BUN: 16 mg/dL (ref 8–23)
CO2: 29 mmol/L (ref 22–32)
Calcium: 9.1 mg/dL (ref 8.9–10.3)
Chloride: 102 mmol/L (ref 98–111)
Creatinine, Ser: 0.74 mg/dL (ref 0.44–1.00)
GFR, Estimated: >60 mL/min (ref >60)
Glucose, Bld: 117 mg/dL — ABNORMAL HIGH (ref 70–99)
Potassium: 3.8 mmol/L (ref 3.5–5.1)
Sodium: 137 mmol/L (ref 135–145)
Total Bilirubin: 0.7 mg/dL (ref 0.3–1.2)
Total Protein: 7.2 g/dL (ref 6.5–8.1)

## 2022-11-19 LAB — CBC WITH DIFFERENTIAL/PLATELET
Abs Immature Granulocytes: 0.01 10*3/uL (ref 0.00–0.07)
Basophils Absolute: 0.1 10*3/uL (ref 0.0–0.1)
Basophils Relative: 1 %
Eosinophils Absolute: 0.3 10*3/uL (ref 0.0–0.5)
Eosinophils Relative: 5 %
HCT: 46.2 % — ABNORMAL HIGH (ref 36.0–46.0)
Hemoglobin: 15.6 g/dL — ABNORMAL HIGH (ref 12.0–15.0)
Immature Granulocytes: 0 %
Lymphocytes Relative: 44 %
Lymphs Abs: 2.4 10*3/uL (ref 0.7–4.0)
MCH: 31 pg (ref 26.0–34.0)
MCHC: 33.8 g/dL (ref 30.0–36.0)
MCV: 91.7 fL (ref 80.0–100.0)
Monocytes Absolute: 0.4 10*3/uL (ref 0.1–1.0)
Monocytes Relative: 7 %
Neutro Abs: 2.3 10*3/uL (ref 1.7–7.7)
Neutrophils Relative %: 43 %
Platelets: 158 10*3/uL (ref 150–400)
RBC: 5.04 MIL/uL (ref 3.87–5.11)
RDW: 11.9 % (ref 11.5–15.5)
WBC: 5.4 10*3/uL (ref 4.0–10.5)
nRBC: 0 % (ref 0.0–0.2)

## 2022-11-21 LAB — AFP TUMOR MARKER: AFP, Serum, Tumor Marker: 6.5 ng/mL (ref 0.0–9.2)

## 2022-11-22 ENCOUNTER — Ambulatory Visit (INDEPENDENT_AMBULATORY_CARE_PROVIDER_SITE_OTHER): Payer: Medicare HMO | Admitting: Physician Assistant

## 2022-11-22 ENCOUNTER — Encounter: Payer: Self-pay | Admitting: Physician Assistant

## 2022-11-22 VITALS — BP 130/80 | HR 69 | Temp 97.8°F | Resp 16 | Ht 65.0 in | Wt 153.0 lb

## 2022-11-22 DIAGNOSIS — E119 Type 2 diabetes mellitus without complications: Secondary | ICD-10-CM

## 2022-11-22 DIAGNOSIS — E782 Mixed hyperlipidemia: Secondary | ICD-10-CM | POA: Diagnosis not present

## 2022-11-22 DIAGNOSIS — R7989 Other specified abnormal findings of blood chemistry: Secondary | ICD-10-CM

## 2022-11-22 LAB — POCT GLYCOSYLATED HEMOGLOBIN (HGB A1C): Hemoglobin A1C: 7.1 % — AB (ref 4.0–5.6)

## 2022-11-22 MED ORDER — LANCETS MISC. MISC
1.0000 | Freq: Three times a day (TID) | 0 refills | Status: AC
Start: 2022-11-22 — End: 2022-12-22

## 2022-11-22 MED ORDER — LEVOTHYROXINE SODIUM 25 MCG PO TABS: 25.0000 ug | ORAL_TABLET | Freq: Every day | ORAL | 3 refills | Status: DC

## 2022-11-22 MED ORDER — BLOOD GLUCOSE TEST VI STRP
1.0000 | ORAL_STRIP | Freq: Three times a day (TID) | 0 refills | Status: AC
Start: 2022-11-22 — End: 2022-12-22

## 2022-11-22 MED ORDER — BLOOD GLUCOSE MONITORING SUPPL DEVI
1.0000 | Freq: Three times a day (TID) | 0 refills | Status: AC
Start: 2022-11-22 — End: ?

## 2022-11-22 MED ORDER — METFORMIN HCL 500 MG PO TABS
500.0000 mg | ORAL_TABLET | Freq: Every day | ORAL | 1 refills | Status: DC
Start: 2022-11-22 — End: 2023-07-06

## 2022-11-22 NOTE — Progress Notes (Signed)
[facility] [address] [place], [place] [address]  Internal MEDICINE  Office Visit Note  Patient Name: Tammy Lam  [id number]  [id number]  Date of Service: 12/01/2022  Chief Complaint  Patient presents with   Follow-up   Hyperlipidemia   Diabetes    HPI Pt is here for routine follow up with her husband -thyroid US showed small cyst that does not require follow up -TG improving, but still elevated.  crestor caused more muscle cramping so taking 1/2 tab -Tsh still elevated and rising, will start low dose synthroid as she is tired -mammogram normal -A1c rising, will start monitoring BG at home and start medication  Current Medication: Outpatient Encounter Medications as of 11/22/2022  Medication Sig   benzonatate (TESSALON) 100 MG capsule Take 1 capsule (100 mg total) by mouth 2 (two) times daily as needed for cough.   Blood Glucose Monitoring Suppl DEVI 1 each by Does not apply route in the morning, at noon, and at bedtime. May substitute to any manufacturer covered by patient's insurance.   ergocalciferol (DRISDOL) 1.25 MG (50000 UT) capsule Take one cap q week   Glucose Blood (BLOOD GLUCOSE TEST STRIPS) STRP 1 each by In Vitro route in the morning, at noon, and at bedtime. May substitute to any manufacturer covered by patient's insurance.   Lancets Misc. MISC 1 each by Does not apply route in the morning, at noon, and at bedtime. May substitute to any manufacturer covered by patient's insurance.   levothyroxine (SYNTHROID) 25 MCG tablet Take 1 tablet (25 mcg total) by mouth daily.   meloxicam (MOBIC) 15 MG tablet    metFORMIN (GLUCOPHAGE) 500 MG tablet Take 1 tablet (500 mg total) by mouth daily with breakfast.   rosuvastatin (CRESTOR) 20 MG tablet Take 1 tablet (20 mg total) by mouth daily.   No facility-administered encounter medications on file as of 11/22/2022.    Surgical History: Past Surgical History:  Procedure Laterality Date   ANKLE SURGERY Right      Medical History: Past Medical History:  Diagnosis Date   Diabetes mellitus without complication    Elevated ferritin    Headache    Hemochromatosis    Hyperlipidemia    Hyponatremia    Medical history non-contributory    Shoulder injury     Family History: Family History  Problem Relation Age of Onset   Liver disease Other    Stroke Mother    Breast cancer Neg Hx     Social History   Socioeconomic History   Marital status: Married    Spouse name: Not on file   Number of children: Not on file   Years of education: Not on file   Highest education level: Not on file  Occupational History   Not on file  Tobacco Use   Smoking status: Never   Smokeless tobacco: Never  Vaping Use   Vaping Use: Never used  Substance and Sexual Activity   Alcohol use: No    Alcohol/week: 0.0 standard drinks of alcohol   Drug use: No   Sexual activity: Yes    Partners: Male  Other Topics Concern   Not on file  Social History Narrative   Not on file   Social Determinants of Health   Financial Resource Strain: Not on file  Food Insecurity: Not on file  Transportation Needs: Not on file  Physical Activity: Not on file  Stress: Not on file  Social Connections: Not on file  Intimate Partner Violence: Not  on file      Review of Systems  Constitutional:  Negative for chills, fatigue and unexpected weight change.  HENT:  Negative for congestion, postnasal drip, rhinorrhea, sneezing and sore throat.   Eyes:  Negative for redness.  Respiratory:  Negative for cough, chest tightness and shortness of breath.   Cardiovascular:  Negative for chest pain and palpitations.  Gastrointestinal:  Negative for abdominal pain, constipation, diarrhea, nausea and vomiting.  Genitourinary:  Negative for dysuria and frequency.  Musculoskeletal:  Positive for arthralgias. Negative for back pain, joint swelling and neck pain.  Skin:  Negative for rash.  Neurological: Negative.  Negative for  tremors and numbness.  Hematological:  Negative for adenopathy. Does not bruise/bleed easily.  Psychiatric/Behavioral:  Negative for behavioral problems (Depression), sleep disturbance and suicidal ideas. The patient is not nervous/anxious.     Vital Signs: BP 130/80   Pulse 69   Temp 97.8 F (36.6 C)   Resp 16   Ht 5\' 5"  (1.651 m)   Wt 153 lb (69.4 kg)   LMP  (LMP Unknown)   SpO2 98%   BMI 25.46 kg/m    Physical Exam Vitals and nursing note reviewed.  Constitutional:      General: She is not in acute distress.    Appearance: She is well-developed and normal weight. She is not diaphoretic.  HENT:     Head: Normocephalic and atraumatic.     Mouth/Throat:     Pharynx: No oropharyngeal exudate.  Eyes:     Pupils: Pupils are equal, round, and reactive to light.  Neck:     Thyroid: No thyromegaly.     Vascular: No JVD.     Trachea: No tracheal deviation.  Cardiovascular:     Rate and Rhythm: Normal rate and regular rhythm.     Heart sounds: Normal heart sounds. No murmur heard.    No friction rub. No gallop.  Pulmonary:     Effort: Pulmonary effort is normal. No respiratory distress.     Breath sounds: No wheezing or rales.  Chest:     Chest wall: No tenderness.  Abdominal:     General: Bowel sounds are normal.     Palpations: Abdomen is soft.  Musculoskeletal:        General: Normal range of motion.     Cervical back: Normal range of motion and neck supple.     Right lower leg: No edema.     Left lower leg: No edema.  Lymphadenopathy:     Cervical: No cervical adenopathy.  Skin:    General: Skin is warm and dry.  Neurological:     Mental Status: She is alert and oriented to person, place, and time.     Cranial Nerves: No cranial nerve deficit.  Psychiatric:        Behavior: Behavior normal.        Thought Content: Thought content normal.        Judgment: Judgment normal.        Assessment/Plan: 1. Type 2 diabetes mellitus without complication, without  long-term current use of insulin - POCT HgB A1C is 7.1 which is elevated from 6.5 last visit and will start on 1 tab metformin daily. Will also start monitoring BG at home and will adjust meds as needed - Microalbumin / creatinine urine ratio - metFORMIN (GLUCOPHAGE) 500 MG tablet; Take 1 tablet (500 mg total) by mouth daily with breakfast.  Dispense: 90 tablet; Refill: 1 - Blood Glucose Monitoring Suppl DEVI;  1 each by Does not apply route in the morning, at noon, and at bedtime. May substitute to any manufacturer covered by patient's insurance.  Dispense: 1 each; Refill: 0 - Glucose Blood (BLOOD GLUCOSE TEST STRIPS) STRP; 1 each by In Vitro route in the morning, at noon, and at bedtime. May substitute to any manufacturer covered by patient's insurance.  Dispense: 100 strip; Refill: 0 - Lancets Misc. MISC; 1 each by Does not apply route in the morning, at noon, and at bedtime. May substitute to any manufacturer covered by patient's insurance.  Dispense: 100 each; Refill: 0  2. Elevated TSH Will start on low dose synthroid and will order updated labs next visit - levothyroxine (SYNTHROID) 25 MCG tablet; Take 1 tablet (25 mcg total) by mouth daily.  Dispense: 90 tablet; Refill: 3  3. Mixed hyperlipidemia Unable to tolerate 20mg  crestor so taking 1/2 tab and will work on diet and exercise   General Counseling: Tammy Lam verbalizes understanding of the findings of todays visit and agrees with plan of treatment. I have discussed any further diagnostic evaluation that may be needed or ordered today. We also reviewed her medications today. she has been encouraged to call the office with any questions or concerns that should arise related to todays visit.    Orders Placed This Encounter  Procedures   Microalbumin / creatinine urine ratio   POCT HgB A1C    Meds ordered this encounter  Medications   levothyroxine (SYNTHROID) 25 MCG tablet    Sig: Take 1 tablet (25 mcg total) by mouth daily.     Dispense:  90 tablet    Refill:  3   metFORMIN (GLUCOPHAGE) 500 MG tablet    Sig: Take 1 tablet (500 mg total) by mouth daily with breakfast.    Dispense:  90 tablet    Refill:  1   Blood Glucose Monitoring Suppl DEVI    Sig: 1 each by Does not apply route in the morning, at noon, and at bedtime. May substitute to any manufacturer covered by patient's insurance.    Dispense:  1 each    Refill:  0   Glucose Blood (BLOOD GLUCOSE TEST STRIPS) STRP    Sig: 1 each by In Vitro route in the morning, at noon, and at bedtime. May substitute to any manufacturer covered by patient's insurance.    Dispense:  100 strip    Refill:  0   Lancets Misc. MISC    Sig: 1 each by Does not apply route in the morning, at noon, and at bedtime. May substitute to any manufacturer covered by patient's insurance.    Dispense:  100 each    Refill:  0    This patient was seen by Lynn Ito, PA-C in collaboration with Dr. Beverely Risen as a part of collaborative care agreement.   Total time spent:30 Minutes Time spent includes review of chart, medications, test results, and follow up plan with the patient.      Dr Lyndon Code Internal medicine

## 2022-11-23 LAB — MICROALBUMIN / CREATININE URINE RATIO
Creatinine, Urine: 109.3 mg/dL
Microalb/Creat Ratio: 10 mg/g creat (ref 0–29)
Microalbumin, Urine: 10.5 ug/mL

## 2023-01-24 ENCOUNTER — Ambulatory Visit (INDEPENDENT_AMBULATORY_CARE_PROVIDER_SITE_OTHER): Payer: Medicare HMO | Admitting: Physician Assistant

## 2023-01-24 ENCOUNTER — Encounter: Payer: Self-pay | Admitting: Physician Assistant

## 2023-01-24 VITALS — BP 110/70 | HR 69 | Temp 98.2°F | Resp 16 | Ht 65.0 in | Wt 150.6 lb

## 2023-01-24 DIAGNOSIS — E782 Mixed hyperlipidemia: Secondary | ICD-10-CM | POA: Diagnosis not present

## 2023-01-24 DIAGNOSIS — R7989 Other specified abnormal findings of blood chemistry: Secondary | ICD-10-CM | POA: Diagnosis not present

## 2023-01-24 DIAGNOSIS — E119 Type 2 diabetes mellitus without complications: Secondary | ICD-10-CM | POA: Diagnosis not present

## 2023-01-24 NOTE — Progress Notes (Signed)
[facility] [address] [place], [place] [address]  Internal MEDICINE  Office Visit Note  Patient Name: Tammy Lam  [id number]  [id number]  Date of Service: 02/01/2023  Chief Complaint  Patient presents with   Follow-up   Diabetes   Hyperlipidemia    HPI Pt is here for routine follow up -Has not been checking sugar at home, wasn't sure how. Discussed procedure and patient may try watching online tutorial at home as well -tolerating metformin -taking full cholesterol pill now, tolerating 20mg  crestor ok -Due for repeat thyroid labs since initiating synthroid for elevated TSH  Current Medication: Outpatient Encounter Medications as of 01/24/2023  Medication Sig   benzonatate (TESSALON) 100 MG capsule Take 1 capsule (100 mg total) by mouth 2 (two) times daily as needed for cough.   Blood Glucose Monitoring Suppl DEVI 1 each by Does not apply route in the morning, at noon, and at bedtime. May substitute to any manufacturer covered by patient's insurance.   ergocalciferol (DRISDOL) 1.25 MG (50000 UT) capsule Take one cap q week   levothyroxine (SYNTHROID) 25 MCG tablet Take 1 tablet (25 mcg total) by mouth daily.   meloxicam (MOBIC) 15 MG tablet    metFORMIN (GLUCOPHAGE) 500 MG tablet Take 1 tablet (500 mg total) by mouth daily with breakfast.   rosuvastatin (CRESTOR) 20 MG tablet Take 1 tablet (20 mg total) by mouth daily.   No facility-administered encounter medications on file as of 01/24/2023.    Surgical History: Past Surgical History:  Procedure Laterality Date   ANKLE SURGERY Right     Medical History: Past Medical History:  Diagnosis Date   Diabetes mellitus without complication (HCC)    Elevated ferritin    Headache    Hemochromatosis    Hyperlipidemia    Hyponatremia    Medical history non-contributory    Shoulder injury     Family History: Family History  Problem Relation Age of Onset   Liver disease Other    Stroke Mother    Breast cancer  Neg Hx     Social History   Socioeconomic History   Marital status: Married    Spouse name: Not on file   Number of children: Not on file   Years of education: Not on file   Highest education level: Not on file  Occupational History   Not on file  Tobacco Use   Smoking status: Never   Smokeless tobacco: Never  Vaping Use   Vaping Use: Never used  Substance and Sexual Activity   Alcohol use: No    Alcohol/week: 0.0 standard drinks of alcohol   Drug use: No   Sexual activity: Yes    Partners: Male  Other Topics Concern   Not on file  Social History Narrative   Not on file   Social Determinants of Health   Financial Resource Strain: Not on file  Food Insecurity: Not on file  Transportation Needs: Not on file  Physical Activity: Not on file  Stress: Not on file  Social Connections: Not on file  Intimate Partner Violence: Not on file      Review of Systems  Constitutional:  Negative for chills, fatigue and unexpected weight change.  HENT:  Negative for congestion, postnasal drip, rhinorrhea, sneezing and sore throat.   Eyes:  Negative for redness.  Respiratory:  Negative for cough, chest tightness and shortness of breath.   Cardiovascular:  Negative for chest pain and palpitations.  Gastrointestinal:  Negative for abdominal pain, constipation,  diarrhea, nausea and vomiting.  Genitourinary:  Negative for dysuria and frequency.  Musculoskeletal:  Positive for arthralgias. Negative for back pain, joint swelling and neck pain.  Skin:  Negative for rash.  Neurological: Negative.  Negative for tremors and numbness.  Hematological:  Negative for adenopathy. Does not bruise/bleed easily.  Psychiatric/Behavioral:  Negative for behavioral problems (Depression), sleep disturbance and suicidal ideas. The patient is not nervous/anxious.     Vital Signs: BP 110/70   Pulse 69   Temp 98.2 F (36.8 C)   Resp 16   Ht 5\' 5"  (1.651 m)   Wt 150 lb 9.6 oz (68.3 kg)   LMP  (LMP  Unknown)   SpO2 96%   BMI 25.06 kg/m    Physical Exam Vitals and nursing note reviewed.  Constitutional:      General: She is not in acute distress.    Appearance: She is well-developed and normal weight. She is not diaphoretic.  HENT:     Head: Normocephalic and atraumatic.     Mouth/Throat:     Pharynx: No oropharyngeal exudate.  Eyes:     Pupils: Pupils are equal, round, and reactive to light.  Neck:     Thyroid: No thyromegaly.     Vascular: No JVD.     Trachea: No tracheal deviation.  Cardiovascular:     Rate and Rhythm: Normal rate and regular rhythm.     Heart sounds: Normal heart sounds. No murmur heard.    No friction rub. No gallop.  Pulmonary:     Effort: Pulmonary effort is normal. No respiratory distress.     Breath sounds: No wheezing or rales.  Chest:     Chest wall: No tenderness.  Abdominal:     General: Bowel sounds are normal.     Palpations: Abdomen is soft.  Musculoskeletal:        General: Normal range of motion.     Cervical back: Normal range of motion and neck supple.     Right lower leg: No edema.     Left lower leg: No edema.  Lymphadenopathy:     Cervical: No cervical adenopathy.  Skin:    General: Skin is warm and dry.  Neurological:     Mental Status: She is alert and oriented to person, place, and time.     Cranial Nerves: No cranial nerve deficit.  Psychiatric:        Behavior: Behavior normal.        Thought Content: Thought content normal.        Judgment: Judgment normal.        Assessment/Plan: 1. Elevated TSH Will recheck labs and adjust synthroid as indicated - TSH + free T4  2. Type 2 diabetes mellitus without complication, without long-term current use of insulin (HCC) Continue metformin and begin checking BG at home  3. Mixed hyperlipidemia Continue crestor as before   General Counseling: [name] verbalizes understanding of the findings of todays visit and agrees with plan of treatment. I have discussed any  further diagnostic evaluation that may be needed or ordered today. We also reviewed her medications today. she has been encouraged to call the office with any questions or concerns that should arise related to todays visit.    Orders Placed This Encounter  Procedures   TSH + free T4    No orders of the defined types were placed in this encounter.   This patient was seen by Lynn Ito, PA-C in collaboration with Dr. Beverely Risen  as a part of collaborative care agreement.   Total time spent:30 Minutes Time spent includes review of chart, medications, test results, and follow up plan with the patient.      Dr Lyndon Code Internal medicine

## 2023-01-25 LAB — TSH+FREE T4
Free T4: 1.12 ng/dL (ref 0.82–1.77)
TSH: 2.64 u[IU]/mL (ref 0.450–4.500)

## 2023-01-27 ENCOUNTER — Telehealth: Payer: Self-pay

## 2023-01-27 NOTE — Telephone Encounter (Signed)
-----   Message from Carlean Jews, PA-C sent at 01/26/2023  1:09 PM EDT ----- Please let patient know her thyroid level is back to normal and to continue current medication

## 2023-01-27 NOTE — Telephone Encounter (Signed)
Left message for patient regarding normal thyroid lab results.

## 2023-04-07 ENCOUNTER — Encounter: Payer: Self-pay | Admitting: Physician Assistant

## 2023-04-07 ENCOUNTER — Ambulatory Visit (INDEPENDENT_AMBULATORY_CARE_PROVIDER_SITE_OTHER): Payer: Medicare HMO | Admitting: Physician Assistant

## 2023-04-07 VITALS — BP 115/80 | HR 64 | Temp 97.7°F | Resp 16 | Ht 65.0 in | Wt 146.2 lb

## 2023-04-07 DIAGNOSIS — E119 Type 2 diabetes mellitus without complications: Secondary | ICD-10-CM | POA: Diagnosis not present

## 2023-04-07 DIAGNOSIS — E559 Vitamin D deficiency, unspecified: Secondary | ICD-10-CM | POA: Diagnosis not present

## 2023-04-07 DIAGNOSIS — E782 Mixed hyperlipidemia: Secondary | ICD-10-CM | POA: Diagnosis not present

## 2023-04-07 DIAGNOSIS — R7989 Other specified abnormal findings of blood chemistry: Secondary | ICD-10-CM | POA: Diagnosis not present

## 2023-04-07 DIAGNOSIS — R5383 Other fatigue: Secondary | ICD-10-CM

## 2023-04-07 LAB — POCT GLYCOSYLATED HEMOGLOBIN (HGB A1C): Hemoglobin A1C: 6.3 % — AB (ref 4.0–5.6)

## 2023-04-07 NOTE — Progress Notes (Signed)
[facility] [address] 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   Blood Glucose Monitoring Suppl DEVI 1 each by Does not apply route in the morning, at noon, and at bedtime. May substitute to any manufacturer covered by patient's insurance.   ergocalciferol (DRISDOL) 1.25 MG (50000 UT) capsule Take one cap q week   levothyroxine (SYNTHROID) 25 MCG tablet Take 1 tablet (25 mcg total) by mouth daily.   meloxicam (MOBIC) 15 MG tablet    metFORMIN (GLUCOPHAGE) 500 MG tablet Take 1 tablet (500 mg total) by mouth daily with breakfast.   rosuvastatin (CRESTOR) 20 MG tablet Take 1 tablet (20 mg total) by mouth daily.   No facility-administered encounter medications on file as of 04/07/2023.    Surgical History: Past Surgical History:  Procedure Laterality Date   ANKLE SURGERY Right     Medical History: Past Medical History:  Diagnosis Date   Diabetes mellitus without complication (HCC)    Elevated ferritin    Headache    Hemochromatosis    Hyperlipidemia    Hyponatremia    Medical history non-contributory    Shoulder injury     Family History: Family History   Problem Relation Age of Onset   Liver disease Other    Stroke Mother    Breast cancer Neg Hx     Social History   Socioeconomic History   Marital status: Married    Spouse name: Not on file   Number of children: Not on file   Years of education: Not on file   Highest education level: Not on file  Occupational History   Not on file  Tobacco Use   Smoking status: Never   Smokeless tobacco: Never  Vaping Use   Vaping status: Never Used  Substance and Sexual Activity   Alcohol use: No    Alcohol/week: 0.0 standard drinks of alcohol   Drug use: No   Sexual activity: Yes    Partners: Male  Other Topics Concern   Not on file  Social History Narrative   Not on file   Social Determinants of Health   Financial Resource Strain: Not on file  Food Insecurity: Not on file  Transportation Needs: Not on file  Physical Activity: Not on file  Stress: Not on file  Social Connections: Not on file  Intimate Partner Violence: Not on file      Review of Systems  Constitutional:  Negative for chills, fatigue and unexpected weight change.  HENT:  Negative for congestion, postnasal drip, rhinorrhea, sneezing and sore throat.   Eyes:  Negative for redness.  Respiratory:  Negative for cough, chest tightness and shortness of breath.  Cardiovascular:  Negative for chest pain and palpitations.  Gastrointestinal:  Negative for abdominal pain, constipation, diarrhea, nausea and vomiting.  Genitourinary:  Negative for dysuria and frequency.  Musculoskeletal:  Positive for arthralgias. Negative for back pain, joint swelling and neck pain.  Skin:  Negative for rash.  Neurological: Negative.  Negative for tremors and numbness.  Hematological:  Negative for adenopathy. Does not bruise/bleed easily.  Psychiatric/Behavioral:  Negative for behavioral problems (Depression), sleep disturbance and suicidal ideas. The patient is not nervous/anxious.     Vital Signs: BP 115/80   Pulse 64   Temp  97.7 F (36.5 C)   Resp 16   Ht 5\' 5"  (1.651 m)   Wt 146 lb 3.2 oz (66.3 kg)   LMP  (LMP Unknown)   SpO2 95%   BMI 24.33 kg/m    Physical Exam Vitals and nursing note reviewed.  Constitutional:      General: She is not in acute distress.    Appearance: She is well-developed and normal weight. She is not diaphoretic.  HENT:     Head: Normocephalic and atraumatic.     Mouth/Throat:     Pharynx: No oropharyngeal exudate.  Eyes:     Pupils: Pupils are equal, round, and reactive to light.  Neck:     Thyroid: No thyromegaly.     Vascular: No JVD.     Trachea: No tracheal deviation.  Cardiovascular:     Rate and Rhythm: Normal rate and regular rhythm.     Heart sounds: Normal heart sounds. No murmur heard.    No friction rub. No gallop.  Pulmonary:     Effort: Pulmonary effort is normal. No respiratory distress.     Breath sounds: No wheezing or rales.  Chest:     Chest wall: No tenderness.  Abdominal:     General: Bowel sounds are normal.     Palpations: Abdomen is soft.  Musculoskeletal:        General: Normal range of motion.     Cervical back: Normal range of motion and neck supple.     Right lower leg: No edema.     Left lower leg: No edema.  Lymphadenopathy:     Cervical: No cervical adenopathy.  Skin:    General: Skin is warm and dry.  Neurological:     Mental Status: She is alert and oriented to person, place, and time.     Cranial Nerves: No cranial nerve deficit.  Psychiatric:        Behavior: Behavior normal.        Thought Content: Thought content normal.        Judgment: Judgment normal.        Assessment/Plan: 1. Type 2 diabetes mellitus without complication, without long-term current use of insulin (HCC) - POCT HgB A1C is 6.3 which is improved from 7.1 last visit and will continue metformin as before  2. Mixed hyperlipidemia Continue crestor and will update labs - Lipid Panel With LDL/HDL Ratio  3. Elevated TSH Continue synthroid as before  and will recheck labs - TSH + free T4  4. Vitamin D deficiency - VITAMIN D 25 Hydroxy (Vit-D Deficiency, Fractures)  5. Other fatigue - TSH + free T4 - CBC w/Diff/Platelet - Comprehensive metabolic panel - VITAMIN D 25 Hydroxy (Vit-D Deficiency, Fractures) - Lipid Panel With LDL/HDL Ratio   General Counseling: Zriyah verbalizes understanding of the findings of todays visit and agrees with plan of treatment. I have discussed any further diagnostic evaluation  that may be needed or ordered today. We also reviewed her medications today. she has been encouraged to call the office with any questions or concerns that should arise related to todays visit.    Orders Placed This Encounter  Procedures   TSH + free T4   CBC w/Diff/Platelet   Comprehensive metabolic panel   VITAMIN D 25 Hydroxy (Vit-D Deficiency, Fractures)   Lipid Panel With LDL/HDL Ratio   POCT HgB A1C    No orders of the defined types were placed in this encounter.   This patient was seen by Lynn Ito, PA-C in collaboration with Dr. Beverely Risen as a part of collaborative care agreement.   Total time spent:30 Minutes Time spent includes review of chart, medications, test results, and follow up plan with the patient.      Dr Lyndon Code Internal medicine

## 2023-05-03 ENCOUNTER — Telehealth: Payer: Self-pay | Admitting: Physician Assistant

## 2023-05-03 NOTE — Telephone Encounter (Signed)
Lvm with husband to move 08/22/23 appt-Toni

## 2023-05-16 ENCOUNTER — Ambulatory Visit
Admission: RE | Admit: 2023-05-16 | Discharge: 2023-05-16 | Disposition: A | Discharge status: Home / Self Care | Payer: Medicare HMO | Source: Ambulatory Visit | Attending: Oncology | Admitting: Oncology

## 2023-05-18 ENCOUNTER — Inpatient Hospital Stay: Payer: Medicare HMO | Attending: Oncology

## 2023-05-18 ENCOUNTER — Other Ambulatory Visit: Payer: Self-pay | Admitting: *Deleted

## 2023-05-18 ENCOUNTER — Encounter: Payer: Self-pay | Admitting: Oncology

## 2023-05-18 ENCOUNTER — Inpatient Hospital Stay (HOSPITAL_BASED_OUTPATIENT_CLINIC_OR_DEPARTMENT_OTHER): Payer: Medicare HMO | Admitting: Oncology

## 2023-05-18 VITALS — BP 138/78 | HR 72 | Temp 99.7°F | Resp 18 | Ht 65.0 in | Wt 144.1 lb

## 2023-05-18 DIAGNOSIS — E119 Type 2 diabetes mellitus without complications: Secondary | ICD-10-CM | POA: Insufficient documentation

## 2023-05-18 DIAGNOSIS — R7989 Other specified abnormal findings of blood chemistry: Secondary | ICD-10-CM | POA: Insufficient documentation

## 2023-05-18 DIAGNOSIS — E785 Hyperlipidemia, unspecified: Secondary | ICD-10-CM | POA: Diagnosis not present

## 2023-05-18 DIAGNOSIS — Z148 Genetic carrier of other disease: Secondary | ICD-10-CM

## 2023-05-18 DIAGNOSIS — K760 Fatty (change of) liver, not elsewhere classified: Secondary | ICD-10-CM | POA: Insufficient documentation

## 2023-05-18 LAB — COMPREHENSIVE METABOLIC PANEL
ALT: 22 U/L (ref 0–44)
AST: 24 U/L (ref 15–41)
Albumin: 4.2 g/dL (ref 3.5–5.0)
Alkaline Phosphatase: 86 U/L (ref 38–126)
Anion gap: 8 (ref 5–15)
BUN: 15 mg/dL (ref 8–23)
CO2: 24 mmol/L (ref 22–32)
Calcium: 9.3 mg/dL (ref 8.9–10.3)
Chloride: 106 mmol/L (ref 98–111)
Creatinine, Ser: 0.8 mg/dL (ref 0.44–1.00)
GFR, Estimated: >60 mL/min (ref >60)
Glucose, Bld: 106 mg/dL — ABNORMAL HIGH (ref 70–99)
Potassium: 3.6 mmol/L (ref 3.5–5.1)
Sodium: 138 mmol/L (ref 135–145)
Total Bilirubin: 1 mg/dL (ref 0.3–1.2)
Total Protein: 7.2 g/dL (ref 6.5–8.1)

## 2023-05-18 LAB — CBC WITH DIFFERENTIAL/PLATELET
Abs Immature Granulocytes: 0.01 10*3/uL (ref 0.00–0.07)
Basophils Absolute: 0 10*3/uL (ref 0.0–0.1)
Basophils Relative: 1 %
Eosinophils Absolute: 0.2 10*3/uL (ref 0.0–0.5)
Eosinophils Relative: 4 %
HCT: 45.8 % (ref 36.0–46.0)
Hemoglobin: 15.2 g/dL — ABNORMAL HIGH (ref 12.0–15.0)
Immature Granulocytes: 0 %
Lymphocytes Relative: 45 %
Lymphs Abs: 2.2 10*3/uL (ref 0.7–4.0)
MCH: 30.6 pg (ref 26.0–34.0)
MCHC: 33.2 g/dL (ref 30.0–36.0)
MCV: 92.2 fL (ref 80.0–100.0)
Monocytes Absolute: 0.3 10*3/uL (ref 0.1–1.0)
Monocytes Relative: 6 %
Neutro Abs: 2.1 10*3/uL (ref 1.7–7.7)
Neutrophils Relative %: 44 %
Platelets: 155 10*3/uL (ref 150–400)
RBC: 4.97 MIL/uL (ref 3.87–5.11)
RDW: 12.4 % (ref 11.5–15.5)
WBC: 4.8 10*3/uL (ref 4.0–10.5)
nRBC: 0 % (ref 0.0–0.2)

## 2023-05-18 LAB — FERRITIN: Ferritin: 364 ng/mL — ABNORMAL HIGH (ref 11–307)

## 2023-05-18 NOTE — Progress Notes (Signed)
Hematology/Oncology Consult note [facility]  [phone] [phone]  Patient Care Team: Alan Ripper as PCP - General (Physician Assistant)   Name of the patient: Tammy Lam  [id number]  [birth year]   Date of visit: 05/18/23  Diagnosis- heterozygosity for H63D mutation Elevated ferritin of unclear etiology  Chief complaint/ Reason for visit-routine follow-up of elevated ferritin  Heme/Onc history:  Patient is a 67 year old [place] female.  History obtained with the help of [place] interpreter.  She was diagnosed with heterozygosity for H63D back in April 2017.  She has had mildly elevated ferritin that fluctuates between 400-500 since then.  Iron saturation is mainly remain between 30 to 45% but never higher.  Right upper quadrant ultrasound in June 2020 showed possible fatty infiltration with potential 3.1 cm mass lesion versus area of focal sparing.  She had a liver MRI in July 2020 which showed moderate to severe hepatic steatosis.  Focal fatty sparing in the central left lobe corresponding to the lesion seen on ultrasound.  No evidence of abnormal iron deposition was seen.  Hepatitis B and C serologies were negative in May 2020.  AFP has been within normal limits so far.  She was seen again in April 2022 and underwent 1 session of phlebotomy.  She is a nonalcoholic.  Seen by GI Dr. Tobi Bastos    Interval history-history obtained with the help of [place] interpreter.  Overall patient is doing well.  She has not had any hospitalizations.  Denies any abdominal pain nausea vomiting or diarrhea.  ECOG PS- 1 Pain scale- 0   Review of systems- Review of Systems  Constitutional:  Negative for chills, fever, malaise/fatigue and weight loss.  HENT:  Negative for congestion, ear discharge and nosebleeds.   Eyes:  Negative for blurred vision.  Respiratory:  Negative for cough, hemoptysis, sputum production, shortness of breath and  wheezing.   Cardiovascular:  Negative for chest pain, palpitations, orthopnea and claudication.  Gastrointestinal:  Negative for abdominal pain, blood in stool, constipation, diarrhea, heartburn, melena, nausea and vomiting.  Genitourinary:  Negative for dysuria, flank pain, frequency, hematuria and urgency.  Musculoskeletal:  Negative for back pain, joint pain and myalgias.  Skin:  Negative for rash.  Neurological:  Negative for dizziness, tingling, focal weakness, seizures, weakness and headaches.  Endo/Heme/Allergies:  Does not bruise/bleed easily.  Psychiatric/Behavioral:  Negative for depression and suicidal ideas. The patient does not have insomnia.       Allergies  Allergen Reactions   Diphenhydramine Other (See Comments)   Benadryl [Diphenhydramine Hcl] Itching   Influenza Vaccines    Penicillins Itching   Tricor [Fenofibrate]      Past Medical History:  Diagnosis Date   Diabetes mellitus without complication (HCC)    Elevated ferritin    Headache    Hemochromatosis    Hyperlipidemia    Hyponatremia    Medical history non-contributory    Shoulder injury      Past Surgical History:  Procedure Laterality Date   ANKLE SURGERY Right     Social History   Socioeconomic History   Marital status: Married    Spouse name: Not on file   Number of children: Not on file   Years of education: Not on file   Highest education level: Not on file  Occupational History   Not on file  Tobacco Use   Smoking status: Never   Smokeless tobacco: Never  Vaping Use   Vaping status: Never Used  Substance and Sexual Activity   Alcohol use: No    Alcohol/week: 0.0 standard drinks of alcohol   Drug use: No   Sexual activity: Yes    Partners: Male  Other Topics Concern   Not on file  Social History Narrative   Not on file   Social Determinants of Health   Financial Resource Strain: Not on file  Food Insecurity: Not on file  Transportation Needs: Not on file  Physical  Activity: Not on file  Stress: Not on file  Social Connections: Not on file  Intimate Partner Violence: Not on file    Family History  Problem Relation Age of Onset   Liver disease Other    Stroke Mother    Breast cancer Neg Hx      Current Outpatient Medications:    benzonatate (TESSALON) 100 MG capsule, Take 1 capsule (100 mg total) by mouth 2 (two) times daily as needed for cough., Disp: 20 capsule, Rfl: 0   Blood Glucose Monitoring Suppl DEVI, 1 each by Does not apply route in the morning, at noon, and at bedtime. May substitute to any manufacturer covered by patient's insurance., Disp: 1 each, Rfl: 0   levothyroxine (SYNTHROID) 25 MCG tablet, Take 1 tablet (25 mcg total) by mouth daily., Disp: 90 tablet, Rfl: 3   meloxicam (MOBIC) 15 MG tablet, , Disp: , Rfl:    metFORMIN (GLUCOPHAGE) 500 MG tablet, Take 1 tablet (500 mg total) by mouth daily with breakfast., Disp: 90 tablet, Rfl: 1   rosuvastatin (CRESTOR) 20 MG tablet, Take 1 tablet (20 mg total) by mouth daily., Disp: 90 tablet, Rfl: 3   ergocalciferol (DRISDOL) 1.25 MG (50000 UT) capsule, Take one cap q week, Disp: 12 capsule, Rfl: 3  Physical exam:  Vitals:   05/18/23 1010  BP: 138/78  Pulse: 72  Resp: 18  Temp: 99.7 F (37.6 C)  TempSrc: Tympanic  SpO2: 98%  Weight: 144 lb 1.6 oz (65.4 kg)  Height: 5\' 5"  (1.651 m)   Physical Exam Cardiovascular:     Rate and Rhythm: Normal rate and regular rhythm.     Heart sounds: Normal heart sounds.  Pulmonary:     Effort: Pulmonary effort is normal.     Breath sounds: Normal breath sounds.  Abdominal:     General: Bowel sounds are normal.     Palpations: Abdomen is soft.  Skin:    General: Skin is warm and dry.  Neurological:     Mental Status: She is alert and oriented to person, place, and time.         Latest Ref Rng & Units 05/18/2023    9:18 AM  CMP  Glucose 70 - 99 mg/dL 595   BUN 8 - 23 mg/dL 15   Creatinine 6.38 - 1.00 mg/dL 7.56   Sodium 433 - 295  mmol/L 138   Potassium 3.5 - 5.1 mmol/L 3.6   Chloride 98 - 111 mmol/L 106   CO2 22 - 32 mmol/L 24   Calcium 8.9 - 10.3 mg/dL 9.3   Total Protein 6.5 - 8.1 g/dL 7.2   Total Bilirubin 0.3 - 1.2 mg/dL 1.0   Alkaline Phos 38 - 126 U/L 86   AST 15 - 41 U/L 24   ALT 0 - 44 U/L 22       Latest Ref Rng & Units 05/18/2023    9:18 AM  CBC  WBC 4.0 - 10.5 K/uL 4.8   Hemoglobin 12.0 - 15.0 g/dL 15.2  Hematocrit 36.0 - 46.0 % 45.8   Platelets 150 - 400 K/uL 155     No images are attached to the encounter.  US ABDOMEN LIMITED RUQ (LIVER/GB)  Result Date: 05/16/2023 CLINICAL DATA:  Hemochromatosis EXAM: ULTRASOUND ABDOMEN LIMITED RIGHT UPPER QUADRANT COMPARISON:  Ultrasound abdomen 05/14/2022 FINDINGS: Gallbladder: No gallstones or wall thickening visualized. No sonographic Murphy sign noted by sonographer. Common bile duct: Diameter: 3.7 mm Liver: Increased parenchymal echogenicity. Redemonstrated hypoechoic region left hepatic lobe, previously demonstrated to represent fatty sparing on prior MRI. Additionally there is fatty sparing adjacent to the gallbladder fossa. Portal vein is patent on color Doppler imaging with normal direction of blood flow towards the liver. Other: None. IMPRESSION: 1. Increased hepatic parenchymal echogenicity suggestive of steatosis. 2. No cholelithiasis or sonographic evidence for acute cholecystitis. Electronically Signed   By: Annia Belt M.D.   On: 05/16/2023 13:15     Assessment and plan- Patient is a 67 y.o. female with history of heterozygosity for H63D here for routine follow-up  Heterozygosity for H63D does not lead to iron overload.  Patient's LFTs are normal and ultrasound shows fatty liver but no other abnormal findings.  Her ferritin levels have remained stable between 300s to 400 over the last couple of years.She does not require any phlebotomy at this time.  CBC with differential CMP and ferritin levels and I will see her back in 1 year   Visit  Diagnosis 1. Elevated ferritin      Dr. Owens Shark, MD, MPH [facility] [phone] 05/18/2023 12:24 PM

## 2023-05-19 LAB — AFP TUMOR MARKER: AFP, Serum, Tumor Marker: 5.7 ng/mL (ref 0.0–9.2)

## 2023-07-06 ENCOUNTER — Other Ambulatory Visit: Payer: Self-pay | Admitting: Physician Assistant

## 2023-07-06 DIAGNOSIS — E119 Type 2 diabetes mellitus without complications: Secondary | ICD-10-CM

## 2023-07-07 ENCOUNTER — Other Ambulatory Visit: Payer: Self-pay | Admitting: Physician Assistant

## 2023-07-07 DIAGNOSIS — E559 Vitamin D deficiency, unspecified: Secondary | ICD-10-CM

## 2023-07-31 ENCOUNTER — Other Ambulatory Visit: Payer: Self-pay | Admitting: Physician Assistant

## 2023-07-31 DIAGNOSIS — E782 Mixed hyperlipidemia: Secondary | ICD-10-CM

## 2023-08-02 DIAGNOSIS — E119 Type 2 diabetes mellitus without complications: Secondary | ICD-10-CM | POA: Diagnosis not present

## 2023-08-02 DIAGNOSIS — Z7984 Long term (current) use of oral hypoglycemic drugs: Secondary | ICD-10-CM | POA: Diagnosis not present

## 2023-08-02 DIAGNOSIS — E785 Hyperlipidemia, unspecified: Secondary | ICD-10-CM | POA: Diagnosis not present

## 2023-08-02 DIAGNOSIS — I10 Essential (primary) hypertension: Secondary | ICD-10-CM | POA: Diagnosis not present

## 2023-08-02 DIAGNOSIS — Z880 Allergy status to penicillin: Secondary | ICD-10-CM | POA: Diagnosis not present

## 2023-08-02 DIAGNOSIS — Z8249 Family history of ischemic heart disease and other diseases of the circulatory system: Secondary | ICD-10-CM | POA: Diagnosis not present

## 2023-08-02 DIAGNOSIS — Z818 Family history of other mental and behavioral disorders: Secondary | ICD-10-CM | POA: Diagnosis not present

## 2023-08-02 DIAGNOSIS — M810 Age-related osteoporosis without current pathological fracture: Secondary | ICD-10-CM | POA: Diagnosis not present

## 2023-08-02 DIAGNOSIS — E039 Hypothyroidism, unspecified: Secondary | ICD-10-CM | POA: Diagnosis not present

## 2023-08-02 DIAGNOSIS — Z823 Family history of stroke: Secondary | ICD-10-CM | POA: Diagnosis not present

## 2023-08-09 DIAGNOSIS — E782 Mixed hyperlipidemia: Secondary | ICD-10-CM | POA: Diagnosis not present

## 2023-08-09 DIAGNOSIS — E559 Vitamin D deficiency, unspecified: Secondary | ICD-10-CM | POA: Diagnosis not present

## 2023-08-09 DIAGNOSIS — R5383 Other fatigue: Secondary | ICD-10-CM | POA: Diagnosis not present

## 2023-08-09 DIAGNOSIS — R7989 Other specified abnormal findings of blood chemistry: Secondary | ICD-10-CM | POA: Diagnosis not present

## 2023-08-10 LAB — LIPID PANEL WITH LDL/HDL RATIO
Cholesterol, Total: 250 mg/dL — ABNORMAL HIGH (ref 100–199)
HDL: 46 mg/dL (ref >39)
LDL Chol Calc (NIH): 93 mg/dL (ref 0–99)
LDL/HDL Ratio: 2 {ratio} (ref 0.0–3.2)
Triglycerides: 664 mg/dL (ref 0–149)
VLDL Cholesterol Cal: 111 mg/dL — ABNORMAL HIGH (ref 5–40)

## 2023-08-10 LAB — CBC WITH DIFFERENTIAL/PLATELET

## 2023-08-10 LAB — COMPREHENSIVE METABOLIC PANEL
ALT: 17 [IU]/L (ref 0–32)
AST: 20 [IU]/L (ref 0–40)
Albumin: 4.3 g/dL (ref 3.9–4.9)
Alkaline Phosphatase: 110 [IU]/L (ref 44–121)
BUN/Creatinine Ratio: 13 (ref 12–28)
BUN: 11 mg/dL (ref 8–27)
Bilirubin Total: 0.6 mg/dL (ref 0.0–1.2)
CO2: 26 mmol/L (ref 20–29)
Calcium: 9.7 mg/dL (ref 8.7–10.3)
Chloride: 100 mmol/L (ref 96–106)
Creatinine, Ser: 0.84 mg/dL (ref 0.57–1.00)
Globulin, Total: 2.8 g/dL (ref 1.5–4.5)
Glucose: 87 mg/dL (ref 70–99)
Potassium: 4.4 mmol/L (ref 3.5–5.2)
Sodium: 141 mmol/L (ref 134–144)
Total Protein: 7.1 g/dL (ref 6.0–8.5)
eGFR: 76 mL/min/{1.73_m2} (ref >59)

## 2023-08-10 LAB — VITAMIN D 25 HYDROXY (VIT D DEFICIENCY, FRACTURES): Vit D, 25-Hydroxy: 25.7 ng/mL — ABNORMAL LOW (ref 30.0–100.0)

## 2023-08-10 LAB — TSH+FREE T4
Free T4: 1.02 ng/dL (ref 0.82–1.77)
TSH: 5.32 u[IU]/mL — ABNORMAL HIGH (ref 0.450–4.500)

## 2023-08-11 ENCOUNTER — Telehealth: Payer: Self-pay

## 2023-08-11 ENCOUNTER — Other Ambulatory Visit: Payer: Self-pay

## 2023-08-11 DIAGNOSIS — R5383 Other fatigue: Secondary | ICD-10-CM

## 2023-08-11 NOTE — Telephone Encounter (Signed)
We can add zetia 10 mg po at bedtime # 90 3 refills

## 2023-08-11 NOTE — Telephone Encounter (Signed)
Please confirm if this is a fasting sample

## 2023-08-11 NOTE — Telephone Encounter (Signed)
Lmom to call us back 

## 2023-08-12 ENCOUNTER — Telehealth: Payer: Self-pay | Admitting: Physician Assistant

## 2023-08-12 ENCOUNTER — Other Ambulatory Visit
Admission: RE | Admit: 2023-08-12 | Discharge: 2023-08-12 | Disposition: A | Discharge status: Home / Self Care | Payer: Medicare HMO | Source: Ambulatory Visit | Attending: Internal Medicine | Admitting: Internal Medicine

## 2023-08-12 DIAGNOSIS — R5383 Other fatigue: Secondary | ICD-10-CM | POA: Diagnosis not present

## 2023-08-12 LAB — CBC WITH DIFFERENTIAL/PLATELET
Abs Immature Granulocytes: 0.02 10*3/uL (ref 0.00–0.07)
Basophils Absolute: 0 10*3/uL (ref 0.0–0.1)
Basophils Relative: 1 %
Eosinophils Absolute: 0.4 10*3/uL (ref 0.0–0.5)
Eosinophils Relative: 6 %
HCT: 49.5 % — ABNORMAL HIGH (ref 36.0–46.0)
Hemoglobin: 16.5 g/dL — ABNORMAL HIGH (ref 12.0–15.0)
Immature Granulocytes: 0 %
Lymphocytes Relative: 51 %
Lymphs Abs: 2.8 10*3/uL (ref 0.7–4.0)
MCH: 31.1 pg (ref 26.0–34.0)
MCHC: 33.3 g/dL (ref 30.0–36.0)
MCV: 93.4 fL (ref 80.0–100.0)
Monocytes Absolute: 0.3 10*3/uL (ref 0.1–1.0)
Monocytes Relative: 6 %
Neutro Abs: 2 10*3/uL (ref 1.7–7.7)
Neutrophils Relative %: 36 %
Platelets: 158 10*3/uL (ref 150–400)
RBC: 5.3 MIL/uL — ABNORMAL HIGH (ref 3.87–5.11)
RDW: 12.4 % (ref 11.5–15.5)
WBC: 5.5 10*3/uL (ref 4.0–10.5)
nRBC: 0 % (ref 0.0–0.2)

## 2023-08-12 NOTE — Telephone Encounter (Signed)
Lvm to r/s 08/23/23 awv-Toni

## 2023-08-12 NOTE — Telephone Encounter (Signed)
As per husband pt like to wait for med until next appt due to pt is having stomach ache advised to watch diet and discuss at further treatment

## 2023-08-22 ENCOUNTER — Ambulatory Visit: Payer: Medicare HMO | Admitting: Internal Medicine

## 2023-08-22 ENCOUNTER — Ambulatory Visit: Payer: Medicare HMO | Admitting: Physician Assistant

## 2023-08-23 ENCOUNTER — Ambulatory Visit: Payer: Medicare HMO | Admitting: Internal Medicine

## 2023-09-02 ENCOUNTER — Telehealth: Payer: Self-pay | Admitting: Physician Assistant

## 2023-09-02 NOTE — Telephone Encounter (Signed)
 Per request, 04/07/23 office notes and labs faxed to [facility]; [phone]

## 2023-09-09 ENCOUNTER — Encounter: Payer: Self-pay | Admitting: Nurse Practitioner

## 2023-09-16 ENCOUNTER — Ambulatory Visit (INDEPENDENT_AMBULATORY_CARE_PROVIDER_SITE_OTHER): Payer: Medicare HMO | Admitting: Physician Assistant

## 2023-09-16 ENCOUNTER — Telehealth: Payer: Self-pay | Admitting: Physician Assistant

## 2023-09-16 ENCOUNTER — Encounter: Payer: Self-pay | Admitting: Physician Assistant

## 2023-09-16 VITALS — BP 130/80 | HR 80 | Temp 97.6°F | Resp 16 | Ht 65.0 in | Wt 145.0 lb

## 2023-09-16 DIAGNOSIS — R7989 Other specified abnormal findings of blood chemistry: Secondary | ICD-10-CM | POA: Diagnosis not present

## 2023-09-16 DIAGNOSIS — Z1211 Encounter for screening for malignant neoplasm of colon: Secondary | ICD-10-CM

## 2023-09-16 DIAGNOSIS — Z1212 Encounter for screening for malignant neoplasm of rectum: Secondary | ICD-10-CM

## 2023-09-16 DIAGNOSIS — Z1231 Encounter for screening mammogram for malignant neoplasm of breast: Secondary | ICD-10-CM | POA: Diagnosis not present

## 2023-09-16 DIAGNOSIS — E782 Mixed hyperlipidemia: Secondary | ICD-10-CM

## 2023-09-16 DIAGNOSIS — Z0000 Encounter for general adult medical examination without abnormal findings: Secondary | ICD-10-CM

## 2023-09-16 DIAGNOSIS — E119 Type 2 diabetes mellitus without complications: Secondary | ICD-10-CM | POA: Diagnosis not present

## 2023-09-16 LAB — POCT GLYCOSYLATED HEMOGLOBIN (HGB A1C): Hemoglobin A1C: 6 % — AB (ref 4.0–5.6)

## 2023-09-16 MED ORDER — LEVOTHYROXINE SODIUM 50 MCG PO TABS: 50.0000 ug | ORAL_TABLET | Freq: Every day | ORAL | 3 refills | Status: DC

## 2023-09-16 MED ORDER — EZETIMIBE 10 MG PO TABS: 10.0000 mg | ORAL_TABLET | Freq: Every day | ORAL | 3 refills | Status: DC

## 2023-09-16 NOTE — Telephone Encounter (Signed)
Lvm notifying patient's husband of mammogram appointment date, arrival time, location-Toni

## 2023-09-16 NOTE — Progress Notes (Signed)
[facility] [address] [place], [place] [address]  Internal MEDICINE  Office Visit Note  Patient Name: Tammy Lam  [id number]  [id number]  Date of Service: 09/23/2023  Chief Complaint  Patient presents with   Medicare Wellness   Diabetes   Hyperlipidemia   Quality Metric Gaps    Colonoscopy    HPI Tammy Lam presents for an annual well visit, her husband is with her and assists with translation Well-appearing 68 y.o.female Routine CRC screening: cologuard due Routine mammogram: due, ordered DEXA scan: Done in 2022 Eye exam and/or foot exam: eye exam will be in Summer (does this in [place] with a family friend) Labs: cholesterol still high on 20mg  crestor, added zetia, vit D still low--continue drisdol, TSH high--inc synthroid and recheck     09/16/2023    9:58 AM 08/19/2022    9:53 AM 08/10/2021   11:01 AM  MMSE - Mini Mental State Exam  Not completed: Unable to complete Unable to complete Unable to complete    Functional Status Survey: Is the patient deaf or have difficulty hearing?: No Does the patient have difficulty seeing, even when wearing glasses/contacts?: No Does the patient have difficulty concentrating, remembering, or making decisions?: No Does the patient have difficulty walking or climbing stairs?: No Does the patient have difficulty dressing or bathing?: No Does the patient have difficulty doing errands alone such as visiting a doctor's office or shopping?: Yes     08/19/2022    9:52 AM 11/22/2022   10:27 AM 01/24/2023    9:01 AM 04/07/2023    9:14 AM 09/16/2023    9:57 AM  Fall Risk  Falls in the past year? 1 0 0 0 0  Was there an injury with Fall? 0      Fall Risk Category Calculator 1      Fall Risk Category (Retired) Low           09/16/2023    9:57 AM  Depression screen PHQ 2/9  Decreased Interest 0  Down, Depressed, Hopeless 0  PHQ - 2 Score 0        No data to display            Current Medication: Outpatient  Encounter Medications as of 09/16/2023  Medication Sig   benzonatate (TESSALON) 100 MG capsule Take 1 capsule (100 mg total) by mouth 2 (two) times daily as needed for cough.   Blood Glucose Monitoring Suppl DEVI 1 each by Does not apply route in the morning, at noon, and at bedtime. May substitute to any manufacturer covered by patient's insurance.   ezetimibe (ZETIA) 10 MG tablet Take 1 tablet (10 mg total) by mouth daily.   levothyroxine (SYNTHROID) 50 MCG tablet Take 1 tablet (50 mcg total) by mouth daily.   meloxicam (MOBIC) 15 MG tablet    metFORMIN (GLUCOPHAGE) 500 MG tablet Take 1 tablet by mouth once daily with breakfast   rosuvastatin (CRESTOR) 20 MG tablet Take 1 tablet by mouth once daily   Vitamin D, Ergocalciferol, (DRISDOL) 1.25 MG (50000 UNIT) CAPS capsule Take 1 capsule by mouth once a week   [DISCONTINUED] levothyroxine (SYNTHROID) 25 MCG tablet Take 1 tablet (25 mcg total) by mouth daily.   No facility-administered encounter medications on file as of 09/16/2023.    Surgical History: Past Surgical History:  Procedure Laterality Date   ANKLE SURGERY Right     Medical History: Past Medical History:  Diagnosis Date   Diabetes mellitus without complication (HCC)  Elevated ferritin    Headache    Hemochromatosis    Hyperlipidemia    Hyponatremia    Medical history non-contributory    Shoulder injury     Family History: Family History  Problem Relation Age of Onset   Liver disease Other    Stroke Mother    Breast cancer Neg Hx     Social History   Socioeconomic History   Marital status: Married    Spouse name: Not on file   Number of children: Not on file   Years of education: Not on file   Highest education level: Not on file  Occupational History   Not on file  Tobacco Use   Smoking status: Never   Smokeless tobacco: Never  Vaping Use   Vaping status: Never Used  Substance and Sexual Activity   Alcohol use: No    Alcohol/week: 0.0 standard  drinks of alcohol   Drug use: No   Sexual activity: Yes    Partners: Male  Other Topics Concern   Not on file  Social History Narrative   Not on file   Social Drivers of Health   Financial Resource Strain: Not on file  Food Insecurity: Not on file  Transportation Needs: Not on file  Physical Activity: Not on file  Stress: Not on file  Social Connections: Not on file  Intimate Partner Violence: Not on file      Review of Systems  Constitutional:  Negative for chills, fatigue and unexpected weight change.  HENT:  Negative for congestion, postnasal drip, rhinorrhea, sneezing and sore throat.   Eyes:  Negative for redness.  Respiratory:  Negative for cough, chest tightness and shortness of breath.   Cardiovascular:  Negative for chest pain and palpitations.  Gastrointestinal:  Negative for abdominal pain, constipation, diarrhea, nausea and vomiting.  Genitourinary:  Negative for dysuria and frequency.  Musculoskeletal:  Positive for arthralgias. Negative for back pain, joint swelling and neck pain.  Skin:  Negative for rash.  Neurological: Negative.  Negative for tremors and numbness.  Hematological:  Negative for adenopathy. Does not bruise/bleed easily.  Psychiatric/Behavioral:  Negative for behavioral problems (Depression), sleep disturbance and suicidal ideas. The patient is not nervous/anxious.     Vital Signs: BP 130/80   Pulse 80   Temp 97.6 F (36.4 C)   Resp 16   Ht 5\' 5"  (1.651 m)   Wt 145 lb (65.8 kg)   LMP  (LMP Unknown)   SpO2 97%   BMI 24.13 kg/m    Physical Exam Vitals and nursing note reviewed.  Constitutional:      Appearance: Normal appearance.  HENT:     Head: Normocephalic and atraumatic.  Eyes:     Extraocular Movements: Extraocular movements intact.  Cardiovascular:     Rate and Rhythm: Normal rate and regular rhythm.  Pulmonary:     Effort: Pulmonary effort is normal.     Breath sounds: Normal breath sounds.  Neurological:      General: No focal deficit present.     Mental Status: She is alert.  Psychiatric:        Mood and Affect: Mood normal.        Behavior: Behavior normal.        Assessment/Plan: 1. Encounter for Medicare annual wellness exam (Primary) AWV performed, labs reviewed, due for mammogram and colon screening  2. Type 2 diabetes mellitus without complication, without long-term current use of insulin (HCC) - POCT HgB A1C is 6.0 which is  improved from 6.3 last visit, continue current medication and working on diet and exercise  3. Mixed hyperlipidemia Continue crestor and add zetia, work on diet and exercise - ezetimibe (ZETIA) 10 MG tablet; Take 1 tablet (10 mg total) by mouth daily.  Dispense: 90 tablet; Refill: 3  4. Elevated TSH Increase synthroid to and will recheck labs in 2 months - levothyroxine (SYNTHROID) 50 MCG tablet; Take 1 tablet (50 mcg total) by mouth daily.  Dispense: 90 tablet; Refill: 3 - TSH + free T4  5. Screening for colorectal cancer - Cologuard  6. Visit for screening mammogram - MM 3D SCREENING MAMMOGRAM BILATERAL BREAST; Future     General Counseling: Tammy Lam verbalizes understanding of the findings of todays visit and agrees with plan of treatment. I have discussed any further diagnostic evaluation that may be needed or ordered today. We also reviewed her medications today. she has been encouraged to call the office with any questions or concerns that should arise related to todays visit.    Orders Placed This Encounter  Procedures   MM 3D SCREENING MAMMOGRAM BILATERAL BREAST   Cologuard   TSH + free T4   POCT HgB A1C    Meds ordered this encounter  Medications   ezetimibe (ZETIA) 10 MG tablet    Sig: Take 1 tablet (10 mg total) by mouth daily.    Dispense:  90 tablet    Refill:  3   levothyroxine (SYNTHROID) 50 MCG tablet    Sig: Take 1 tablet (50 mcg total) by mouth daily.    Dispense:  90 tablet    Refill:  3    Please see dose change     Return in about 2 months (around 11/14/2023) for lab review.   Total time spent:35 Minutes Time spent includes review of chart, medications, test results, and follow up plan with the patient.   [place] Controlled Substance Database was reviewed by me.  This patient was seen by Lynn Ito, PA-C in collaboration with Dr. Beverely Risen as a part of collaborative care agreement.  Lynn Ito, PA-C Internal medicine

## 2023-09-20 DIAGNOSIS — R0981 Nasal congestion: Secondary | ICD-10-CM | POA: Diagnosis not present

## 2023-09-20 DIAGNOSIS — Z20828 Contact with and (suspected) exposure to other viral communicable diseases: Secondary | ICD-10-CM | POA: Diagnosis not present

## 2023-09-20 DIAGNOSIS — R059 Cough, unspecified: Secondary | ICD-10-CM | POA: Diagnosis not present

## 2023-09-20 DIAGNOSIS — R509 Fever, unspecified: Secondary | ICD-10-CM | POA: Diagnosis not present

## 2023-09-24 DIAGNOSIS — R509 Fever, unspecified: Secondary | ICD-10-CM | POA: Diagnosis not present

## 2023-09-24 DIAGNOSIS — J101 Influenza due to other identified influenza virus with other respiratory manifestations: Secondary | ICD-10-CM | POA: Diagnosis not present

## 2023-09-24 DIAGNOSIS — R059 Cough, unspecified: Secondary | ICD-10-CM | POA: Diagnosis not present

## 2023-09-26 DIAGNOSIS — R059 Cough, unspecified: Secondary | ICD-10-CM | POA: Diagnosis not present

## 2023-09-26 DIAGNOSIS — J209 Acute bronchitis, unspecified: Secondary | ICD-10-CM | POA: Diagnosis not present

## 2023-09-28 ENCOUNTER — Other Ambulatory Visit: Payer: Self-pay | Admitting: Physician Assistant

## 2023-09-28 DIAGNOSIS — E559 Vitamin D deficiency, unspecified: Secondary | ICD-10-CM

## 2023-10-25 DIAGNOSIS — Z1212 Encounter for screening for malignant neoplasm of rectum: Secondary | ICD-10-CM | POA: Diagnosis not present

## 2023-10-25 DIAGNOSIS — Z1211 Encounter for screening for malignant neoplasm of colon: Secondary | ICD-10-CM | POA: Diagnosis not present

## 2023-10-29 ENCOUNTER — Other Ambulatory Visit: Payer: Self-pay | Admitting: Physician Assistant

## 2023-10-29 DIAGNOSIS — E782 Mixed hyperlipidemia: Secondary | ICD-10-CM

## 2023-10-31 DIAGNOSIS — R7989 Other specified abnormal findings of blood chemistry: Secondary | ICD-10-CM | POA: Diagnosis not present

## 2023-11-01 LAB — TSH+FREE T4
Free T4: 1.25 ng/dL (ref 0.82–1.77)
TSH: 3.1 u[IU]/mL (ref 0.450–4.500)

## 2023-11-01 LAB — COLOGUARD: COLOGUARD: POSITIVE — AB

## 2023-11-03 ENCOUNTER — Encounter: Payer: Self-pay | Admitting: Nurse Practitioner

## 2023-11-04 ENCOUNTER — Telehealth: Payer: Self-pay

## 2023-11-04 ENCOUNTER — Other Ambulatory Visit: Payer: Self-pay | Admitting: Physician Assistant

## 2023-11-04 DIAGNOSIS — R195 Other fecal abnormalities: Secondary | ICD-10-CM

## 2023-11-04 NOTE — Telephone Encounter (Signed)
 LVM and sent MyChart message for patient regarding labs and positive cologuard.

## 2023-11-10 ENCOUNTER — Ambulatory Visit
Admission: RE | Admit: 2023-11-10 | Discharge: 2023-11-10 | Disposition: A | Discharge status: Home / Self Care | Payer: Medicare HMO | Source: Ambulatory Visit | Attending: Physician Assistant | Admitting: Physician Assistant

## 2023-11-10 DIAGNOSIS — Z1231 Encounter for screening mammogram for malignant neoplasm of breast: Secondary | ICD-10-CM | POA: Diagnosis not present

## 2023-11-11 ENCOUNTER — Telehealth: Payer: Self-pay

## 2023-11-11 ENCOUNTER — Encounter: Payer: Self-pay | Admitting: Nurse Practitioner

## 2023-11-11 ENCOUNTER — Other Ambulatory Visit: Payer: Self-pay

## 2023-11-11 ENCOUNTER — Telehealth: Payer: Self-pay | Admitting: Gastroenterology

## 2023-11-11 DIAGNOSIS — R195 Other fecal abnormalities: Secondary | ICD-10-CM

## 2023-11-11 DIAGNOSIS — Z1211 Encounter for screening for malignant neoplasm of colon: Secondary | ICD-10-CM

## 2023-11-11 MED ORDER — NA SULFATE-K SULFATE-MG SULF 17.5-3.13-1.6 GM/177ML PO SOLN: 1.0000 | Freq: Once | ORAL | 0 refills | Status: AC

## 2023-11-11 NOTE — Telephone Encounter (Signed)
 Gastroenterology Pre-Procedure Review  Request Date: 12/07/23 Requesting Physician: Dr. Tobi Bastos  PATIENT REVIEW QUESTIONS: The patient gave verbal permission for her husband tan long to answer the following health history questions as indicated:    1. Are you having any GI issues? Positive cologuard, screening colonoscopy 2. Do you have a personal history of Polyps? no 3. Do you have a family history of Colon Cancer or Polyps? no 4. Diabetes Mellitus? no 5. Joint replacements in the past 12 months?no 6. Major health problems in the past 3 months?no 7. Any artificial heart valves, MVP, or defibrillator?no    MEDICATIONS & ALLERGIES:    Patient reports the following regarding taking any anticoagulation/antiplatelet therapy:   Plavix, Coumadin, Eliquis, Xarelto, Lovenox, Pradaxa, Brilinta, or Effient? no Aspirin? no  Patient confirms/reports the following medications:  Current Outpatient Medications  Medication Sig Dispense Refill   Na Sulfate-K Sulfate-Mg Sulfate concentrate (SUPREP) 17.5-3.13-1.6 GM/177ML SOLN Take 1 kit (354 mLs total) by mouth once for 1 dose. 354 mL 0   benzonatate (TESSALON) 100 MG capsule Take 1 capsule (100 mg total) by mouth 2 (two) times daily as needed for cough. 20 capsule 0   Blood Glucose Monitoring Suppl DEVI 1 each by Does not apply route in the morning, at noon, and at bedtime. May substitute to any manufacturer covered by patient's insurance. 1 each 0   ezetimibe (ZETIA) 10 MG tablet Take 1 tablet (10 mg total) by mouth daily. 90 tablet 3   levothyroxine (SYNTHROID) 50 MCG tablet Take 1 tablet (50 mcg total) by mouth daily. 90 tablet 3   meloxicam (MOBIC) 15 MG tablet      metFORMIN (GLUCOPHAGE) 500 MG tablet Take 1 tablet by mouth once daily with breakfast 90 tablet 0   rosuvastatin (CRESTOR) 20 MG tablet Take 1 tablet by mouth once daily 90 tablet 0   Vitamin D, Ergocalciferol, (DRISDOL) 1.25 MG (50000 UNIT) CAPS capsule Take 1 capsule by mouth once a  week 12 capsule 0   No current facility-administered medications for this visit.    Patient confirms/reports the following allergies:  Allergies  Allergen Reactions   Diphenhydramine Other (See Comments)   Benadryl [Diphenhydramine Hcl] Itching   Influenza Vaccines    Penicillins Itching   Tricor [Fenofibrate]     No orders of the defined types were placed in this encounter.   AUTHORIZATION INFORMATION Primary Insurance: 1D#: Group #:  Secondary Insurance: 1D#: Group #:  SCHEDULE INFORMATION: Date: 12/07/23 Time: Location: [facility]

## 2023-11-11 NOTE — Telephone Encounter (Signed)
 Call returned.  LVM for pt's husband to call me back.  Thanks,  [place], [place]

## 2023-11-11 NOTE — Telephone Encounter (Signed)
 The patient husband called back.

## 2023-11-11 NOTE — Telephone Encounter (Signed)
 The patient husband (Long) called in to schedule her colonoscopy.

## 2023-11-11 NOTE — Progress Notes (Signed)
 Gastroenterology Pre-Procedure Review  Request Date: 12/07/23 Requesting Physician: Dr. Tobi Bastos  PATIENT REVIEW QUESTIONS: The patient responded to the following health history questions as indicated:    1. Are you having any GI issues? yes (Positive cologuard) 2. Do you have a personal history of Polyps? no 3. Do you have a family history of Colon Cancer or Polyps? no 4. Diabetes Mellitus? yes (takes metformin has been advised to stop 2 days prior to colonoscopy) 5. Joint replacements in the past 12 months?no 6. Major health problems in the past 3 months?no 7. Any artificial heart valves, MVP, or defibrillator?no    MEDICATIONS & ALLERGIES:    Patient reports the following regarding taking any anticoagulation/antiplatelet therapy:   Plavix, Coumadin, Eliquis, Xarelto, Lovenox, Pradaxa, Brilinta, or Effient? no Aspirin? no  Patient confirms/reports the following medications:  Current Outpatient Medications  Medication Sig Dispense Refill   benzonatate (TESSALON) 100 MG capsule Take 1 capsule (100 mg total) by mouth 2 (two) times daily as needed for cough. 20 capsule 0   Blood Glucose Monitoring Suppl DEVI 1 each by Does not apply route in the morning, at noon, and at bedtime. May substitute to any manufacturer covered by patient's insurance. 1 each 0   ezetimibe (ZETIA) 10 MG tablet Take 1 tablet (10 mg total) by mouth daily. 90 tablet 3   levothyroxine (SYNTHROID) 50 MCG tablet Take 1 tablet (50 mcg total) by mouth daily. 90 tablet 3   meloxicam (MOBIC) 15 MG tablet      metFORMIN (GLUCOPHAGE) 500 MG tablet Take 1 tablet by mouth once daily with breakfast 90 tablet 0   rosuvastatin (CRESTOR) 20 MG tablet Take 1 tablet by mouth once daily 90 tablet 0   Vitamin D, Ergocalciferol, (DRISDOL) 1.25 MG (50000 UNIT) CAPS capsule Take 1 capsule by mouth once a week 12 capsule 0   No current facility-administered medications for this visit.    Patient confirms/reports the following allergies:   Allergies  Allergen Reactions   Diphenhydramine Other (See Comments)   Benadryl [Diphenhydramine Hcl] Itching   Influenza Vaccines    Penicillins Itching   Tricor [Fenofibrate]     No orders of the defined types were placed in this encounter.   AUTHORIZATION INFORMATION Primary Insurance: 1D#: Group #:  Secondary Insurance: 1D#: Group #:  SCHEDULE INFORMATION: Date: 12/07/23 Time: Location: [facility]

## 2023-11-14 ENCOUNTER — Encounter: Payer: Self-pay | Admitting: Physician Assistant

## 2023-11-14 ENCOUNTER — Ambulatory Visit (INDEPENDENT_AMBULATORY_CARE_PROVIDER_SITE_OTHER): Payer: Medicare HMO | Admitting: Physician Assistant

## 2023-11-14 VITALS — BP 112/80 | HR 64 | Temp 98.1°F | Resp 16 | Ht 65.0 in | Wt 143.0 lb

## 2023-11-14 DIAGNOSIS — E119 Type 2 diabetes mellitus without complications: Secondary | ICD-10-CM

## 2023-11-14 DIAGNOSIS — R7989 Other specified abnormal findings of blood chemistry: Secondary | ICD-10-CM

## 2023-11-14 DIAGNOSIS — E782 Mixed hyperlipidemia: Secondary | ICD-10-CM | POA: Diagnosis not present

## 2023-11-14 NOTE — Progress Notes (Signed)
 [facility] [address] [place], [place] [address]  Internal MEDICINE  Office Visit Note  Patient Name: Tammy Lam  [id number]  [id number]  Date of Service: 11/23/2023  Chief Complaint  Patient presents with   Follow-up    Review labs   Diabetes   Hyperlipidemia    HPI Pt is here for routine follow up to review labs -Thyroid labs back in range on current dose and will continue -Cologuard did come back positive therefore pt scheduled for colonoscopy next month -Tolerating crestor + zetia -Not checking BG at home, scared to prick finger, may try a few days per week. Has improved greatly -Had the flu back in Jan, has recovered now  Current Medication: Outpatient Encounter Medications as of 11/14/2023  Medication Sig   benzonatate (TESSALON) 100 MG capsule Take 1 capsule (100 mg total) by mouth 2 (two) times daily as needed for cough.   Blood Glucose Monitoring Suppl DEVI 1 each by Does not apply route in the morning, at noon, and at bedtime. May substitute to any manufacturer covered by patient's insurance.   ezetimibe (ZETIA) 10 MG tablet Take 1 tablet (10 mg total) by mouth daily.   levothyroxine (SYNTHROID) 50 MCG tablet Take 1 tablet (50 mcg total) by mouth daily.   meloxicam (MOBIC) 15 MG tablet    metFORMIN (GLUCOPHAGE) 500 MG tablet Take 1 tablet by mouth once daily with breakfast   rosuvastatin (CRESTOR) 20 MG tablet Take 1 tablet by mouth once daily   Vitamin D, Ergocalciferol, (DRISDOL) 1.25 MG (50000 UNIT) CAPS capsule Take 1 capsule by mouth once a week   No facility-administered encounter medications on file as of 11/14/2023.    Surgical History: Past Surgical History:  Procedure Laterality Date   ANKLE SURGERY Right     Medical History: Past Medical History:  Diagnosis Date   Diabetes mellitus without complication (HCC)    Elevated ferritin    Headache    Hemochromatosis    Hyperlipidemia    Hyponatremia    Medical history non-contributory     Shoulder injury     Family History: Family History  Problem Relation Age of Onset   Liver disease Other    Stroke Mother    Breast cancer Neg Hx     Social History   Socioeconomic History   Marital status: Married    Spouse name: Not on file   Number of children: Not on file   Years of education: Not on file   Highest education level: Not on file  Occupational History   Not on file  Tobacco Use   Smoking status: Never   Smokeless tobacco: Never  Vaping Use   Vaping status: Never Used  Substance and Sexual Activity   Alcohol use: No    Alcohol/week: 0.0 standard drinks of alcohol   Drug use: No   Sexual activity: Yes    Partners: Male  Other Topics Concern   Not on file  Social History Narrative   Not on file   Social Drivers of Health   Financial Resource Strain: Not on file  Food Insecurity: Not on file  Transportation Needs: Not on file  Physical Activity: Not on file  Stress: Not on file  Social Connections: Not on file  Intimate Partner Violence: Not on file      Review of Systems  Constitutional:  Negative for chills, fatigue and unexpected weight change.  HENT:  Negative for congestion, postnasal drip, rhinorrhea, sneezing and sore throat.  Eyes:  Negative for redness.  Respiratory:  Negative for cough, chest tightness and shortness of breath.   Cardiovascular:  Negative for chest pain and palpitations.  Gastrointestinal:  Negative for abdominal pain, constipation, diarrhea, nausea and vomiting.  Genitourinary:  Negative for dysuria and frequency.  Musculoskeletal:  Positive for arthralgias. Negative for back pain, joint swelling and neck pain.  Skin:  Negative for rash.  Neurological: Negative.  Negative for tremors and numbness.  Hematological:  Negative for adenopathy. Does not bruise/bleed easily.  Psychiatric/Behavioral:  Negative for behavioral problems (Depression), sleep disturbance and suicidal ideas. The patient is not nervous/anxious.      Vital Signs: BP 112/80   Pulse 64   Temp 98.1 F (36.7 C)   Resp 16   Ht 5\' 5"  (1.651 m)   Wt 143 lb (64.9 kg)   LMP  (LMP Unknown)   SpO2 97%   BMI 23.80 kg/m    Physical Exam Vitals and nursing note reviewed.  Constitutional:      Appearance: Normal appearance.  HENT:     Head: Normocephalic and atraumatic.  Eyes:     Extraocular Movements: Extraocular movements intact.  Cardiovascular:     Rate and Rhythm: Normal rate and regular rhythm.  Pulmonary:     Effort: Pulmonary effort is normal.     Breath sounds: Normal breath sounds.  Neurological:     General: No focal deficit present.     Mental Status: She is alert.  Psychiatric:        Mood and Affect: Mood normal.        Behavior: Behavior normal.        Assessment/Plan: 1. Type 2 diabetes mellitus without complication, without long-term current use of insulin (HCC) (Primary) Continue current medication and working on diet and exercise.   2. Mixed hyperlipidemia Tolerating crestor +zetia, will recheck labs in a few months - Lipid Panel With LDL/HDL Ratio  3. Elevated TSH Improved, continue synthroid   General Counseling: Tammy Lam verbalizes understanding of the findings of todays visit and agrees with plan of treatment. I have discussed any further diagnostic evaluation that may be needed or ordered today. We also reviewed her medications today. she has been encouraged to call the office with any questions or concerns that should arise related to todays visit.    Orders Placed This Encounter  Procedures   Lipid Panel With LDL/HDL Ratio    No orders of the defined types were placed in this encounter.   This patient was seen by Lynn Ito, PA-C in collaboration with Dr. Beverely Risen as a part of collaborative care agreement.   Total time spent:30 Minutes Time spent includes review of chart, medications, test results, and follow up plan with the patient.      Dr Lyndon Code Internal  medicine

## 2023-12-07 ENCOUNTER — Ambulatory Visit: Admitting: Anesthesiology

## 2023-12-07 ENCOUNTER — Ambulatory Visit
Admission: RE | Admit: 2023-12-07 | Discharge: 2023-12-07 | Disposition: A | Discharge status: Home / Self Care | Attending: Gastroenterology | Admitting: Gastroenterology

## 2023-12-07 ENCOUNTER — Encounter
Admission: RE | Disposition: A | Discharge status: Home / Self Care | Payer: Self-pay | Source: Home / Self Care | Attending: Gastroenterology

## 2023-12-07 DIAGNOSIS — K640 First degree hemorrhoids: Secondary | ICD-10-CM | POA: Insufficient documentation

## 2023-12-07 DIAGNOSIS — R195 Other fecal abnormalities: Secondary | ICD-10-CM | POA: Diagnosis not present

## 2023-12-07 DIAGNOSIS — Z1211 Encounter for screening for malignant neoplasm of colon: Secondary | ICD-10-CM | POA: Diagnosis not present

## 2023-12-07 DIAGNOSIS — D126 Benign neoplasm of colon, unspecified: Secondary | ICD-10-CM

## 2023-12-07 DIAGNOSIS — D122 Benign neoplasm of ascending colon: Secondary | ICD-10-CM | POA: Diagnosis not present

## 2023-12-07 DIAGNOSIS — K635 Polyp of colon: Secondary | ICD-10-CM | POA: Diagnosis not present

## 2023-12-07 DIAGNOSIS — E119 Type 2 diabetes mellitus without complications: Secondary | ICD-10-CM | POA: Insufficient documentation

## 2023-12-07 LAB — GLUCOSE, CAPILLARY: Glucose-Capillary: 112 mg/dL — ABNORMAL HIGH (ref 70–99)

## 2023-12-07 SURGERY — COLONOSCOPY
Anesthesia: General

## 2023-12-07 MED ORDER — EPHEDRINE SULFATE-NACL 50-0.9 MG/10ML-% IV SOSY
PREFILLED_SYRINGE | INTRAVENOUS | Status: DC | PRN
  Administered 2023-12-07: 10 mg via INTRAVENOUS

## 2023-12-07 MED ORDER — LIDOCAINE HCL (CARDIAC) PF 100 MG/5ML IV SOSY
PREFILLED_SYRINGE | INTRAVENOUS | Status: DC | PRN
  Administered 2023-12-07: 60 mg via INTRAVENOUS

## 2023-12-07 MED ORDER — DEXMEDETOMIDINE HCL IN NACL 80 MCG/20ML IV SOLN
INTRAVENOUS | Status: DC | PRN
  Administered 2023-12-07: 12 ug via INTRAVENOUS

## 2023-12-07 MED ORDER — LIDOCAINE HCL (PF) 2 % IJ SOLN
INTRAMUSCULAR | Status: AC
  Filled 2023-12-07: qty 5

## 2023-12-07 MED ORDER — PROPOFOL 1000 MG/100ML IV EMUL
INTRAVENOUS | Status: AC
  Filled 2023-12-07: qty 100

## 2023-12-07 MED ORDER — PROPOFOL 500 MG/50ML IV EMUL
INTRAVENOUS | Status: DC | PRN
  Administered 2023-12-07: 75 ug/kg/min via INTRAVENOUS

## 2023-12-07 MED ORDER — DEXMEDETOMIDINE HCL IN NACL 80 MCG/20ML IV SOLN
INTRAVENOUS | Status: AC
  Filled 2023-12-07: qty 20

## 2023-12-07 MED ORDER — SODIUM CHLORIDE 0.9 % IV SOLN
INTRAVENOUS | Status: DC
  Administered 2023-12-07: 20 mL/h via INTRAVENOUS

## 2023-12-07 MED ORDER — PROPOFOL 10 MG/ML IV BOLUS
INTRAVENOUS | Status: DC | PRN
  Administered 2023-12-07: 50 mg via INTRAVENOUS

## 2023-12-07 NOTE — Transfer of Care (Signed)
 Immediate Anesthesia Transfer of Care Note  Patient: Tammy Lam  Procedure(s) Performed: COLONOSCOPY  Patient Location: PACU  Anesthesia Type:General  Level of Consciousness: sedated  Airway & Oxygen Therapy: Patient Spontanous Breathing  Post-op Assessment: Report given to RN and Post -op Vital signs reviewed and stable  Post vital signs: Reviewed and stable  Last Vitals:  Vitals Value Taken Time  BP 93/53 12/07/23 1036  Temp 36.1 C 12/07/23 1036  Pulse 58 12/07/23 1036  Resp 19 12/07/23 1036  SpO2 98 % 12/07/23 1036    Last Pain:  Vitals:   12/07/23 1036  TempSrc: Temporal  PainSc: 0-No pain         Complications: No notable events documented.

## 2023-12-07 NOTE — Anesthesia Postprocedure Evaluation (Signed)
 Anesthesia Post Note  Patient: Tammy Lam  Procedure(s) Performed: COLONOSCOPY  Patient location during evaluation: Endoscopy Anesthesia Type: General Level of consciousness: awake and alert Pain management: pain level controlled Vital Signs Assessment: post-procedure vital signs reviewed and stable Respiratory status: spontaneous breathing, nonlabored ventilation and respiratory function stable Cardiovascular status: blood pressure returned to baseline and stable Postop Assessment: no apparent nausea or vomiting Anesthetic complications: no   No notable events documented.   Last Vitals:  Vitals:   12/07/23 1046 12/07/23 1053  BP: (!) 115/56 (!) 111/55  Pulse: 80 72  Resp: 20 18  Temp:    SpO2: 98% 99%    Last Pain:  Vitals:   12/07/23 1053  TempSrc:   PainSc: 0-No pain                 Baltazar Bonier

## 2023-12-07 NOTE — H&P (Signed)
 Wyline Mood, MD [address], [place], [place], [address] [address], [place], [place], [address] Phone: [phone]  Fax: [phone]  Primary Care Physician:  Lyndon Code, MD   Pre-Procedure History & Physical: HPI:  Tammy Lam is a 68 y.o. female is here for an colonoscopy.   Past Medical History:  Diagnosis Date   Diabetes mellitus without complication (HCC)    Elevated ferritin    Headache    Hemochromatosis    Hyperlipidemia    Hyponatremia    Medical history non-contributory    Shoulder injury     Past Surgical History:  Procedure Laterality Date   ANKLE SURGERY Right     Prior to Admission medications   Medication Sig Start Date End Date Taking? Authorizing Provider  benzonatate (TESSALON) 100 MG capsule Take 1 capsule (100 mg total) by mouth 2 (two) times daily as needed for cough. 02/02/22   Sallyanne Kuster, NP  Blood Glucose Monitoring Suppl DEVI 1 each by Does not apply route in the morning, at noon, and at bedtime. May substitute to any manufacturer covered by patient's insurance. 11/22/22   McDonough, Salomon Fick, PA-C  ezetimibe (ZETIA) 10 MG tablet Take 1 tablet (10 mg total) by mouth daily. 09/16/23   McDonough, Salomon Fick, PA-C  levothyroxine (SYNTHROID) 50 MCG tablet Take 1 tablet (50 mcg total) by mouth daily. 09/16/23   McDonough, Salomon Fick, PA-C  meloxicam (MOBIC) 15 MG tablet  08/27/16   [provider]  metFORMIN (GLUCOPHAGE) 500 MG tablet Take 1 tablet by mouth once daily with breakfast 07/06/23   McDonough, Salomon Fick, PA-C  rosuvastatin (CRESTOR) 20 MG tablet Take 1 tablet by mouth once daily 10/31/23   McDonough, Salomon Fick, PA-C  Vitamin D, Ergocalciferol, (DRISDOL) 1.25 MG (50000 UNIT) CAPS capsule Take 1 capsule by mouth once a week 09/28/23   Carlean Jews, PA-C    Allergies as of 11/11/2023 - Review Complete 09/16/2023  Allergen Reaction Noted   Diphenhydramine Other (See Comments) 09/15/2018   Benadryl  [diphenhydramine hcl] Itching 01/13/2015   Influenza vaccines  10/04/2017   Penicillins Itching 01/13/2015   Tricor [fenofibrate]  07/22/2020    Family History  Problem Relation Age of Onset   Liver disease Other    Stroke Mother    Breast cancer Neg Hx     Social History   Socioeconomic History   Marital status: Married    Spouse name: Not on file   Number of children: Not on file   Years of education: Not on file   Highest education level: Not on file  Occupational History   Not on file  Tobacco Use   Smoking status: Never   Smokeless tobacco: Never  Vaping Use   Vaping status: Never Used  Substance and Sexual Activity   Alcohol use: No    Alcohol/week: 0.0 standard drinks of alcohol   Drug use: No   Sexual activity: Yes    Partners: Male  Other Topics Concern   Not on file  Social History Narrative   Not on file   Social Drivers of Health   Financial Resource Strain: Not on file  Food Insecurity: Not on file  Transportation Needs: Not on file  Physical Activity: Not on file  Stress: Not on file  Social Connections: Not on file  Intimate Partner Violence: Not on file    Review of Systems: See HPI, otherwise negative ROS  Physical Exam: LMP  (LMP  Unknown)  General:   Alert,  pleasant and cooperative in NAD Head:  Normocephalic and atraumatic. Neck:  Supple; no masses or thyromegaly. Lungs:  Clear throughout to auscultation, normal respiratory effort.    Heart:  +S1, +S2, Regular rate and rhythm, No edema. Abdomen:  Soft, nontender and nondistended. Normal bowel sounds, without guarding, and without rebound.   Neurologic:  Alert and  oriented x4;  grossly normal neurologically.  Impression/Plan: Tammy Lam is here for an colonoscopy to be performed for positive cologuard. Risks, benefits, limitations, and alternatives regarding  colonoscopy have been reviewed with the patient.  Questions have been answered.  All parties agreeable.   Luke Salaam, MD   12/07/2023, 9:43 AM

## 2023-12-07 NOTE — Op Note (Signed)
 [facility] Gastroenterology Patient Name: Tammy Lam Procedure Date: 12/07/2023 10:06 AM MRN: [id number] Account #: [id number] Date of Birth: 01/13/1956 Admit Type: Outpatient Age: 68 Room: [facility] ENDO ROOM 3 Gender: Female Note Status: Finalized Instrument Name: Peds Colonoscope [id number] Procedure:             Colonoscopy Indications:           Screening for colorectal malignant neoplasm due to                         positive Cologuard test Providers:             Wyline Mood MD, MD Medicines:             Monitored Anesthesia Care Complications:         No immediate complications. Procedure:             Pre-Anesthesia Assessment:                        - Prior to the procedure, a History and Physical was                         performed, and patient medications, allergies and                         sensitivities were reviewed. The patient's tolerance                         of previous anesthesia was reviewed.                        - The risks and benefits of the procedure and the                         sedation options and risks were discussed with the                         patient. All questions were answered and informed                         consent was obtained.                        - ASA Grade Assessment: II - A patient with mild                         systemic disease.                        After obtaining informed consent, the colonoscope was                         passed under direct vision. Throughout the procedure,                         the patient's blood pressure, pulse, and oxygen                         saturations were monitored continuously. The  Colonoscope was introduced through the anus and                         advanced to the the cecum, identified by the                         appendiceal orifice. The colonoscopy was performed                         with ease. The patient tolerated the procedure well.                          The quality of the bowel preparation was excellent.                         The ileocecal valve, appendiceal orifice, and rectum                         were photographed. Findings:      The perianal and digital rectal examinations were normal.      Two sessile polyps were found in the ascending colon. The polyps were 5       to 6 mm in size. These polyps were removed with a cold snare. Resection       and retrieval were complete.      Non-bleeding internal hemorrhoids were found during retroflexion. The       hemorrhoids were medium-sized and Grade I (internal hemorrhoids that do       not prolapse).      The exam was otherwise without abnormality on direct and retroflexion       views. Impression:            - Two 5 to 6 mm polyps in the ascending colon, removed                         with a cold snare. Resected and retrieved.                        - Non-bleeding internal hemorrhoids.                        - The examination was otherwise normal on direct and                         retroflexion views. Recommendation:        - Discharge patient to home (with escort).                        - Resume previous diet.                        - Continue present medications.                        - Await pathology results.                        - Repeat colonoscopy for surveillance based on  pathology results. Procedure Code(s):     --- Professional ---                        [phone], Colonoscopy, flexible; with removal of                         tumor(s), polyp(s), or other lesion(s) by snare                         technique Diagnosis Code(s):     --- Professional ---                        Z12.11, Encounter for screening for malignant neoplasm                         of colon                        R19.5, Other fecal abnormalities                        D12.2, Benign neoplasm of ascending colon                        K64.0, First degree  hemorrhoids CPT copyright 2022 American Medical Association. All rights reserved. The codes documented in this report are preliminary and upon coder review may  be revised to meet current compliance requirements. Luke Salaam, MD Luke Salaam MD, MD 12/07/2023 10:36:54 AM This report has been signed electronically. Number of Addenda: 0 Note Initiated On: 12/07/2023 10:06 AM Scope Withdrawal Time: 0 hours 8 minutes 30 seconds  Total Procedure Duration: 0 hours 12 minutes 7 seconds  Estimated Blood Loss:  Estimated blood loss: none.      [facility]

## 2023-12-07 NOTE — Anesthesia Preprocedure Evaluation (Addendum)
 Anesthesia Evaluation  Patient identified by MRN, date of birth, ID band Patient awake    Reviewed: Allergy & Precautions, H&P , NPO status , Patient's Chart, lab work & pertinent test results  Airway Mallampati: III  TM Distance: >3 FB Neck ROM: full    Dental no notable dental hx.    Pulmonary neg pulmonary ROS   Pulmonary exam normal        Cardiovascular negative cardio ROS Normal cardiovascular exam     Neuro/Psych negative neurological ROS  negative psych ROS   GI/Hepatic negative GI ROS, Neg liver ROS,,,  Endo/Other  diabetes (prediabetes per patient)    Renal/GU negative Renal ROS  negative genitourinary   Musculoskeletal   Abdominal   Peds  Hematology negative hematology ROS (+)   Anesthesia Other Findings Past Medical History: No date: Diabetes mellitus without complication (HCC) No date: Elevated ferritin No date: Headache No date: Hemochromatosis No date: Hyperlipidemia No date: Hyponatremia No date: Medical history non-contributory No date: Shoulder injury  Past Surgical History: No date: ANKLE SURGERY; Right     Reproductive/Obstetrics negative OB ROS                             Anesthesia Physical Anesthesia Plan  ASA: 2  Anesthesia Plan: General   Post-op Pain Management:    Induction: Intravenous  PONV Risk Score and Plan: Propofol infusion and TIVA  Airway Management Planned: Natural Airway  Additional Equipment:   Intra-op Plan:   Post-operative Plan:   Informed Consent: I have reviewed the patients History and Physical, chart, labs and discussed the procedure including the risks, benefits and alternatives for the proposed anesthesia with the patient or authorized representative who has indicated his/her understanding and acceptance.     Dental Advisory Given  Plan Discussed with: CRNA and Surgeon  Anesthesia Plan Comments:          Anesthesia Quick Evaluation

## 2023-12-08 ENCOUNTER — Encounter: Payer: Self-pay | Admitting: Gastroenterology

## 2023-12-08 LAB — SURGICAL PATHOLOGY

## 2023-12-11 ENCOUNTER — Encounter: Payer: Self-pay | Admitting: Gastroenterology

## 2023-12-20 ENCOUNTER — Other Ambulatory Visit: Payer: Self-pay | Admitting: Physician Assistant

## 2023-12-20 DIAGNOSIS — E559 Vitamin D deficiency, unspecified: Secondary | ICD-10-CM

## 2023-12-21 ENCOUNTER — Telehealth: Payer: Self-pay | Admitting: Physician Assistant

## 2023-12-21 NOTE — Telephone Encounter (Signed)
 Lmom to call us back

## 2023-12-22 NOTE — Telephone Encounter (Addendum)
 Try to call pt several time that we can add test at next visit

## 2024-01-26 ENCOUNTER — Other Ambulatory Visit: Payer: Self-pay | Admitting: Physician Assistant

## 2024-01-26 DIAGNOSIS — E782 Mixed hyperlipidemia: Secondary | ICD-10-CM

## 2024-02-13 ENCOUNTER — Ambulatory Visit: Admitting: Physician Assistant

## 2024-03-11 ENCOUNTER — Other Ambulatory Visit: Payer: Self-pay | Admitting: Physician Assistant

## 2024-03-11 DIAGNOSIS — E559 Vitamin D deficiency, unspecified: Secondary | ICD-10-CM

## 2024-04-24 DIAGNOSIS — J3489 Other specified disorders of nose and nasal sinuses: Secondary | ICD-10-CM | POA: Diagnosis not present

## 2024-04-24 DIAGNOSIS — R051 Acute cough: Secondary | ICD-10-CM | POA: Diagnosis not present

## 2024-04-24 DIAGNOSIS — U071 COVID-19: Secondary | ICD-10-CM | POA: Diagnosis not present

## 2024-04-24 DIAGNOSIS — R0981 Nasal congestion: Secondary | ICD-10-CM | POA: Diagnosis not present

## 2024-04-25 ENCOUNTER — Other Ambulatory Visit: Payer: Self-pay | Admitting: Physician Assistant

## 2024-04-25 DIAGNOSIS — E782 Mixed hyperlipidemia: Secondary | ICD-10-CM

## 2024-05-07 DIAGNOSIS — B9689 Other specified bacterial agents as the cause of diseases classified elsewhere: Secondary | ICD-10-CM | POA: Diagnosis not present

## 2024-05-07 DIAGNOSIS — R051 Acute cough: Secondary | ICD-10-CM | POA: Diagnosis not present

## 2024-05-07 DIAGNOSIS — J208 Acute bronchitis due to other specified organisms: Secondary | ICD-10-CM | POA: Diagnosis not present

## 2024-05-14 DIAGNOSIS — E113393 Type 2 diabetes mellitus with moderate nonproliferative diabetic retinopathy without macular edema, bilateral: Secondary | ICD-10-CM | POA: Diagnosis not present

## 2024-05-17 ENCOUNTER — Other Ambulatory Visit: Payer: Self-pay

## 2024-05-17 ENCOUNTER — Ambulatory Visit: Admitting: Physician Assistant

## 2024-05-17 DIAGNOSIS — R7989 Other specified abnormal findings of blood chemistry: Secondary | ICD-10-CM

## 2024-05-18 ENCOUNTER — Inpatient Hospital Stay (HOSPITAL_BASED_OUTPATIENT_CLINIC_OR_DEPARTMENT_OTHER): Payer: Medicare HMO | Admitting: Oncology

## 2024-05-18 ENCOUNTER — Inpatient Hospital Stay: Payer: Medicare HMO | Attending: Oncology

## 2024-05-18 ENCOUNTER — Encounter: Payer: Self-pay | Admitting: Oncology

## 2024-05-18 DIAGNOSIS — R7989 Other specified abnormal findings of blood chemistry: Secondary | ICD-10-CM | POA: Diagnosis not present

## 2024-05-18 DIAGNOSIS — K760 Fatty (change of) liver, not elsewhere classified: Secondary | ICD-10-CM | POA: Insufficient documentation

## 2024-05-18 LAB — CBC WITH DIFFERENTIAL (CANCER CENTER ONLY)
Abs Immature Granulocytes: 0.01 K/uL (ref 0.00–0.07)
Basophils Absolute: 0 K/uL (ref 0.0–0.1)
Basophils Relative: 1 %
Eosinophils Absolute: 0.2 K/uL (ref 0.0–0.5)
Eosinophils Relative: 4 %
HCT: 43.2 % (ref 36.0–46.0)
Hemoglobin: 14.7 g/dL (ref 12.0–15.0)
Immature Granulocytes: 0 %
Lymphocytes Relative: 44 %
Lymphs Abs: 2.3 K/uL (ref 0.7–4.0)
MCH: 31.3 pg (ref 26.0–34.0)
MCHC: 34 g/dL (ref 30.0–36.0)
MCV: 91.9 fL (ref 80.0–100.0)
Monocytes Absolute: 0.3 K/uL (ref 0.1–1.0)
Monocytes Relative: 7 %
Neutro Abs: 2.2 K/uL (ref 1.7–7.7)
Neutrophils Relative %: 44 %
Platelet Count: 145 K/uL — ABNORMAL LOW (ref 150–400)
RBC: 4.7 MIL/uL (ref 3.87–5.11)
RDW: 12.1 % (ref 11.5–15.5)
WBC Count: 5.1 K/uL (ref 4.0–10.5)
nRBC: 0 % (ref 0.0–0.2)

## 2024-05-18 LAB — CMP (CANCER CENTER ONLY)
ALT: 16 U/L (ref 0–44)
AST: 25 U/L (ref 15–41)
Albumin: 3.9 g/dL (ref 3.5–5.0)
Alkaline Phosphatase: 111 U/L (ref 38–126)
Anion gap: 9 (ref 5–15)
BUN: 13 mg/dL (ref 8–23)
CO2: 23 mmol/L (ref 22–32)
Calcium: 9 mg/dL (ref 8.9–10.3)
Chloride: 105 mmol/L (ref 98–111)
Creatinine: 0.67 mg/dL (ref 0.44–1.00)
GFR, Estimated: >60 mL/min (ref >60)
Glucose, Bld: 96 mg/dL (ref 70–99)
Potassium: 3.8 mmol/L (ref 3.5–5.1)
Sodium: 137 mmol/L (ref 135–145)
Total Bilirubin: 0.9 mg/dL (ref 0.0–1.2)
Total Protein: 6.8 g/dL (ref 6.5–8.1)

## 2024-05-18 LAB — IRON AND TIBC
Iron: 92 ug/dL (ref 28–170)
Saturation Ratios: 24 % (ref 10.4–31.8)
TIBC: 384 ug/dL (ref 250–450)
UIBC: 292 ug/dL

## 2024-05-18 LAB — FERRITIN: Ferritin: 323 ng/mL — ABNORMAL HIGH (ref 11–307)

## 2024-05-18 NOTE — Progress Notes (Signed)
 Patient has no new or acute concerns at this time.

## 2024-05-18 NOTE — Progress Notes (Signed)
 Hematology/Oncology Consult note [facility]  [phone] [phone]  Patient Care Team: McDonough, Lauren K, PA-C as PCP - General (Physician Assistant)   Name of the patient: Tammy Lam  [id number]  1956/01/18   Date of visit: 05/18/24  Diagnosis- heterozygosity for H63D mutation Elevated ferritin of unclear etiology  Chief complaint/ Reason for visit-routine follow-up of elevated ferritin  Heme/Onc history: Patient is a 68 year old [place] female.  History obtained with the help of [place] interpreter.  She was diagnosed with heterozygosity for H63D back in April 2017.  She has had mildly elevated ferritin that fluctuates between 400-500 since then.  Iron saturation is mainly remain between 30 to 45% but never higher.  Right upper quadrant ultrasound in June 2020 showed possible fatty infiltration with potential 3.1 cm mass lesion versus area of focal sparing.  She had a liver MRI in July 2020 which showed moderate to severe hepatic steatosis.  Focal fatty sparing in the central left lobe corresponding to the lesion seen on ultrasound.  No evidence of abnormal iron deposition was seen.  Hepatitis B and C serologies were negative in May 2020.  AFP has been within normal limits so far.  She was seen again in April 2022 and underwent 1 session of phlebotomy.  She is a nonalcoholic.  Seen by GI Dr. Therisa     Interval history-history obtained with the help of [place] interpreter.  Overall patient is doing well and denies any specific complaints at this time.  No recent hospitalizations.  ECOG PS- 1 Pain scale- 0   Review of systems- Review of Systems  Constitutional:  Negative for chills, fever, malaise/fatigue and weight loss.  HENT:  Negative for congestion, ear discharge and nosebleeds.   Eyes:  Negative for blurred vision.  Respiratory:  Negative for cough, hemoptysis, sputum production, shortness of breath and wheezing.    Cardiovascular:  Negative for chest pain, palpitations, orthopnea and claudication.  Gastrointestinal:  Negative for abdominal pain, blood in stool, constipation, diarrhea, heartburn, melena, nausea and vomiting.  Genitourinary:  Negative for dysuria, flank pain, frequency, hematuria and urgency.  Musculoskeletal:  Negative for back pain, joint pain and myalgias.  Skin:  Negative for rash.  Neurological:  Negative for dizziness, tingling, focal weakness, seizures, weakness and headaches.  Endo/Heme/Allergies:  Does not bruise/bleed easily.  Psychiatric/Behavioral:  Negative for depression and suicidal ideas. The patient does not have insomnia.       Allergies  Allergen Reactions   Diphenhydramine Other (See Comments)   Benadryl [Diphenhydramine Hcl] Itching   Influenza Vaccines    Penicillins Itching   Tricor  [Fenofibrate ]      Past Medical History:  Diagnosis Date   Diabetes mellitus without complication (HCC)    Elevated ferritin    Headache    Hemochromatosis    Hyperlipidemia    Hyponatremia    Medical history non-contributory    Shoulder injury      Past Surgical History:  Procedure Laterality Date   ANKLE SURGERY Right    COLONOSCOPY N/A 12/07/2023   Procedure: COLONOSCOPY;  Surgeon: Therisa Bi, MD;  Location: [facility] ENDOSCOPY;  Service: Gastroenterology;  Laterality: N/A;  [place]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    Social History   Socioeconomic History   Marital status: Married    Spouse name: Not on file   Number of children: Not on file   Years of education: Not on file   Highest education level: Not on file  Occupational History   Not on file  Tobacco Use   Smoking status: Never   Smokeless tobacco: Never  Vaping Use   Vaping status: Never Used  Substance and Sexual Activity   Alcohol use: No    Alcohol/week: 0.0 standard drinks of alcohol   Drug use: No   Sexual activity: Yes    Partners: Male  Other Topics Concern   Not on file  Social History Narrative    Not on file   Social Drivers of Health   Financial Resource Strain: Not on file  Food Insecurity: No Food Insecurity (05/18/2024)   Hunger Vital Sign    Worried About Running Out of Food in the Last Year: Never true    Ran Out of Food in the Last Year: Never true  Transportation Needs: No Transportation Needs (05/18/2024)   PRAPARE - Administrator, Civil Service (Medical): No    Lack of Transportation (Non-Medical): No  Physical Activity: Not on file  Stress: Not on file  Social Connections: Not on file  Intimate Partner Violence: Not At Risk (05/18/2024)   Humiliation, Afraid, Rape, and Kick questionnaire    Fear of Current or Ex-Partner: No    Emotionally Abused: No    Physically Abused: No    Sexually Abused: No    Family History  Problem Relation Age of Onset   Liver disease Other    Stroke Mother    Breast cancer Neg Hx      Current Outpatient Medications:    levothyroxine  (SYNTHROID ) 50 MCG tablet, Take 1 tablet (50 mcg total) by mouth daily., Disp: 90 tablet, Rfl: 3   metFORMIN  (GLUCOPHAGE ) 500 MG tablet, Take 1 tablet by mouth once daily with breakfast, Disp: 90 tablet, Rfl: 0   rosuvastatin  (CRESTOR ) 20 MG tablet, Take 1 tablet by mouth once daily, Disp: 90 tablet, Rfl: 0   Vitamin D , Ergocalciferol , (DRISDOL ) 1.25 MG (50000 UNIT) CAPS capsule, Take 1 capsule by mouth once a week, Disp: 12 capsule, Rfl: 0   benzonatate  (TESSALON ) 100 MG capsule, Take 1 capsule (100 mg total) by mouth 2 (two) times daily as needed for cough. (Patient not taking: Reported on 05/18/2024), Disp: 20 capsule, Rfl: 0   Blood Glucose Monitoring Suppl DEVI, 1 each by Does not apply route in the morning, at noon, and at bedtime. May substitute to any manufacturer covered by patient's insurance. (Patient not taking: Reported on 05/18/2024), Disp: 1 each, Rfl: 0   ezetimibe  (ZETIA ) 10 MG tablet, Take 1 tablet (10 mg total) by mouth daily., Disp: 90 tablet, Rfl: 3   meloxicam  (MOBIC ) 15 MG  tablet, , Disp: , Rfl:   Physical exam:  Vitals:   05/18/24 0959  BP: 116/79  Pulse: 61  Resp: 19  Temp: (!) 96.8 F (36 C)  TempSrc: Tympanic  SpO2: 98%  Weight: 139 lb 14.4 oz (63.5 kg)  Height: 5' 5 (1.651 m)   Physical Exam Cardiovascular:     Rate and Rhythm: Normal rate and regular rhythm.     Heart sounds: Normal heart sounds.  Pulmonary:     Effort: Pulmonary effort is normal.     Breath sounds: Normal breath sounds.  Skin:    General: Skin is warm and dry.  Neurological:     Mental Status: She is alert and oriented to person, place, and time.      I have personally reviewed labs listed below:    Latest Ref Rng & Units 05/18/2024    9:24 AM  CMP  Glucose 70 -  99 mg/dL 96   BUN 8 - 23 mg/dL 13   Creatinine 9.55 - 1.00 mg/dL 9.32   Sodium 864 - 854 mmol/L 137   Potassium 3.5 - 5.1 mmol/L 3.8   Chloride 98 - 111 mmol/L 105   CO2 22 - 32 mmol/L 23   Calcium  8.9 - 10.3 mg/dL 9.0   Total Protein 6.5 - 8.1 g/dL 6.8   Total Bilirubin 0.0 - 1.2 mg/dL 0.9   Alkaline Phos 38 - 126 U/L 111   AST 15 - 41 U/L 25   ALT 0 - 44 U/L 16       Latest Ref Rng & Units 05/18/2024    9:24 AM  CBC  WBC 4.0 - 10.5 K/uL 5.1   Hemoglobin 12.0 - 15.0 g/dL 85.2   Hematocrit 63.9 - 46.0 % 43.2   Platelets 150 - 400 K/uL 145     Assessment and plan- Patient is a 68 y.o. female with history of heterozygosity for H63D here for a routine follow-up  LFTs are presently within normal limits.  Patient has mild elevation of ferritin which has remained in the 300s over the last 2 years and no clear rising trend.  No evidence of iron overload.  I will continue to see her on a yearly basis.  I will get an ultrasound abdomen prior to her next visit with me.  Her last ultrasound in September 2024 showed fatty liver but no evidence of cirrhosis   Visit Diagnosis 1. 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 Elevated ferritin      Dr. Annah Skene, MD, MPH [facility] [phone] 05/18/2024 12:44 PM

## 2024-05-21 ENCOUNTER — Telehealth: Payer: Self-pay

## 2024-05-21 NOTE — Telephone Encounter (Signed)
 Lmom to call us  back returning pt husband call he lmom

## 2024-05-28 DIAGNOSIS — E782 Mixed hyperlipidemia: Secondary | ICD-10-CM | POA: Diagnosis not present

## 2024-05-28 NOTE — Progress Notes (Signed)
 Tammy Lam                                          MRN: [id number]   05/28/2024   The [facility] Quality Team Specialist reviewed this patient medical record for the purposes of chart review for care gap closure. The following were reviewed: chart review for care gap closure-diabetic eye exam and kidney health evaluation for diabetes:eGFR  and uACR.    [facility] Quality Team

## 2024-05-29 LAB — LIPID PANEL WITH LDL/HDL RATIO
Cholesterol, Total: 237 mg/dL — ABNORMAL HIGH (ref 100–199)
HDL: 39 mg/dL — ABNORMAL LOW (ref >39)
LDL Chol Calc (NIH): 111 mg/dL — ABNORMAL HIGH (ref 0–99)
LDL/HDL Ratio: 2.8 ratio (ref 0.0–3.2)
Triglycerides: 499 mg/dL — ABNORMAL HIGH (ref 0–149)
VLDL Cholesterol Cal: 87 mg/dL — ABNORMAL HIGH (ref 5–40)

## 2024-05-30 ENCOUNTER — Ambulatory Visit: Payer: Self-pay | Admitting: Physician Assistant

## 2024-06-04 DIAGNOSIS — H524 Presbyopia: Secondary | ICD-10-CM | POA: Diagnosis not present

## 2024-06-07 ENCOUNTER — Encounter: Payer: Self-pay | Admitting: Physician Assistant

## 2024-06-07 ENCOUNTER — Ambulatory Visit (INDEPENDENT_AMBULATORY_CARE_PROVIDER_SITE_OTHER): Admitting: Physician Assistant

## 2024-06-07 VITALS — BP 120/70 | HR 70 | Temp 98.3°F | Resp 16 | Ht 65.0 in | Wt 139.0 lb

## 2024-06-07 DIAGNOSIS — E782 Mixed hyperlipidemia: Secondary | ICD-10-CM | POA: Diagnosis not present

## 2024-06-07 DIAGNOSIS — E559 Vitamin D deficiency, unspecified: Secondary | ICD-10-CM | POA: Diagnosis not present

## 2024-06-07 DIAGNOSIS — R7989 Other specified abnormal findings of blood chemistry: Secondary | ICD-10-CM

## 2024-06-07 DIAGNOSIS — E1165 Type 2 diabetes mellitus with hyperglycemia: Secondary | ICD-10-CM | POA: Diagnosis not present

## 2024-06-07 LAB — POCT GLYCOSYLATED HEMOGLOBIN (HGB A1C): Hemoglobin A1C: 5.9 % — AB (ref 4.0–5.6)

## 2024-06-07 NOTE — Progress Notes (Signed)
 [facility] [address] [place], [place] [address]  Internal MEDICINE  Office Visit Note  Patient Name: Tammy Lam  [id number]  [id number]  Date of Service: 06/07/2024  Chief Complaint  Patient presents with   Follow-up   Diabetes   Hyperlipidemia    HPI Pt is here for routine follow up -Was off crestor  for 1.5 months when she had covid, just restarted 2 weeks ago -actually stopped all meds for this time, due to being on meds while sick -not eating fried foods -does see Dr. Melanee still for hemochromatosis but will be seen on annual basis with liver US  -fatty liver, but no cirrhosis -eye exam 2 weeks ago and will bring copy  Current Medication: Outpatient Encounter Medications as of 06/07/2024  Medication Sig   Blood Glucose Monitoring Suppl DEVI 1 each by Does not apply route in the morning, at noon, and at bedtime. May substitute to any manufacturer covered by patient's insurance.   ezetimibe  (ZETIA ) 10 MG tablet Take 1 tablet (10 mg total) by mouth daily.   levothyroxine  (SYNTHROID ) 50 MCG tablet Take 1 tablet (50 mcg total) by mouth daily.   metFORMIN  (GLUCOPHAGE ) 500 MG tablet Take 1 tablet by mouth once daily with breakfast   rosuvastatin  (CRESTOR ) 20 MG tablet Take 1 tablet by mouth once daily   Vitamin D , Ergocalciferol , (DRISDOL ) 1.25 MG (50000 UNIT) CAPS capsule Take 1 capsule by mouth once a week   [DISCONTINUED] benzonatate  (TESSALON ) 100 MG capsule Take 1 capsule (100 mg total) by mouth 2 (two) times daily as needed for cough.   [DISCONTINUED] meloxicam  (MOBIC ) 15 MG tablet    No facility-administered encounter medications on file as of 06/07/2024.    Surgical History: Past Surgical History:  Procedure Laterality Date   ANKLE SURGERY Right    COLONOSCOPY N/A 12/07/2023   Procedure: COLONOSCOPY;  Surgeon: Therisa Bi, MD;  Location: [facility] ENDOSCOPY;  Service: Gastroenterology;  Laterality: N/A;  VIETNAM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    Medical History: Past  Medical History:  Diagnosis Date   Diabetes mellitus without complication (HCC)    Elevated ferritin    Headache    Hemochromatosis    Hyperlipidemia    Hyponatremia    Medical history non-contributory    Shoulder injury     Family History: Family History  Problem Relation Age of Onset   Liver disease Other    Stroke Mother    Breast cancer Neg Hx     Social History   Socioeconomic History   Marital status: Married    Spouse name: Not on file   Number of children: Not on file   Years of education: Not on file   Highest education level: Not on file  Occupational History   Not on file  Tobacco Use   Smoking status: Never   Smokeless tobacco: Never  Vaping Use   Vaping status: Never Used  Substance and Sexual Activity   Alcohol use: No    Alcohol/week: 0.0 standard drinks of alcohol   Drug use: No   Sexual activity: Yes    Partners: Male  Other Topics Concern   Not on file  Social History Narrative   Not on file   Social Drivers of Health   Financial Resource Strain: Not on file  Food Insecurity: No Food Insecurity (05/18/2024)   Hunger Vital Sign    Worried About Running Out of Food in the Last Year: Never true    Ran Out of Food in the Last Year: Never  true  Transportation Needs: No Transportation Needs (05/18/2024)   PRAPARE - Administrator, Civil Service (Medical): No    Lack of Transportation (Non-Medical): No  Physical Activity: Not on file  Stress: Not on file  Social Connections: Not on file  Intimate Partner Violence: Not At Risk (05/18/2024)   Humiliation, Afraid, Rape, and Kick questionnaire    Fear of Current or Ex-Partner: No    Emotionally Abused: No    Physically Abused: No    Sexually Abused: No      Review of Systems  Constitutional:  Negative for chills, fatigue and unexpected weight change.  HENT:  Negative for congestion, postnasal drip, rhinorrhea, sneezing and sore throat.   Eyes:  Negative for redness.   Respiratory:  Negative for cough, chest tightness and shortness of breath.   Cardiovascular:  Negative for chest pain and palpitations.  Gastrointestinal:  Negative for abdominal pain, constipation, diarrhea, nausea and vomiting.  Genitourinary:  Negative for dysuria and frequency.  Musculoskeletal:  Positive for arthralgias. Negative for back pain, joint swelling and neck pain.  Skin:  Negative for rash.  Neurological: Negative.  Negative for tremors and numbness.  Hematological:  Negative for adenopathy. Does not bruise/bleed easily.  Psychiatric/Behavioral:  Negative for behavioral problems (Depression), sleep disturbance and suicidal ideas. The patient is not nervous/anxious.     Vital Signs: BP 120/70   Pulse 70   Temp 98.3 F (36.8 C)   Resp 16   Ht 5' 5 (1.651 m)   Wt 139 lb (63 kg)   LMP  (LMP Unknown)   SpO2 97%   BMI 23.13 kg/m    Physical Exam Vitals and nursing note reviewed.  Constitutional:      Appearance: Normal appearance.  HENT:     Head: Normocephalic and atraumatic.  Eyes:     Extraocular Movements: Extraocular movements intact.  Cardiovascular:     Rate and Rhythm: Normal rate and regular rhythm.  Pulmonary:     Effort: Pulmonary effort is normal.     Breath sounds: Normal breath sounds.  Neurological:     General: No focal deficit present.     Mental Status: She is alert.  Psychiatric:        Mood and Affect: Mood normal.        Behavior: Behavior normal.        Assessment/Plan: 1. Type 2 diabetes mellitus with hyperglycemia, without long-term current use of insulin (HCC) (Primary) - POCT HgB A1C is 5.9 which improved from 6.0 at last check.  Continue with metformin  as before while working on diet and exercise - Urine Microalbumin w/creat. ratio  2. Mixed hyperlipidemia Still elevated on repeat labs however patient was off of Crestor  and Zetia  for a month and a half during recent COVID illness.  She has now restarted on both of these  and we will update labs to monitor.  If not improving may need to increase Crestor  - Lipid Panel With LDL/HDL Ratio  3. Elevated TSH - TSH + free T4  4. Vitamin D  deficiency - VITAMIN D  25 Hydroxy (Vit-D Deficiency, Fractures)  5. Hereditary hemochromatosis Followed by hematology on annual basis now with annual ultrasound   General Counseling: Tammy Lam verbalizes understanding of the findings of todays visit and agrees with plan of treatment. I have discussed any further diagnostic evaluation that may be needed or ordered today. We also reviewed her medications today. she has been encouraged to call the office with any questions or concerns  that should arise related to todays visit.    Orders Placed This Encounter  Procedures   Urine Microalbumin w/creat. ratio   Lipid Panel With LDL/HDL Ratio   TSH + free T4   VITAMIN D  25 Hydroxy (Vit-D Deficiency, Fractures)   POCT HgB A1C    No orders of the defined types were placed in this encounter.   This patient was seen by Tinnie Pro, PA-C in collaboration with Dr. Sigrid Bathe as a part of collaborative care agreement.   Total time spent:30 Minutes Time spent includes review of chart, medications, test results, and follow up plan with the patient.      Dr Fozia M Khan Internal medicine

## 2024-06-08 LAB — MICROALBUMIN / CREATININE URINE RATIO
Creatinine, Urine: 110.2 mg/dL
Microalb/Creat Ratio: 7 mg/g{creat} (ref 0–29)
Microalbumin, Urine: 7.6 ug/mL

## 2024-06-25 ENCOUNTER — Telehealth: Payer: Self-pay | Admitting: Oncology

## 2024-06-25 NOTE — Telephone Encounter (Signed)
 Patient and her husband came to my desk to ask about US  - I scheduled it for next year and gave them directions and print out.

## 2024-09-04 ENCOUNTER — Other Ambulatory Visit
Admission: RE | Admit: 2024-09-04 | Discharge: 2024-09-04 | Disposition: A | Discharge status: Home / Self Care | Attending: Physician Assistant | Admitting: Physician Assistant

## 2024-09-04 DIAGNOSIS — E782 Mixed hyperlipidemia: Secondary | ICD-10-CM | POA: Diagnosis not present

## 2024-09-04 DIAGNOSIS — E559 Vitamin D deficiency, unspecified: Secondary | ICD-10-CM | POA: Diagnosis present

## 2024-09-04 DIAGNOSIS — R7989 Other specified abnormal findings of blood chemistry: Secondary | ICD-10-CM | POA: Insufficient documentation

## 2024-09-04 LAB — LIPID PANEL
Cholesterol: 225 mg/dL — ABNORMAL HIGH (ref 0–200)
HDL: 50 mg/dL
LDL Cholesterol: UNDETERMINED mg/dL (ref 0–99)
Total CHOL/HDL Ratio: 4.5 ratio
Triglycerides: 431 mg/dL — ABNORMAL HIGH
VLDL: 86 mg/dL — ABNORMAL HIGH (ref 0–40)

## 2024-09-04 LAB — VITAMIN D 25 HYDROXY (VIT D DEFICIENCY, FRACTURES): Vit D, 25-Hydroxy: 34 ng/mL (ref 30–100)

## 2024-09-04 LAB — T4, FREE: Free T4: 0.93 ng/dL (ref 0.80–2.00)

## 2024-09-04 LAB — TSH: TSH: 4.28 u[IU]/mL (ref 0.350–4.500)

## 2024-09-05 LAB — LDL CHOLESTEROL, DIRECT: Direct LDL: 94 mg/dL (ref 0–99)

## 2024-09-20 ENCOUNTER — Ambulatory Visit (INDEPENDENT_AMBULATORY_CARE_PROVIDER_SITE_OTHER): Payer: Medicare HMO | Admitting: Physician Assistant

## 2024-09-20 ENCOUNTER — Encounter: Payer: Self-pay | Admitting: Physician Assistant

## 2024-09-20 VITALS — BP 110/60 | HR 68 | Temp 98.0°F | Resp 16 | Ht 65.0 in | Wt 145.6 lb

## 2024-09-20 DIAGNOSIS — R7989 Other specified abnormal findings of blood chemistry: Secondary | ICD-10-CM

## 2024-09-20 DIAGNOSIS — E119 Type 2 diabetes mellitus without complications: Secondary | ICD-10-CM | POA: Diagnosis not present

## 2024-09-20 DIAGNOSIS — R300 Dysuria: Secondary | ICD-10-CM

## 2024-09-20 DIAGNOSIS — Z1231 Encounter for screening mammogram for malignant neoplasm of breast: Secondary | ICD-10-CM | POA: Diagnosis not present

## 2024-09-20 DIAGNOSIS — Z0001 Encounter for general adult medical examination with abnormal findings: Secondary | ICD-10-CM | POA: Diagnosis not present

## 2024-09-20 DIAGNOSIS — E782 Mixed hyperlipidemia: Secondary | ICD-10-CM | POA: Diagnosis not present

## 2024-09-20 MED ORDER — METFORMIN HCL 500 MG PO TABS: 500.0000 mg | ORAL_TABLET | Freq: Every day | ORAL | 3 refills | Status: AC

## 2024-09-20 MED ORDER — ROSUVASTATIN CALCIUM 20 MG PO TABS: 20.0000 mg | ORAL_TABLET | Freq: Every day | ORAL | 3 refills | Status: AC

## 2024-09-20 MED ORDER — LEVOTHYROXINE SODIUM 50 MCG PO TABS: 50.0000 ug | ORAL_TABLET | Freq: Every day | ORAL | 3 refills | Status: AC

## 2024-09-20 NOTE — Progress Notes (Signed)
 [facility] [address] [place], [place] [address]  Internal MEDICINE  Office Visit Note  Patient Name: Tammy Lam  [id number]  [id number]  Date of Service: 09/20/2024  Chief Complaint  Patient presents with   Medicare Wellness    Review Labs   Diabetes   Hyperlipidemia    HPI Tammy Lam presents for an annual well visit, her husband is present and aids in translation Well-appearing 69 y.o.female  Routine CRC screening: UTD Routine mammogram: Due in March Eye exam and/or foot exam: eye exam with Dr. Carolee last Nov, foot exam done today Labs: cholesterol improving some, but TG still elevated. Vit D improved but still low normal--will try OTC. Thyroid  dropped some since running out of med 1 month ago Other concerns: only taking crestor , felt tired when taking with zetia  so stopped.      09/20/2024   10:01 AM 09/16/2023    9:58 AM 08/19/2022    9:53 AM  MMSE - Mini Mental State Exam  Not completed:  Unable to complete Unable to complete  Orientation to time 5    Orientation to Place 5    Registration 3    Attention/ Calculation 5    Recall 3    Language- name 2 objects 2    Language- repeat 1    Language- follow 3 step command 3    Language- read & follow direction 1    Write a sentence 1    Copy design 1    Total score 30      Functional Status Survey: Is the patient deaf or have difficulty hearing?: No Does the patient have difficulty seeing, even when wearing glasses/contacts?: No Does the patient have difficulty concentrating, remembering, or making decisions?: No Does the patient have difficulty walking or climbing stairs?: No Does the patient have difficulty dressing or bathing?: No Does the patient have difficulty doing errands alone such as visiting a doctor's office or shopping?: Yes     01/24/2023    9:01 AM 04/07/2023    9:14 AM 09/16/2023    9:57 AM 06/07/2024    9:23 AM 09/20/2024   10:01 AM  Fall Risk  Falls in the past year? 0 0 0 0 0        09/20/2024   10:01 AM  Depression screen PHQ 2/9  Decreased Interest 0  Down, Depressed, Hopeless 0  PHQ - 2 Score 0        No data to display            Current Medication: Outpatient Encounter Medications as of 09/20/2024  Medication Sig   Blood Glucose Monitoring Suppl DEVI 1 each by Does not apply route in the morning, at noon, and at bedtime. May substitute to any manufacturer covered by patient's insurance.   [DISCONTINUED] ezetimibe  (ZETIA ) 10 MG tablet Take 1 tablet (10 mg total) by mouth daily.   [DISCONTINUED] levothyroxine  (SYNTHROID ) 50 MCG tablet Take 1 tablet (50 mcg total) by mouth daily.   [DISCONTINUED] metFORMIN  (GLUCOPHAGE ) 500 MG tablet Take 1 tablet by mouth once daily with breakfast   [DISCONTINUED] rosuvastatin  (CRESTOR ) 20 MG tablet Take 1 tablet by mouth once daily   [DISCONTINUED] Vitamin D , Ergocalciferol , (DRISDOL ) 1.25 MG (50000 UNIT) CAPS capsule Take 1 capsule by mouth once a week   levothyroxine  (SYNTHROID ) 50 MCG tablet Take 1 tablet (50 mcg total) by mouth daily.   metFORMIN  (GLUCOPHAGE ) 500 MG tablet Take 1 tablet (500 mg total) by mouth daily with breakfast.  rosuvastatin  (CRESTOR ) 20 MG tablet Take 1 tablet (20 mg total) by mouth daily.   No facility-administered encounter medications on file as of 09/20/2024.    Surgical History: Past Surgical History:  Procedure Laterality Date   ANKLE SURGERY Right    COLONOSCOPY N/A 12/07/2023   Procedure: COLONOSCOPY;  Surgeon: Therisa Bi, MD;  Location: [facility] ENDOSCOPY;  Service: Gastroenterology;  Laterality: N/A;  VIETNAM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    Medical History: Past Medical History:  Diagnosis Date   Diabetes mellitus without complication (HCC)    Elevated ferritin    Headache    Hemochromatosis    Hyperlipidemia    Hyponatremia    Medical history non-contributory    Shoulder injury     Family History: Family History  Problem Relation Age of Onset   Liver disease Other    Stroke Mother     Breast cancer Neg Hx     Social History   Socioeconomic History   Marital status: Married    Spouse name: Not on file   Number of children: Not on file   Years of education: Not on file   Highest education level: Not on file  Occupational History   Not on file  Tobacco Use   Smoking status: Never   Smokeless tobacco: Never  Vaping Use   Vaping status: Never Used  Substance and Sexual Activity   Alcohol use: No    Alcohol/week: 0.0 standard drinks of alcohol   Drug use: No   Sexual activity: Yes    Partners: Male  Other Topics Concern   Not on file  Social History Narrative   Not on file   Social Drivers of Health   Tobacco Use: Low Risk (09/20/2024)   Patient History    Smoking Tobacco Use: Never    Smokeless Tobacco Use: Never    Passive Exposure: Not on file  Financial Resource Strain: Not on file  Food Insecurity: No Food Insecurity (05/18/2024)   Epic    Worried About Programme Researcher, Broadcasting/film/video in the Last Year: Never true    Ran Out of Food in the Last Year: Never true  Transportation Needs: No Transportation Needs (05/18/2024)   Epic    Lack of Transportation (Medical): No    Lack of Transportation (Non-Medical): No  Physical Activity: Not on file  Stress: Not on file  Social Connections: Not on file  Intimate Partner Violence: Not At Risk (05/18/2024)   Epic    Fear of Current or Ex-Partner: No    Emotionally Abused: No    Physically Abused: No    Sexually Abused: No  Depression (PHQ2-9): Low Risk (09/20/2024)   Depression (PHQ2-9)    PHQ-2 Score: 0  Alcohol Screen: Low Risk (02/08/2022)   Alcohol Screen    Last Alcohol Screening Score (AUDIT): 0  Housing: Low Risk (05/18/2024)   Epic    Unable to Pay for Housing in the Last Year: No    Number of Times Moved in the Last Year: 0    Homeless in the Last Year: No  Utilities: Not At Risk (05/18/2024)   Epic    Threatened with loss of utilities: No  Health Literacy: Not on file      Review of Systems   Constitutional:  Negative for chills, fatigue and unexpected weight change.  HENT:  Negative for congestion, postnasal drip, rhinorrhea, sneezing and sore throat.   Eyes:  Negative for redness.  Respiratory:  Negative for cough, chest tightness and shortness of breath.  Cardiovascular:  Negative for chest pain and palpitations.  Gastrointestinal:  Negative for abdominal pain, constipation, diarrhea, nausea and vomiting.  Genitourinary:  Negative for dysuria and frequency.  Musculoskeletal:  Positive for arthralgias. Negative for back pain, joint swelling and neck pain.  Skin:  Negative for rash.  Neurological: Negative.  Negative for tremors and numbness.  Hematological:  Negative for adenopathy. Does not bruise/bleed easily.  Psychiatric/Behavioral:  Negative for behavioral problems (Depression), sleep disturbance and suicidal ideas. The patient is not nervous/anxious.     Vital Signs: BP 110/60   Pulse 68   Temp 98 F (36.7 C)   Resp 16   Ht 5' 5 (1.651 m)   Wt 145 lb 9.6 oz (66 kg)   LMP  (LMP Unknown)   SpO2 97%   BMI 24.23 kg/m    Physical Exam Vitals and nursing note reviewed.  Constitutional:      Appearance: Normal appearance.  HENT:     Head: Normocephalic and atraumatic.  Eyes:     Extraocular Movements: Extraocular movements intact.  Cardiovascular:     Rate and Rhythm: Normal rate and regular rhythm.     Pulses:          Dorsalis pedis pulses are 2+ on the right side and 2+ on the left side.       Posterior tibial pulses are 2+ on the right side and 2+ on the left side.  Pulmonary:     Effort: Pulmonary effort is normal.     Breath sounds: Normal breath sounds.  Musculoskeletal:     Right foot: Normal range of motion.     Left foot: Normal range of motion.  Feet:     Right foot:     Protective Sensation: 2 sites tested.  2 sites sensed.     Skin integrity: Dry skin present.     Toenail Condition: Right toenails are normal.     Left foot:      Protective Sensation: 2 sites tested.  2 sites sensed.     Skin integrity: Dry skin present.     Toenail Condition: Left toenails are normal.  Skin:    General: Skin is warm and dry.  Neurological:     General: No focal deficit present.     Mental Status: She is alert.  Psychiatric:        Mood and Affect: Mood normal.        Behavior: Behavior normal.        Assessment/Plan: 1. Encounter for Medicare annual examination with abnormal findings (Primary) AWV performed, labs reviewed, colonoscopy UTD, mammogram due in March  2. Type 2 diabetes mellitus without complication, without long-term current use of insulin (HCC) Will continue metformin  and will order A1c for monitoring - metFORMIN  (GLUCOPHAGE ) 500 MG tablet; Take 1 tablet (500 mg total) by mouth daily with breakfast.  Dispense: 90 tablet; Refill: 3 - Hgb A1C w/o eAG  3. Mixed hyperlipidemia Continue crestor  daily. Advised on diet and exercise to also help continue to bring this down. Reports feeling too tired when taking in conjunction with zetia  - rosuvastatin  (CRESTOR ) 20 MG tablet; Take 1 tablet (20 mg total) by mouth daily.  Dispense: 90 tablet; Refill: 3  4. Elevated TSH Will reorder thyroid  for monitoring - levothyroxine  (SYNTHROID ) 50 MCG tablet; Take 1 tablet (50 mcg total) by mouth daily.  Dispense: 90 tablet; Refill: 3 - TSH + free T4  5. Visit for screening mammogram - MM 3D SCREENING MAMMOGRAM BILATERAL BREAST;  Future  6. Dysuria - UA/M w/rflx Culture, Routine     General Counseling: Lache verbalizes understanding of the findings of todays visit and agrees with plan of treatment. I have discussed any further diagnostic evaluation that may be needed or ordered today. We also reviewed her medications today. she has been encouraged to call the office with any questions or concerns that should arise related to todays visit.    Orders Placed This Encounter  Procedures   MM 3D SCREENING MAMMOGRAM BILATERAL  BREAST   UA/M w/rflx Culture, Routine   TSH + free T4   Hgb A1C w/o eAG    Meds ordered this encounter  Medications   metFORMIN  (GLUCOPHAGE ) 500 MG tablet    Sig: Take 1 tablet (500 mg total) by mouth daily with breakfast.    Dispense:  90 tablet    Refill:  3   rosuvastatin  (CRESTOR ) 20 MG tablet    Sig: Take 1 tablet (20 mg total) by mouth daily.    Dispense:  90 tablet    Refill:  3   levothyroxine  (SYNTHROID ) 50 MCG tablet    Sig: Take 1 tablet (50 mcg total) by mouth daily.    Dispense:  90 tablet    Refill:  3    Please see dose change    Return in about 6 months (around 03/20/2025).   Total time spent:35 Minutes Time spent includes review of chart, medications, test results, and follow up plan with the patient.   [place] Controlled Substance Database was reviewed by me.  This patient was seen by Tinnie Pro, PA-C in collaboration with Dr. Sigrid Bathe as a part of collaborative care agreement.  Tinnie Pro, PA-C Internal medicine

## 2024-09-23 LAB — URINE CULTURE, REFLEX

## 2024-09-23 LAB — UA/M W/RFLX CULTURE, ROUTINE
Bilirubin, UA: NEGATIVE
Glucose, UA: NEGATIVE
Ketones, UA: NEGATIVE
Nitrite, UA: NEGATIVE
Protein,UA: NEGATIVE
RBC, UA: NEGATIVE
Specific Gravity, UA: 1.011 (ref 1.005–1.030)
Urobilinogen, Ur: 0.2 mg/dL (ref 0.2–1.0)
pH, UA: 6 (ref 5.0–7.5)

## 2024-09-23 LAB — MICROSCOPIC EXAMINATION
Bacteria, UA: NONE SEEN
Casts: NONE SEEN /LPF
RBC, Urine: NONE SEEN /HPF (ref 0–2)

## 2024-11-12 ENCOUNTER — Encounter

## 2025-03-21 ENCOUNTER — Ambulatory Visit: Admitting: Physician Assistant

## 2025-05-10 ENCOUNTER — Other Ambulatory Visit

## 2025-05-17 ENCOUNTER — Other Ambulatory Visit

## 2025-05-17 ENCOUNTER — Ambulatory Visit: Admitting: Oncology

## 2025-09-23 ENCOUNTER — Ambulatory Visit: Admitting: Physician Assistant
